# Patient Record
Sex: Male | Born: 1985
Health system: Southern US, Community
[De-identification: ages and names within clinical notes are randomized; demographics above are authoritative.]

## PROBLEM LIST (undated history)

## (undated) DIAGNOSIS — Z21 Asymptomatic human immunodeficiency virus [HIV] infection status: Secondary | ICD-10-CM

## (undated) DIAGNOSIS — Z9289 Personal history of other medical treatment: Secondary | ICD-10-CM

## (undated) DIAGNOSIS — B20 Human immunodeficiency virus [HIV] disease: Secondary | ICD-10-CM

## (undated) DIAGNOSIS — D649 Anemia, unspecified: Secondary | ICD-10-CM

## (undated) DIAGNOSIS — D638 Anemia in other chronic diseases classified elsewhere: Secondary | ICD-10-CM

## (undated) HISTORY — PX: ORIF FINGER / THUMB FRACTURE: SUR932

## (undated) HISTORY — PX: WRIST FRACTURE SURGERY: SHX121

## (undated) HISTORY — PX: FRACTURE SURGERY: SHX138

---

## 2016-03-02 ENCOUNTER — Emergency Department (INDEPENDENT_AMBULATORY_CARE_PROVIDER_SITE_OTHER)
Admission: EM | Admit: 2016-03-02 | Discharge: 2016-03-02 | Disposition: A | Discharge status: Home / Self Care | Payer: Self-pay | Source: Home / Self Care | Attending: Emergency Medicine | Admitting: Emergency Medicine

## 2016-03-02 ENCOUNTER — Encounter (HOSPITAL_COMMUNITY): Payer: Self-pay

## 2016-03-02 DIAGNOSIS — J40 Bronchitis, not specified as acute or chronic: Secondary | ICD-10-CM

## 2016-03-02 DIAGNOSIS — R05 Cough: Secondary | ICD-10-CM

## 2016-03-02 DIAGNOSIS — R059 Cough, unspecified: Secondary | ICD-10-CM

## 2016-03-02 MED ORDER — HYDROCODONE-HOMATROPINE 5-1.5 MG/5ML PO SYRP
5.0000 mL | ORAL_SOLUTION | Freq: Four times a day (QID) | ORAL | Status: DC | PRN
Start: 2016-03-02 — End: 2017-09-25

## 2016-03-02 MED ORDER — PREDNISONE 5 MG (21) PO TBPK: 5.0000 mg | ORAL_TABLET | Freq: Every day | ORAL | Status: DC

## 2016-03-02 MED ORDER — PREDNISOLONE 5 MG (21) PO TBPK: 5.0000 mg | ORAL_TABLET | ORAL | Status: DC

## 2016-03-02 NOTE — ED Notes (Signed)
Patient has has been feeling bad x5 days and has had a cough x2 days with chest pain with a slight fever No medication has ben taken. No acute distress Mom at bedside

## 2016-03-02 NOTE — Discharge Instructions (Signed)
Upper Respiratory Infection, Adult Most upper respiratory infections (URIs) are caused by a virus. A URI affects the nose, throat, and upper air passages. The most common type of URI is often called "the common cold." HOME CARE   Take medicines only as told by your doctor.  Gargle warm saltwater or take cough drops to comfort your throat as told by your doctor.  Use a warm mist humidifier or inhale steam from a shower to increase air moisture. This may make it easier to breathe.  Drink enough fluid to keep your pee (urine) clear or pale yellow.  Eat soups and other clear broths.  Have a healthy diet.  Rest as needed.  Go back to work when your fever is gone or your doctor says it is okay.  You may need to stay home longer to avoid giving your URI to others.  You can also wear a face mask and wash your hands often to prevent spread of the virus.  Use your inhaler more if you have asthma.  Do not use any tobacco products, including cigarettes, chewing tobacco, or electronic cigarettes. If you need help quitting, ask your doctor. GET HELP IF:  You are getting worse, not better.  Your symptoms are not helped by medicine.  You have chills.  You are getting more short of breath.  You have brown or red mucus.  You have yellow or brown discharge from your nose.  You have pain in your face, especially when you bend forward.  You have a fever.  You have puffy (swollen) neck glands.  You have pain while swallowing.  You have white areas in the back of your throat. GET HELP RIGHT AWAY IF:   You have very bad or constant:  Headache.  Ear pain.  Pain in your forehead, behind your eyes, and over your cheekbones (sinus pain).  Chest pain.  You have long-lasting (chronic) lung disease and any of the following:  Wheezing.  Long-lasting cough.  Coughing up blood.  A change in your usual mucus.  You have a stiff neck.  You have changes in  your:  Vision.  Hearing.  Thinking.  Mood. MAKE SURE YOU:   Understand these instructions.  Will watch your condition.  Will get help right away if you are not doing well or get worse.   This information is not intended to replace advice given to you by your health care provider. Make sure you discuss any questions you have with your health care provider.  You appear to have mild bronchitis. Will treat you with cough syrup as needed for night time and during the day Delsym (which is over the counter). Also mucinex max strength will help with congestion. Drink plenty of fluids. F/U here or emergency room if worsens.   Document Released: 06/03/2008 Document Revised: 05/02/2015 Document Reviewed: 03/23/2014 Elsevier Interactive Patient Education [employer]2016 Elsevier Inc.

## 2016-03-02 NOTE — ED Provider Notes (Signed)
CSN: [id number]     Arrival date & time 03/02/16  1716 History   First MD Initiated Contact with Patient 03/02/16 1820     Chief Complaint  Patient presents with  . Cough   (Consider location/radiation/quality/duration/timing/severity/associated sxs/prior Treatment) HPI Comments: Anthony Yang is a 30 yo black male who presents with a 5-6 day history of productive cough (mucus), and low grade temp. No nasal congestion or sore throat.   Patient is a 30 y.o. male presenting with cough. The history is provided by the patient.  Cough Associated symptoms: fever   Associated symptoms: no chills, no rhinorrhea and no shortness of breath     History reviewed. No pertinent past medical history. History reviewed. No pertinent past surgical history. No family history on file. Social History  Substance Use Topics  . Smoking status: Never Smoker   . Smokeless tobacco: Never Used  . Alcohol Use: No    Review of Systems  Constitutional: Positive for fever. Negative for chills and fatigue.  HENT: Negative for congestion, rhinorrhea and sinus pressure.   Respiratory: Positive for cough. Negative for shortness of breath.   Skin: Negative.   Allergic/Immunologic: Negative.     Allergies  Penicillins  Home Medications   Prior to Admission medications   Medication Sig Start Date End Date Taking? Authorizing Provider  HYDROcodone-homatropine (HYCODAN) 5-1.5 MG/5ML syrup Take 5 mLs by mouth every 6 (six) hours as needed for cough. 03/02/16   Riki SheerMichelle G Raihana Balderrama, PA-C  predniSONE (STERAPRED UNI-PAK 21 TAB) 5 MG (21) TBPK tablet Take 1 tablet (5 mg total) by mouth daily. 03/02/16   Riki SheerMichelle G Peace Jost, PA-C   Meds Ordered and Administered this Visit  Medications - No data to display  BP 121/80 mmHg  Pulse 94  Temp(Src) 99.9 F (37.7 C) (Oral)  Resp 18  SpO2 96% No data found.   Physical Exam  Constitutional: He is oriented to person, place, and time. He appears well-developed and well-nourished. No  distress.  HENT:  Head: Normocephalic and atraumatic.  Mouth/Throat: Oropharynx is clear and moist. No oropharyngeal exudate.  Eyes: Pupils are equal, round, and reactive to light.  Neck: Normal range of motion.  Pulmonary/Chest: Effort normal. He has wheezes.  Very mild expiratory wheezes in the bases. No crackles or rales  Neurological: He is alert and oriented to person, place, and time.  Skin: Skin is warm and dry. No rash noted. He is not diaphoretic.  Psychiatric: His behavior is normal.  Nursing note and vitals reviewed.   ED Course  Procedures (including critical care time)  Labs Review Labs Reviewed - No data to display  Imaging Review No results found.   Visual Acuity Review  Right Eye Distance:   Left Eye Distance:   Bilateral Distance:    Right Eye Near:   Left Eye Near:    Bilateral Near:         MDM   1. Bronchitis   2. Cough   Probable mild bronchitis is noted. No antibiotic indicated in a Anthony Yang otherwise healthy non-smoker. Treat with 6 day pred pack, cough medications and fluids. F/U here or ED if worsens.     Riki SheerMichelle G Sakiya Stepka, PA-C 03/02/16 1905

## 2017-09-22 ENCOUNTER — Ambulatory Visit (HOSPITAL_COMMUNITY)
Admission: EM | Admit: 2017-09-22 | Discharge: 2017-09-22 | Disposition: A | Discharge status: Home / Self Care | Payer: Medicaid Other | Attending: Family Medicine | Admitting: Family Medicine

## 2017-09-22 ENCOUNTER — Encounter (HOSPITAL_COMMUNITY): Payer: Self-pay | Admitting: Emergency Medicine

## 2017-09-22 DIAGNOSIS — Z880 Allergy status to penicillin: Secondary | ICD-10-CM | POA: Insufficient documentation

## 2017-09-22 DIAGNOSIS — R5383 Other fatigue: Secondary | ICD-10-CM

## 2017-09-22 DIAGNOSIS — R634 Abnormal weight loss: Secondary | ICD-10-CM | POA: Diagnosis not present

## 2017-09-22 DIAGNOSIS — R630 Anorexia: Secondary | ICD-10-CM

## 2017-09-22 LAB — TSH: TSH: 1.988 u[IU]/mL (ref 0.350–4.500)

## 2017-09-22 LAB — GLUCOSE, CAPILLARY: Glucose-Capillary: 109 mg/dL — ABNORMAL HIGH (ref 65–99)

## 2017-09-22 NOTE — ED Provider Notes (Signed)
  [facility]   [id number] 09/22/17 Arrival Time: 1110  ASSESSMENT & PLAN:  1. Fatigue, unspecified type   2. Decreased appetite    He feels symptoms are improving now that he can get regular sleep. Will f/u if he does not continue to improve. TSH pending.  Reviewed expectations re: course of current medical issues. Questions answered. Outlined signs and symptoms indicating need for more acute intervention. Patient verbalized understanding. After Visit Summary given.   SUBJECTIVE:  Anthony Yang is a 31 y.o. male who reports fatigue and feeling week for the past 2 months. 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 Is quitting job to work part time now. This week has been the first he's been on a normal schedule. Sleeping better and feels energy returning somewhat. Also reports decreased appetite over the past 1-2 months and weight loss. Appetite slowly returning now that he's rested. Reports DM runs in family. Desires "lab work."  ROS: As per HPI.   OBJECTIVE:  Vitals:   09/22/17 1252  BP: 119/73  Pulse: (!) 113  Resp: 20  Temp: 98.3 F (36.8 C)  TempSrc: Oral  SpO2: 100%    Recheck Pulse: 92  General appearance: alert; no distress Lungs: clear to auscultation bilaterally Heart: regular rate and rhythm Abdomen: soft, non-tender Skin: warm and dry Psychological: alert and cooperative; normal mood and affect  Labs: Labs Reviewed  GLUCOSE, CAPILLARY - Abnormal; Notable for the following:       Result Value   Glucose-Capillary 109 (*)    All other components within normal limits  TSH     Allergies  Allergen Reactions  . Penicillins Hives    Social History   Social History  . Marital status: Single    Spouse name: N/A  . Number of children: N/A  . Years of education: N/A   Occupational History  . Not on file.   Social History Main Topics  . Smoking status: Never Smoker  . Smokeless tobacco: Never Used  .  Alcohol use Yes  . Drug use: No  . Sexual activity: No   Other Topics Concern  . Not on file   Social History Narrative  . No narrative on file      Mardella Layman, MD 09/24/17 [phone]

## 2017-09-22 NOTE — ED Triage Notes (Signed)
Feeling weak and tired and says he has lost 60 pounds in 2 months due to not being able to eat and sleep normally

## 2017-09-24 ENCOUNTER — Inpatient Hospital Stay (HOSPITAL_COMMUNITY)
Admission: EM | Admit: 2017-09-24 | Discharge: 2017-10-02 | DRG: 974 | Disposition: A | Discharge status: Home / Self Care | Payer: Medicaid Other | Attending: Internal Medicine | Admitting: Internal Medicine

## 2017-09-24 ENCOUNTER — Encounter (HOSPITAL_COMMUNITY): Payer: Self-pay | Admitting: Emergency Medicine

## 2017-09-24 ENCOUNTER — Emergency Department (HOSPITAL_COMMUNITY): Payer: Medicaid Other

## 2017-09-24 DIAGNOSIS — J189 Pneumonia, unspecified organism: Secondary | ICD-10-CM

## 2017-09-24 DIAGNOSIS — E876 Hypokalemia: Secondary | ICD-10-CM | POA: Diagnosis present

## 2017-09-24 DIAGNOSIS — D638 Anemia in other chronic diseases classified elsewhere: Secondary | ICD-10-CM | POA: Diagnosis present

## 2017-09-24 DIAGNOSIS — E43 Unspecified severe protein-calorie malnutrition: Secondary | ICD-10-CM | POA: Insufficient documentation

## 2017-09-24 DIAGNOSIS — I959 Hypotension, unspecified: Secondary | ICD-10-CM | POA: Diagnosis present

## 2017-09-24 DIAGNOSIS — R109 Unspecified abdominal pain: Secondary | ICD-10-CM

## 2017-09-24 DIAGNOSIS — B20 Human immunodeficiency virus [HIV] disease: Secondary | ICD-10-CM | POA: Diagnosis present

## 2017-09-24 DIAGNOSIS — K922 Gastrointestinal hemorrhage, unspecified: Secondary | ICD-10-CM | POA: Diagnosis present

## 2017-09-24 DIAGNOSIS — Z9119 Patient's noncompliance with other medical treatment and regimen: Secondary | ICD-10-CM

## 2017-09-24 DIAGNOSIS — E871 Hypo-osmolality and hyponatremia: Secondary | ICD-10-CM | POA: Diagnosis present

## 2017-09-24 DIAGNOSIS — Z7952 Long term (current) use of systemic steroids: Secondary | ICD-10-CM

## 2017-09-24 DIAGNOSIS — D649 Anemia, unspecified: Secondary | ICD-10-CM | POA: Diagnosis present

## 2017-09-24 DIAGNOSIS — R64 Cachexia: Secondary | ICD-10-CM | POA: Diagnosis present

## 2017-09-24 DIAGNOSIS — R6521 Severe sepsis with septic shock: Secondary | ICD-10-CM | POA: Diagnosis present

## 2017-09-24 DIAGNOSIS — Z79891 Long term (current) use of opiate analgesic: Secondary | ICD-10-CM

## 2017-09-24 DIAGNOSIS — K580 Irritable bowel syndrome with diarrhea: Secondary | ICD-10-CM | POA: Diagnosis present

## 2017-09-24 DIAGNOSIS — A071 Giardiasis [lambliasis]: Secondary | ICD-10-CM

## 2017-09-24 DIAGNOSIS — D696 Thrombocytopenia, unspecified: Secondary | ICD-10-CM | POA: Diagnosis present

## 2017-09-24 DIAGNOSIS — B370 Candidal stomatitis: Secondary | ICD-10-CM

## 2017-09-24 DIAGNOSIS — B379 Candidiasis, unspecified: Secondary | ICD-10-CM | POA: Diagnosis present

## 2017-09-24 DIAGNOSIS — R000 Tachycardia, unspecified: Secondary | ICD-10-CM | POA: Diagnosis present

## 2017-09-24 DIAGNOSIS — R509 Fever, unspecified: Secondary | ICD-10-CM

## 2017-09-24 DIAGNOSIS — R0682 Tachypnea, not elsewhere classified: Secondary | ICD-10-CM | POA: Diagnosis present

## 2017-09-24 DIAGNOSIS — Z6822 Body mass index (BMI) 22.0-22.9, adult: Secondary | ICD-10-CM

## 2017-09-24 DIAGNOSIS — A310 Pulmonary mycobacterial infection: Secondary | ICD-10-CM | POA: Diagnosis present

## 2017-09-24 DIAGNOSIS — E861 Hypovolemia: Secondary | ICD-10-CM | POA: Diagnosis present

## 2017-09-24 DIAGNOSIS — K649 Unspecified hemorrhoids: Secondary | ICD-10-CM | POA: Diagnosis present

## 2017-09-24 DIAGNOSIS — Z91199 Patient's noncompliance with other medical treatment and regimen due to unspecified reason: Secondary | ICD-10-CM

## 2017-09-24 DIAGNOSIS — R0600 Dyspnea, unspecified: Secondary | ICD-10-CM

## 2017-09-24 DIAGNOSIS — E872 Acidosis: Secondary | ICD-10-CM | POA: Diagnosis present

## 2017-09-24 DIAGNOSIS — Z880 Allergy status to penicillin: Secondary | ICD-10-CM

## 2017-09-24 DIAGNOSIS — R601 Generalized edema: Secondary | ICD-10-CM | POA: Diagnosis present

## 2017-09-24 DIAGNOSIS — Z803 Family history of malignant neoplasm of breast: Secondary | ICD-10-CM

## 2017-09-24 DIAGNOSIS — A419 Sepsis, unspecified organism: Principal | ICD-10-CM | POA: Diagnosis present

## 2017-09-24 DIAGNOSIS — Z8719 Personal history of other diseases of the digestive system: Secondary | ICD-10-CM | POA: Diagnosis present

## 2017-09-24 HISTORY — DX: Anemia, unspecified: D64.9

## 2017-09-24 HISTORY — DX: Asymptomatic human immunodeficiency virus (hiv) infection status: Z21

## 2017-09-24 HISTORY — DX: Human immunodeficiency virus (HIV) disease: B20

## 2017-09-24 HISTORY — DX: Personal history of other medical treatment: Z92.89

## 2017-09-24 LAB — COMPREHENSIVE METABOLIC PANEL
ALT: 12 U/L — ABNORMAL LOW (ref 17–63)
ANION GAP: 10 (ref 5–15)
AST: 40 U/L (ref 15–41)
Albumin: 2.2 g/dL — ABNORMAL LOW (ref 3.5–5.0)
Alkaline Phosphatase: 66 U/L (ref 38–126)
BUN: 6 mg/dL (ref 6–20)
CALCIUM: 7.5 mg/dL — AB (ref 8.9–10.3)
CO2: 22 mmol/L (ref 22–32)
Chloride: 89 mmol/L — ABNORMAL LOW (ref 101–111)
Creatinine, Ser: 0.9 mg/dL (ref 0.61–1.24)
GFR calc non Af Amer: >60 mL/min (ref >60)
GLUCOSE: 127 mg/dL — AB (ref 65–99)
POTASSIUM: 4.7 mmol/L (ref 3.5–5.1)
SODIUM: 121 mmol/L — AB (ref 135–145)
TOTAL PROTEIN: 7.2 g/dL (ref 6.5–8.1)
Total Bilirubin: 1.3 mg/dL — ABNORMAL HIGH (ref 0.3–1.2)

## 2017-09-24 LAB — URINALYSIS, ROUTINE W REFLEX MICROSCOPIC
Bilirubin Urine: NEGATIVE
GLUCOSE, UA: NEGATIVE mg/dL
Hgb urine dipstick: NEGATIVE
Ketones, ur: NEGATIVE mg/dL
LEUKOCYTES UA: NEGATIVE
NITRITE: NEGATIVE
PH: 7 (ref 5.0–8.0)
Protein, ur: NEGATIVE mg/dL
SPECIFIC GRAVITY, URINE: 1.001 — AB (ref 1.005–1.030)

## 2017-09-24 LAB — CBC WITH DIFFERENTIAL/PLATELET
BASOS ABS: 0 10*3/uL (ref 0.0–0.1)
BASOS PCT: 0 %
Eosinophils Absolute: 0 10*3/uL (ref 0.0–0.7)
Eosinophils Relative: 0 %
HEMATOCRIT: 17.1 % — AB (ref 39.0–52.0)
HEMOGLOBIN: 5.4 g/dL — AB (ref 13.0–17.0)
LYMPHS PCT: 10 %
Lymphs Abs: 0.7 10*3/uL (ref 0.7–4.0)
MCH: 25 pg — ABNORMAL LOW (ref 26.0–34.0)
MCHC: 31.6 g/dL (ref 30.0–36.0)
MCV: 79.2 fL (ref 78.0–100.0)
MONOS PCT: 24 %
Monocytes Absolute: 1.5 10*3/uL — ABNORMAL HIGH (ref 0.1–1.0)
NEUTROS ABS: 4.3 10*3/uL (ref 1.7–7.7)
NEUTROS PCT: 66 %
Platelets: 180 10*3/uL (ref 150–400)
RBC: 2.16 MIL/uL — ABNORMAL LOW (ref 4.22–5.81)
RDW: 19.4 % — ABNORMAL HIGH (ref 11.5–15.5)
WBC: 6.6 10*3/uL (ref 4.0–10.5)

## 2017-09-24 LAB — I-STAT CG4 LACTIC ACID, ED: LACTIC ACID, VENOUS: 1.12 mmol/L (ref 0.5–1.9)

## 2017-09-24 MED ORDER — LEVOFLOXACIN IN D5W 750 MG/150ML IV SOLN
750.0000 mg | Freq: Once | INTRAVENOUS | Status: AC
  Administered 2017-09-25: 750 mg via INTRAVENOUS
  Filled 2017-09-24: qty 150

## 2017-09-24 MED ORDER — SODIUM CHLORIDE 0.9 % IV BOLUS (SEPSIS)
1000.0000 mL | Freq: Once | INTRAVENOUS | Status: AC
Start: 2017-09-24 — End: 2017-09-25
  Administered 2017-09-25: 1000 mL via INTRAVENOUS

## 2017-09-24 MED ORDER — SODIUM CHLORIDE 0.9 % IV SOLN: 10.0000 mL/h | Freq: Once | INTRAVENOUS | Status: DC

## 2017-09-24 MED ORDER — ACETAMINOPHEN 325 MG PO TABS
650.0000 mg | ORAL_TABLET | Freq: Once | ORAL | Status: AC
  Administered 2017-09-24: 650 mg via ORAL

## 2017-09-24 MED ORDER — SODIUM CHLORIDE 0.9 % IV SOLN
INTRAVENOUS | Status: DC
  Administered 2017-09-25 – 2017-09-26 (×4): via INTRAVENOUS

## 2017-09-24 MED ORDER — VANCOMYCIN HCL IN DEXTROSE 1-5 GM/200ML-% IV SOLN
1000.0000 mg | Freq: Once | INTRAVENOUS | Status: AC
  Administered 2017-09-25: 1000 mg via INTRAVENOUS
  Filled 2017-09-24: qty 200

## 2017-09-24 MED ORDER — DEXTROSE 5 % IV SOLN
2.0000 g | Freq: Once | INTRAVENOUS | Status: AC
  Administered 2017-09-25: 2 g via INTRAVENOUS
  Filled 2017-09-24: qty 2

## 2017-09-24 MED ORDER — SODIUM CHLORIDE 0.9 % IV BOLUS (SEPSIS)
500.0000 mL | Freq: Once | INTRAVENOUS | Status: AC
  Administered 2017-09-25: 500 mL via INTRAVENOUS

## 2017-09-24 MED ORDER — ACETAMINOPHEN 325 MG PO TABS
ORAL_TABLET | ORAL | Status: AC
  Filled 2017-09-24: qty 2

## 2017-09-24 NOTE — ED Notes (Signed)
Occult card Positive

## 2017-09-24 NOTE — ED Provider Notes (Signed)
TIME SEEN: 11:37 PM  CHIEF COMPLAINT: fatigue and weight loss  HPI: Pt is a 31 y.o. male with history of HIV who presents to the emergency department with complaints of fatigue and weight loss over the past 1-2 months. He states that he has lost 65 pounds unintentionally over the past month and a half.  He states he has felt "exhausted". He denies having any fever but is febrile in the emergency department today. He denies headache, neck pain or neck stiffness, chest pain or shortness of breath, vomiting or diarrhea, dysuria or hematuria, rash. Has had intermittent lower abdominal pain but none currently. Denies any blood in his stool or melena. No recent tick bite. No recent travel. No sick contacts. Family history of lung and breast cancer.  ROS: See HPI Constitutional:  fever  Eyes: no drainage  ENT: no runny nose   Cardiovascular:  no chest pain  Resp: no SOB  GI: no vomiting GU: no dysuria Integumentary: no rash  Allergy: no hives  Musculoskeletal: no leg swelling  Neurological: no slurred speech ROS otherwise negative  PAST MEDICAL HISTORY/PAST SURGICAL HISTORY:  History reviewed. No pertinent past medical history.  MEDICATIONS:  Prior to Admission medications   Medication Sig Start Date End Date Taking? Authorizing Provider  HYDROcodone-homatropine (HYCODAN) 5-1.5 MG/5ML syrup Take 5 mLs by mouth every 6 (six) hours as needed for cough. 03/02/16   Riki Sheer, PA-C  predniSONE (STERAPRED UNI-PAK 21 TAB) 5 MG (21) TBPK tablet Take 1 tablet (5 mg total) by mouth daily. 03/02/16   Riki Sheer, PA-C    ALLERGIES:  Allergies  Allergen Reactions  . Penicillins Hives    SOCIAL HISTORY:  Social History  Substance Use Topics  . Smoking status: Never Smoker  . Smokeless tobacco: Never Used  . Alcohol use Yes    FAMILY HISTORY: No family history on file.  EXAM: BP 120/66 (BP Location: Left Arm)   Pulse (!) 142   Temp (!) 103.1 F (39.5 C) (Oral)   Resp (!) 22    Ht  (1.676 m)   Wt 43.1 kg (95 lb)   SpO2 100%   BMI 15.33 kg/m  CONSTITUTIONAL: Alert and oriented and responds appropriately to questions.febrile but nontoxic and in no distress, seems restless and cannot sit still in bed HEAD: Normocephalic EYES: Conjunctivae clear, pupils appear equal, EOMI ENT: normal nose; dry mucous membranes NECK: Supple, no meningismus, no nuchal rigidity, no LAD  CARD: regular and tachycardic; S1 and S2 appreciated; no murmurs, no clicks, no rubs, no gallops RESP: Normal chest excursion without splinting or tachypnea; breath sounds clear and equal bilaterally; no wheezes, no rhonchi, no rales, no hypoxia or respiratory distress, speaking full sentences ABD/GI: Normal bowel sounds; non-distended; soft, non-tender, no rebound, no guarding, no peritoneal signs, no hepatosplenomegaly RECTAL:  Normal rectal tone, + small amount of gross red blood mixed with light brown stool, no melena, guaiac positive, no hemorrhoids appreciated, nontender rectal exam, no fecal impaction BACK:  The back appears normal and is non-tender to palpation, there is no CVA tenderness EXT: Normal ROM in all joints; non-tender to palpation; no edema; normal capillary refill; no cyanosis, no calf tenderness or swelling    SKIN: Normal color for age and race; warm; no rash NEURO: Moves all extremities equally PSYCH: The patient's mood and manner are appropriate. Grooming and personal hygiene are appropriate.  MEDICAL DECISION MAKING: Pt here with fatigue, weight loss. When I initially asked patient about HIV he  denies this but on review of his records it does appear he was previously followed at the new [facility] infectious disease clinic.  He is febrile here. He meets SIRs criteria. We'll start IV fluids and broad-spectrum antibiotics. Chest x-ray is clear. Urine shows no sign of infection. Lactate normal.  Patient has a sodium of the 121. He does appear dry on exam.  Will give gentle IV hydration.   Patient also found to have a hemoglobin of 5.4. He is guaiac positive but no hemorrhage. Normal blood pressure. Has been consented for 2 units of packed red blood cells. No thrombocytopenia.   I'm concerned that the patient has AIDS.  Will discuss with medicine for admission to step down. He does not have a primary care provider in this area.   ED PROGRESS:   12:18 AM Discussed patient's case with hospitalist, Dr. Antionette Char.  I have recommended admission and patient (and family if present) agree with this plan. Admitting physician will place admission orders.   I reviewed all nursing notes, vitals, pertinent previous records, EKGs, lab and urine results, imaging (as available).     EKG Interpretation  Date/Time:  Wednesday September 24 2017 22:16:15 EDT Ventricular Rate:  146 PR Interval:  136 QRS Duration: 66 QT Interval:  266 QTC Calculation: 414 R Axis:   82 Text Interpretation:  Sinus tachycardia No old tracing to compare Confirmed by Ward, Baxter Hire [phone]) on 09/25/2017 12:19:03 AM        CRITICAL CARE Performed by: Raelyn Number   Total critical care time: 45 minutes  Critical care time was exclusive of separately billable procedures and treating other patients.  Critical care was necessary to treat or prevent imminent or life-threatening deterioration.  Critical care was time spent personally by me on the following activities: development of treatment plan with patient and/or surrogate as well as nursing, discussions with consultants, evaluation of patient's response to treatment, examination of patient, obtaining history from patient or surrogate, ordering and performing treatments and interventions, ordering and review of laboratory studies, ordering and review of radiographic studies, pulse oximetry and re-evaluation of patient's condition.    Ward, Layla Maw, DO 09/25/17 0025

## 2017-09-24 NOTE — ED Triage Notes (Signed)
Patient reports weight loss, fatigue , generalized weakness and poor appetite for several weeks . Febrile at triage , no cough or urinary discomfort.

## 2017-09-25 ENCOUNTER — Encounter (HOSPITAL_COMMUNITY): Payer: Self-pay | Admitting: Family Medicine

## 2017-09-25 DIAGNOSIS — B370 Candidal stomatitis: Secondary | ICD-10-CM | POA: Diagnosis not present

## 2017-09-25 DIAGNOSIS — Z79891 Long term (current) use of opiate analgesic: Secondary | ICD-10-CM | POA: Diagnosis not present

## 2017-09-25 DIAGNOSIS — R509 Fever, unspecified: Secondary | ICD-10-CM | POA: Diagnosis not present

## 2017-09-25 DIAGNOSIS — Z91199 Patient's noncompliance with other medical treatment and regimen due to unspecified reason: Secondary | ICD-10-CM

## 2017-09-25 DIAGNOSIS — D509 Iron deficiency anemia, unspecified: Secondary | ICD-10-CM | POA: Insufficient documentation

## 2017-09-25 DIAGNOSIS — A419 Sepsis, unspecified organism: Secondary | ICD-10-CM | POA: Diagnosis present

## 2017-09-25 DIAGNOSIS — Z7952 Long term (current) use of systemic steroids: Secondary | ICD-10-CM | POA: Diagnosis not present

## 2017-09-25 DIAGNOSIS — Z9119 Patient's noncompliance with other medical treatment and regimen: Secondary | ICD-10-CM | POA: Diagnosis not present

## 2017-09-25 DIAGNOSIS — Z803 Family history of malignant neoplasm of breast: Secondary | ICD-10-CM | POA: Diagnosis not present

## 2017-09-25 DIAGNOSIS — B379 Candidiasis, unspecified: Secondary | ICD-10-CM | POA: Diagnosis present

## 2017-09-25 DIAGNOSIS — E871 Hypo-osmolality and hyponatremia: Secondary | ICD-10-CM | POA: Diagnosis present

## 2017-09-25 DIAGNOSIS — R000 Tachycardia, unspecified: Secondary | ICD-10-CM | POA: Diagnosis present

## 2017-09-25 DIAGNOSIS — Z6822 Body mass index (BMI) 22.0-22.9, adult: Secondary | ICD-10-CM | POA: Diagnosis not present

## 2017-09-25 DIAGNOSIS — K922 Gastrointestinal hemorrhage, unspecified: Secondary | ICD-10-CM

## 2017-09-25 DIAGNOSIS — E43 Unspecified severe protein-calorie malnutrition: Secondary | ICD-10-CM | POA: Diagnosis present

## 2017-09-25 DIAGNOSIS — J9600 Acute respiratory failure, unspecified whether with hypoxia or hypercapnia: Secondary | ICD-10-CM | POA: Diagnosis not present

## 2017-09-25 DIAGNOSIS — D649 Anemia, unspecified: Secondary | ICD-10-CM | POA: Diagnosis present

## 2017-09-25 DIAGNOSIS — R109 Unspecified abdominal pain: Secondary | ICD-10-CM | POA: Diagnosis not present

## 2017-09-25 DIAGNOSIS — R64 Cachexia: Secondary | ICD-10-CM | POA: Diagnosis present

## 2017-09-25 DIAGNOSIS — D696 Thrombocytopenia, unspecified: Secondary | ICD-10-CM | POA: Diagnosis present

## 2017-09-25 DIAGNOSIS — A071 Giardiasis [lambliasis]: Secondary | ICD-10-CM | POA: Diagnosis present

## 2017-09-25 DIAGNOSIS — D6489 Other specified anemias: Secondary | ICD-10-CM | POA: Diagnosis not present

## 2017-09-25 DIAGNOSIS — Z9289 Personal history of other medical treatment: Secondary | ICD-10-CM

## 2017-09-25 DIAGNOSIS — B20 Human immunodeficiency virus [HIV] disease: Secondary | ICD-10-CM | POA: Diagnosis present

## 2017-09-25 DIAGNOSIS — Z9911 Dependence on respirator [ventilator] status: Secondary | ICD-10-CM | POA: Diagnosis not present

## 2017-09-25 DIAGNOSIS — E876 Hypokalemia: Secondary | ICD-10-CM | POA: Diagnosis present

## 2017-09-25 DIAGNOSIS — J189 Pneumonia, unspecified organism: Secondary | ICD-10-CM | POA: Diagnosis not present

## 2017-09-25 DIAGNOSIS — E46 Unspecified protein-calorie malnutrition: Secondary | ICD-10-CM | POA: Diagnosis not present

## 2017-09-25 DIAGNOSIS — D638 Anemia in other chronic diseases classified elsewhere: Secondary | ICD-10-CM | POA: Diagnosis present

## 2017-09-25 DIAGNOSIS — Z79899 Other long term (current) drug therapy: Secondary | ICD-10-CM | POA: Diagnosis not present

## 2017-09-25 DIAGNOSIS — E861 Hypovolemia: Secondary | ICD-10-CM | POA: Diagnosis present

## 2017-09-25 DIAGNOSIS — I959 Hypotension, unspecified: Secondary | ICD-10-CM | POA: Diagnosis not present

## 2017-09-25 DIAGNOSIS — K580 Irritable bowel syndrome with diarrhea: Secondary | ICD-10-CM | POA: Diagnosis present

## 2017-09-25 DIAGNOSIS — Z8719 Personal history of other diseases of the digestive system: Secondary | ICD-10-CM | POA: Diagnosis present

## 2017-09-25 DIAGNOSIS — Z880 Allergy status to penicillin: Secondary | ICD-10-CM | POA: Diagnosis not present

## 2017-09-25 DIAGNOSIS — R6521 Severe sepsis with septic shock: Secondary | ICD-10-CM | POA: Diagnosis present

## 2017-09-25 DIAGNOSIS — R0600 Dyspnea, unspecified: Secondary | ICD-10-CM | POA: Diagnosis not present

## 2017-09-25 DIAGNOSIS — A310 Pulmonary mycobacterial infection: Secondary | ICD-10-CM | POA: Diagnosis present

## 2017-09-25 DIAGNOSIS — Z8619 Personal history of other infectious and parasitic diseases: Secondary | ICD-10-CM | POA: Diagnosis not present

## 2017-09-25 DIAGNOSIS — E872 Acidosis: Secondary | ICD-10-CM | POA: Diagnosis present

## 2017-09-25 HISTORY — DX: Personal history of other medical treatment: Z92.89

## 2017-09-25 LAB — RAPID URINE DRUG SCREEN, HOSP PERFORMED
AMPHETAMINES: NOT DETECTED
BENZODIAZEPINES: NOT DETECTED
Barbiturates: NOT DETECTED
Cocaine: NOT DETECTED
OPIATES: NOT DETECTED
Tetrahydrocannabinol: NOT DETECTED

## 2017-09-25 LAB — CBC WITH DIFFERENTIAL/PLATELET
BASOS ABS: 0 10*3/uL (ref 0.0–0.1)
Basophils Relative: 0 %
Eosinophils Absolute: 0 10*3/uL (ref 0.0–0.7)
Eosinophils Relative: 0 %
HCT: 23.5 % — ABNORMAL LOW (ref 39.0–52.0)
HEMOGLOBIN: 7.6 g/dL — AB (ref 13.0–17.0)
LYMPHS ABS: 0.3 10*3/uL — AB (ref 0.7–4.0)
LYMPHS PCT: 8 %
MCH: 25.4 pg — AB (ref 26.0–34.0)
MCHC: 32.3 g/dL (ref 30.0–36.0)
MCV: 78.6 fL (ref 78.0–100.0)
Monocytes Absolute: 0.7 10*3/uL (ref 0.1–1.0)
Monocytes Relative: 18 %
NEUTROS ABS: 3.1 10*3/uL (ref 1.7–7.7)
NEUTROS PCT: 74 %
Platelets: 152 10*3/uL (ref 150–400)
RBC: 2.99 MIL/uL — AB (ref 4.22–5.81)
RDW: 18.9 % — ABNORMAL HIGH (ref 11.5–15.5)
WBC: 4.2 10*3/uL (ref 4.0–10.5)

## 2017-09-25 LAB — BASIC METABOLIC PANEL
Anion gap: 9 (ref 5–15)
CALCIUM: 6.9 mg/dL — AB (ref 8.9–10.3)
CO2: 19 mmol/L — ABNORMAL LOW (ref 22–32)
CREATININE: 0.77 mg/dL (ref 0.61–1.24)
Chloride: 97 mmol/L — ABNORMAL LOW (ref 101–111)
GFR calc Af Amer: >60 mL/min (ref >60)
GLUCOSE: 134 mg/dL — AB (ref 65–99)
Potassium: 4 mmol/L (ref 3.5–5.1)
Sodium: 125 mmol/L — ABNORMAL LOW (ref 135–145)

## 2017-09-25 LAB — ABO/RH: ABO/RH(D): B POS

## 2017-09-25 LAB — IRON AND TIBC
Iron: 20 ug/dL — ABNORMAL LOW (ref 45–182)
SATURATION RATIOS: 9 % — AB (ref 17.9–39.5)
TIBC: 223 ug/dL — AB (ref 250–450)
UIBC: 203 ug/dL

## 2017-09-25 LAB — GLUCOSE, CAPILLARY
GLUCOSE-CAPILLARY: 87 mg/dL (ref 65–99)
GLUCOSE-CAPILLARY: 90 mg/dL (ref 65–99)
Glucose-Capillary: 100 mg/dL — ABNORMAL HIGH (ref 65–99)
Glucose-Capillary: 110 mg/dL — ABNORMAL HIGH (ref 65–99)

## 2017-09-25 LAB — MRSA PCR SCREENING: MRSA by PCR: NEGATIVE

## 2017-09-25 LAB — SODIUM, URINE, RANDOM: Sodium, Ur: <10 mmol/L

## 2017-09-25 LAB — RETICULOCYTES
RBC.: 2.01 MIL/uL — AB (ref 4.22–5.81)
RETIC CT PCT: 5.6 % — AB (ref 0.4–3.1)
Retic Count, Absolute: 112.6 10*3/uL (ref 19.0–186.0)

## 2017-09-25 LAB — APTT: APTT: 31 s (ref 24–36)

## 2017-09-25 LAB — PREPARE RBC (CROSSMATCH)

## 2017-09-25 LAB — VITAMIN B12: Vitamin B-12: 453 pg/mL (ref 180–914)

## 2017-09-25 LAB — T-HELPER CELLS (CD4) COUNT (NOT AT ARMC)
CD4 % Helper T Cell: 10 % — ABNORMAL LOW (ref 33–55)
CD4 T Cell Abs: 30 /uL — ABNORMAL LOW (ref 400–2700)

## 2017-09-25 LAB — PROTIME-INR
INR: 1.28
Prothrombin Time: 15.9 seconds — ABNORMAL HIGH (ref 11.4–15.2)

## 2017-09-25 LAB — FERRITIN: Ferritin: 4869 ng/mL — ABNORMAL HIGH (ref 24–336)

## 2017-09-25 LAB — OCCULT BLOOD, POC DEVICE: Fecal Occult Bld: POSITIVE — AB

## 2017-09-25 LAB — RPR: RPR Ser Ql: NONREACTIVE

## 2017-09-25 LAB — FOLATE: Folate: 17.2 ng/mL (ref >5.9)

## 2017-09-25 LAB — OSMOLALITY, URINE: Osmolality, Ur: 66 mOsm/kg — ABNORMAL LOW (ref 300–900)

## 2017-09-25 MED ORDER — HYDROCODONE-ACETAMINOPHEN 5-325 MG PO TABS
1.0000 | ORAL_TABLET | ORAL | Status: DC | PRN
  Administered 2017-09-26: 1 via ORAL
  Filled 2017-09-25: qty 1

## 2017-09-25 MED ORDER — VANCOMYCIN HCL 500 MG IV SOLR
500.0000 mg | Freq: Three times a day (TID) | INTRAVENOUS | Status: DC
  Filled 2017-09-25 (×2): qty 500

## 2017-09-25 MED ORDER — ONDANSETRON HCL 4 MG/2ML IJ SOLN: 4.0000 mg | Freq: Four times a day (QID) | INTRAMUSCULAR | Status: DC | PRN

## 2017-09-25 MED ORDER — DEXTROSE 5 % IV SOLN
1.0000 g | Freq: Three times a day (TID) | INTRAVENOUS | Status: DC
  Administered 2017-09-25 – 2017-09-27 (×7): 1 g via INTRAVENOUS
  Filled 2017-09-25 (×11): qty 1

## 2017-09-25 MED ORDER — VANCOMYCIN HCL 500 MG IV SOLR
500.0000 mg | Freq: Three times a day (TID) | INTRAVENOUS | Status: DC
  Administered 2017-09-25 – 2017-09-26 (×4): 500 mg via INTRAVENOUS
  Filled 2017-09-25 (×5): qty 500

## 2017-09-25 MED ORDER — SENNOSIDES-DOCUSATE SODIUM 8.6-50 MG PO TABS
1.0000 | ORAL_TABLET | Freq: Every evening | ORAL | Status: DC | PRN
  Filled 2017-09-25: qty 1

## 2017-09-25 MED ORDER — ACETAMINOPHEN 325 MG PO TABS
650.0000 mg | ORAL_TABLET | Freq: Four times a day (QID) | ORAL | Status: DC | PRN
  Administered 2017-09-27 – 2017-10-01 (×4): 650 mg via ORAL
  Filled 2017-09-25 (×4): qty 2

## 2017-09-25 MED ORDER — PANTOPRAZOLE SODIUM 40 MG IV SOLR
40.0000 mg | Freq: Two times a day (BID) | INTRAVENOUS | Status: DC
Start: 2017-09-25 — End: 2017-09-26
  Administered 2017-09-25 – 2017-09-26 (×4): 40 mg via INTRAVENOUS
  Filled 2017-09-25 (×4): qty 40

## 2017-09-25 MED ORDER — BISACODYL 5 MG PO TBEC: 5.0000 mg | DELAYED_RELEASE_TABLET | Freq: Every day | ORAL | Status: DC | PRN

## 2017-09-25 MED ORDER — BOOST / RESOURCE BREEZE PO LIQD
1.0000 | Freq: Three times a day (TID) | ORAL | Status: DC
  Administered 2017-09-25 – 2017-10-01 (×12): 1 via ORAL
  Filled 2017-09-25 (×12): qty 1

## 2017-09-25 MED ORDER — LORAZEPAM 1 MG PO TABS
0.5000 mg | ORAL_TABLET | ORAL | Status: DC | PRN
  Administered 2017-09-25 – 2017-09-26 (×2): 1 mg via ORAL
  Filled 2017-09-25 (×2): qty 1

## 2017-09-25 MED ORDER — ACETAMINOPHEN 650 MG RE SUPP: 650.0000 mg | Freq: Four times a day (QID) | RECTAL | Status: DC | PRN

## 2017-09-25 MED ORDER — SODIUM CHLORIDE 0.9% FLUSH
3.0000 mL | Freq: Two times a day (BID) | INTRAVENOUS | Status: DC
Start: 2017-09-25 — End: 2017-10-02
  Administered 2017-09-25 – 2017-09-26 (×4): 3 mL via INTRAVENOUS

## 2017-09-25 MED ORDER — LEVOFLOXACIN IN D5W 750 MG/150ML IV SOLN
750.0000 mg | INTRAVENOUS | Status: DC
  Administered 2017-09-25 – 2017-09-28 (×4): 750 mg via INTRAVENOUS
  Filled 2017-09-25 (×4): qty 150

## 2017-09-25 MED ORDER — HEPARIN SODIUM (PORCINE) 5000 UNIT/ML IJ SOLN
5000.0000 [IU] | Freq: Three times a day (TID) | INTRAMUSCULAR | Status: DC
  Administered 2017-09-25 – 2017-09-26 (×4): 5000 [IU] via SUBCUTANEOUS
  Filled 2017-09-25 (×4): qty 1

## 2017-09-25 MED ORDER — SODIUM CHLORIDE 0.9 % IV SOLN
Freq: Once | INTRAVENOUS | Status: AC
  Administered 2017-09-25: 06:00:00 via INTRAVENOUS

## 2017-09-25 MED ORDER — ONDANSETRON HCL 4 MG PO TABS
4.0000 mg | ORAL_TABLET | Freq: Four times a day (QID) | ORAL | Status: DC | PRN
Start: 2017-09-25 — End: 2017-10-02

## 2017-09-25 NOTE — Progress Notes (Signed)
Pharmacy Antibiotic Note  Anthony Yang is a 31 y.o. male admitted on 09/24/2017 with sepsis.  Pharmacy has been consulted for Vancomycin, Aztreonam, and Levaquin dosing. Pt has hx of HIV and has been off of his medications since early 2016. Since then, has had significant weight loss. Presents to the ED with that weight loss as well as fatigue/malaise. WBC WNL. Renal function good. Febrile up to 103.1.   Plan: Vancomycin 500 mg IV q8h Aztreonam 1g IV q8h Levaquin 750 mg IV q24h Trend WBC, temp, renal function  F/U infectious work-up Drug levels as indicated   Height:  (167.6 cm) Weight: 95 lb (43.1 kg) IBW/kg (Calculated) : 63.8  Temp (24hrs), Avg:102 F (38.9 C), Min:100.8 F (38.2 C), Max:103.1 F (39.5 C)   Recent Labs Lab 09/24/17 2227 09/24/17 2229  WBC 6.6  --   CREATININE 0.90  --   LATICACIDVEN  --  1.12    Estimated Creatinine Clearance: 72.5 mL/min (by C-G formula based on SCr of 0.9 mg/dL).    Allergies  Allergen Reactions  . Penicillins Hives    Anthony Yang 09/25/2017 1:25 AM

## 2017-09-25 NOTE — H&P (Signed)
History and Physical    NICOLAE VASEK ZOX:[id number] DOB: 30-Apr-1986 DOA: 09/24/2017  PCP: Patient, No Pcp Per   Patient coming from: Home  Chief Complaint: Fatigue, malaise, wt-loss   HPI: Anthony Yang is a 31 y.o. male with medical history significant for HIV/AIDS, lost to follow-up within 2 years ago, now presenting to the emergency department with 2 months of fatigue, generalized weakness, loss of appetite, and malaise. He reports significant weight loss over this interval. Reports occasional chills, but denies fevers. Reports occasional mild crampy lower abdominal pain, but denies dysuria or flank pain. Denies melena or hematochezia. Patient denies chest pain, palpitations, cough, or dyspnea. No rash or wound. No neck stiffness, headache, change in vision or hearing, or focal numbness or weakness. Denies any recent long distance travel or sick contacts. Denies any use of illicit substances. Reports occasional, social use of alcohol. He reports that he is in the process of transferring his HIV care to [facility], but acknowledges that he is not taking any medications for this since early 2016.   ED Course: Upon arrival to the ED, patient is found to be febrile to 39.5 C, saturating well on room air, tachycardic in the 130s, and with stable blood pressure. EKG features a sinus tachycardia with rate 146. Chest x-ray is negative for acute cardiopulmonary disease. Chemistry panel reveals a sodium of 121 and albumin of 2.2. CBC is notable for hemoglobin of 5.4. Lactic acid is reassuring at 1.12 and urinalysis is notable for a low specific gravity. Blood cultures were obtained in the emergency department, 30 cc/kg normal saline bolus was given, and the patient was treated with empiric vancomycin, Levaquin, and as active in the setting of penicillin allergy. 2 units packed red blood cells were ordered for immediate transfusion. Blood pressure remained stable, patient has not been in any apparent respiratory  distress, he remains very tachycardic, and will be admitted to the stepdown unit for ongoing evaluation and management of service in a HIV/AIDS patient that has been untreated for more than 2 years, and also for symptomatic anemia.   Review of Systems:  All other systems reviewed and apart from HPI, are negative.  History reviewed. No pertinent past medical history.  History reviewed. No pertinent surgical history.   reports that he has never smoked. He has never used smokeless tobacco. He reports that he drinks alcohol. He reports that he does not use drugs.  Allergies  Allergen Reactions  . Penicillins Hives    History reviewed. No pertinent family history.   Prior to Admission medications   Medication Sig Start Date End Date Taking? Authorizing Provider  HYDROcodone-homatropine (HYCODAN) 5-1.5 MG/5ML syrup Take 5 mLs by mouth every 6 (six) hours as needed for cough. 03/02/16   Riki Sheer, PA-C  predniSONE (STERAPRED UNI-PAK 21 TAB) 5 MG (21) TBPK tablet Take 1 tablet (5 mg total) by mouth daily. 03/02/16   Riki Sheer, PA-C    Physical Exam: Vitals:   09/24/17 2213 09/24/17 2342 09/24/17 2343  BP: 120/66 120/72   Pulse: (!) 142  (!) 138  Resp: (!) 22 20 (!) 26  Temp: (!) 103.1 F (39.5 C)    TempSrc: Oral    SpO2: 100%  100%  Weight: 43.1 kg (95 lb)    Height:  (1.676 m)        Constitutional: No respiratory distress, akathesis  Eyes: PERTLA, lids and conjunctivae normal ENMT: Mucous membranes are pale. Posterior pharynx clear of any exudate  or lesions.   Neck: normal, supple, no masses, no thyromegaly Respiratory: clear to auscultation bilaterally, no wheezing, no crackles. Normal respiratory effort.  Cardiovascular: Rate ~130 and regular. No extremity edema. No significant JVD. Abdomen: No distension, no tenderness, soft. Bowel sounds normal.  Musculoskeletal: no clubbing / cyanosis. No joint deformity upper and lower extremities.   Skin: no  significant rashes, lesions, ulcers. Poor turgor. Neurologic: CN 2-12 grossly intact. Sensation intact. Strength 5/5 in all 4 limbs.  Psychiatric: Alert and oriented x 3. Restless, lip-smacking.     Labs on Admission: I have personally reviewed following labs and imaging studies  CBC:  Recent Labs Lab 09/24/17 2227  WBC 6.6  NEUTROABS 4.3  HGB 5.4*  HCT 17.1*  MCV 79.2  PLT 180   Basic Metabolic Panel:  Recent Labs Lab 09/24/17 2227  NA 121*  K 4.7  CL 89*  CO2 22  GLUCOSE 127*  BUN 6  CREATININE 0.90  CALCIUM 7.5*   GFR: Estimated Creatinine Clearance: 72.5 mL/min (by C-G formula based on SCr of 0.9 mg/dL). Liver Function Tests:  Recent Labs Lab 09/24/17 2227  AST 40  ALT 12*  ALKPHOS 66  BILITOT 1.3*  PROT 7.2  ALBUMIN 2.2*   No results for input(s): LIPASE, AMYLASE in the last 168 hours. No results for input(s): AMMONIA in the last 168 hours. Coagulation Profile: No results for input(s): INR, PROTIME in the last 168 hours. Cardiac Enzymes: No results for input(s): CKTOTAL, CKMB, CKMBINDEX, TROPONINI in the last 168 hours. BNP (last 3 results) No results for input(s): PROBNP in the last 8760 hours. HbA1C: No results for input(s): HGBA1C in the last 72 hours. CBG:  Recent Labs Lab 09/22/17 1329  GLUCAP 109*   Lipid Profile: No results for input(s): CHOL, HDL, LDLCALC, TRIG, CHOLHDL, LDLDIRECT in the last 72 hours. Thyroid Function Tests:  Recent Labs  09/22/17 1405  TSH 1.988   Anemia Panel: No results for input(s): VITAMINB12, FOLATE, FERRITIN, TIBC, IRON, RETICCTPCT in the last 72 hours. Urine analysis:    Component Value Date/Time   COLORURINE STRAW (A) 09/24/2017 2249   APPEARANCEUR CLEAR 09/24/2017 2249   LABSPEC 1.001 (L) 09/24/2017 2249   PHURINE 7.0 09/24/2017 2249   GLUCOSEU NEGATIVE 09/24/2017 2249   HGBUR NEGATIVE 09/24/2017 2249   BILIRUBINUR NEGATIVE 09/24/2017 2249   KETONESUR NEGATIVE 09/24/2017 2249   PROTEINUR  NEGATIVE 09/24/2017 2249   NITRITE NEGATIVE 09/24/2017 2249   LEUKOCYTESUR NEGATIVE 09/24/2017 2249   Sepsis Labs: (procalcitonin:4,lacticidven:4) )No results found for this or any previous visit (from the past 240 hour(s)).   Radiological Exams on Admission: Dg Chest 2 View  Result Date: 09/24/2017 CLINICAL DATA:  Fever fatigue and weight loss EXAM: CHEST  2 VIEW COMPARISON:  None. FINDINGS: The heart size and mediastinal contours are within normal limits. Both lungs are clear. The visualized skeletal structures are unremarkable. Artifact over the right chest. IMPRESSION: No active cardiopulmonary disease. Electronically Signed   By: Jasmine Pang M.D.   On: 09/24/2017 23:22    EKG: Independently reviewed. Sinus tachycardia (rate 146).   Assessment/Plan  1. Symptomatic anemia  - Pt presents with insidious development of fatigue, gen weakness, found to have Hgb 5.4  - Denies melena or hematochezia, denies nausea/vomiting, FOBT is positive  - Treated with 2 units RBC's in ED, will check post-transfusion H&H, add anemia panel on to pre-transfusion labs  - Start Protonix 40 mg IV BID    2. SIRS  - Presents with fever,  tachycardia, mild tachypnea  - CXR clear, no respiratory s/s, UA not suggestive of infection, abd exam benign, no meningismus, no wound  - Blood cultures were collected in ED, 30 cc/kg NS was given, and empiric vancomycin, Levaquin, and Azactam was administered  - Plan to continue empiric broad-spectrums, add Diflucan given his untreated HIV, no respiratory s/s   - Follow cultures, check CD4 count  3. AIDS  - Pt was previously under the care of ID in [place], [place]   - He reports being in process of transferring care to [facility], but acknowledges going without medications since 2016  - Now presenting with fevers concerning for opportunistic infection; no respiratory or neuro s/s on admission  - Check CD4 and VL  - He was treated with vanc, Azactam, and Levaquin in  ED, will add Diflucan  - Consider ID consultation in am   4. Hyponatremia  - Serum sodium is 121 on admission in setting of hypovolemia  - Treated with 1.5 liters NS in ED  - Check urine sodium, continue NS infusion, follow serial chem panels     DVT prophylaxis: SCD's Code Status: Full  Family Communication: Discussed with patient Disposition Plan: Admit to SDU Consults called: None Admission status: Inpatient    Briscoe Deutscher, MD [facility] Pager [phone]  If 7PM-7AM, please contact night-coverage [email or web address] Password [facility]  09/25/2017, 12:45 AM

## 2017-09-25 NOTE — Progress Notes (Signed)
PROGRESS NOTE    Anthony Yang  YNW:[id number] DOB: 19-Oct-1986 DOA: 09/24/2017 PCP: Patient, No Pcp Per   Brief Narrative:  31 y.o. BM PMHx HIV/AIDS, lost to follow-up within 2 years ago,   Presenting to the emergency department with 2 months of fatigue, generalized weakness, loss of appetite, and malaise. He reports significant weight loss over this interval. Reports occasional chills, but denies fevers. Reports occasional mild crampy lower abdominal pain, but denies dysuria or flank pain. Denies melena or hematochezia. Patient denies chest pain, palpitations, cough, or dyspnea. No rash or wound. No neck stiffness, headache, change in vision or hearing, or focal numbness or weakness. Denies any recent long distance travel or sick contacts. Denies any use of illicit substances. Reports occasional, social use of alcohol. He reports that he is in the process of transferring his HIV care to [facility], but acknowledges that he is not taking any medications for this since early 2016.    ED Course: Upon arrival to the ED, patient is found to be febrile to 39.5 C, saturating well on room air, tachycardic in the 130s, and with stable blood pressure. EKG features a sinus tachycardia with rate 146. Chest x-ray is negative for acute cardiopulmonary disease. Chemistry panel reveals a sodium of 121 and albumin of 2.2. CBC is notable for hemoglobin of 5.4. Lactic acid is reassuring at 1.12 and urinalysis is notable for a low specific gravity. Blood cultures were obtained in the emergency department, 30 cc/kg normal saline bolus was given, and the patient was treated with empiric vancomycin, Levaquin, and as active in the setting of penicillin allergy. 2 units packed red blood cells were ordered for immediate transfusion. Blood pressure remained stable, patient has not been in any apparent respiratory distress, he remains very tachycardic, and will be admitted to the stepdown unit for ongoing evaluation and management of  service in a HIV/AIDS patient that has been untreated for more than 2 years, and also for symptomatic anemia.     Subjective: 9/27 A/O 4, negative CP, negative SOB, negative N/V, negative abdominal pain.negative headache, negative neck pain, positive fatigue. Negative cough.States infectious disease physician was in [place], does not recall his name. Has not been on medication for 2 years. Does not recall the name of the medication he was on 2 years ago.    Assessment & Plan:   Principal Problem:   Symptomatic anemia Active Problems:   Sepsis, unspecified organism (HCC)   Hyponatremia   GI bleeding   AIDS (HCC)   Non-compliance  Symptomatic anemia  -on admission hemoglobin 5.4 -Fecal occult blood positive -anemia panel pending -9/26 transfuse 2 units PRBC -Protonix  BID -1.5 L normal saline ED: Continue normal saline 126ml/hr -Consult GI on 9/28   Sepsis unspecified organism -upon admission meets criteria for SIRS> Temperature>38C, HR> 90, RR. 20. Negative lactic acid, negative source of infection -empiric antibiotics    AIDS  -HIV quant pending:Last HIV [facility]2/22 [phone] -CD4 count pending: Last CD4 count from [facility] 02/20/2015= 25 -Acute Hepatitis panel pending -last infectious disease practitioner seen appears to be NP Eliot Ford (ID) [facility] [id number]/132016 may have started Genvoya. -Consult infectious disease on 9/28   Hyponatremia  -Serum sodium is 121 on admission in setting of hypovolemia  -1.5 L normal saline ED: Continue normal saline 156ml/hr     DVT prophylaxis:subcutaneous heparin Code Status: full Family Communication: mother present for discussion of plan of care Disposition Plan:  TBD   Consultants:  None  Procedures/Significant Events:  None   I have personally reviewed and interpreted all radiology studies and my findings are as  above.  VENTILATOR SETTINGS: None    Cultures  9/26 blood pending 9/27 MRSA by PCR negative 9/27 urine pending 9/27 CD4 pending 9/27Acute hepatitis panel pending 9/27HIV RNA quantity pending 9/27 sputum pending 9/27PCP smear pending    Antimicrobials: Anti-infectives    Start     Dose/Rate Stop   09/25/17 2200  levofloxacin (LEVAQUIN) IVPB 750 mg     750 mg 100 mL/hr over 90 Minutes     09/25/17 0830  vancomycin (VANCOCIN) 500 mg in sodium chloride 0.9 % 100 mL IVPB     500 mg 100 mL/hr over 60 Minutes     09/25/17 0600  vancomycin (VANCOCIN) 500 mg in sodium chloride 0.9 % 100 mL IVPB  Status:  Discontinued     500 mg 100 mL/hr over 60 Minutes 09/25/17 0800   09/25/17 0600  aztreonam (AZACTAM) 1 g in dextrose 5 % 50 mL IVPB     1 g 100 mL/hr over 30 Minutes     09/24/17 2345  levofloxacin (LEVAQUIN) IVPB 750 mg     750 mg 100 mL/hr over 90 Minutes 09/25/17 0219   09/24/17 2345  aztreonam (AZACTAM) 2 g in dextrose 5 % 50 mL IVPB     2 g 100 mL/hr over 30 Minutes 09/25/17 0131   09/24/17 2345  vancomycin (VANCOCIN) IVPB 1000 mg/200 mL premix     1,000 mg 200 mL/hr over 60 Minutes 09/25/17 0219       Devices None   LINES / TUBES:  None    Continuous Infusions: . sodium chloride 125 mL/hr at 09/25/17 0550  . aztreonam    . levofloxacin (LEVAQUIN) IV    . vancomycin       Objective: Vitals:   09/25/17 0555 09/25/17 0643 09/25/17 0800 09/25/17 0830  BP: 105/77 109/75 100/73 104/74  Pulse: 93 (!) 101 90   Resp: (!) 22 17 (!) 23   Temp: 97.6 F (36.4 C)  97.7 F (36.5 C) 97.6 F (36.4 C)  TempSrc: Oral  Oral Oral  SpO2: 100% (!) 67% 100%   Weight:  118 lb 9.7 oz (53.8 kg)    Height:   (1.676 m)      Intake/Output Summary (Last 24 hours) at 09/25/17 0845 Last data filed at 09/25/17 0830  Gross per 24 hour  Intake             2870 ml  Output             2200 ml  Net              670 ml   Filed Weights   09/24/17 2213 09/25/17 0643   Weight: 95 lb (43.1 kg) 118 lb 9.7 oz (53.8 kg)    Examination:  General: A/O 4,No acute respiratory distress, cachectic Neck:  Negative scars, masses, torticollis, lymphadenopathy, JVD Lungs: Clear to auscultation bilaterally without wheezes or crackles Cardiovascular: tachycardic,Regular rhythm without murmur gallop or rub normal S1 and S2 Abdomen: negative abdominal pain, nondistended, positive soft, bowel sounds, no rebound, no ascites, no appreciable mass Extremities: No significant cyanosis, clubbing, or edema bilateral lower extremities Skin: Negative rashes, lesions, ulcers:  Psychiatric:  Negative depression, negative anxiety, negative fatigue, negative mania  Central nervous system:  Cranial nerves II through XII intact, tongue/uvula midline, all extremities muscle strength 5/5, sensation  intact throughout,  negative dysarthria, negative expressive aphasia, negative receptive aphasia.  .     Data Reviewed: Care during the described time interval was provided by me .  I have reviewed this patient's available data, including medical history, events of note, physical examination, and all test results as part of my evaluation.   CBC:  Recent Labs Lab 09/24/17 2227  WBC 6.6  NEUTROABS 4.3  HGB 5.4*  HCT 17.1*  MCV 79.2  PLT 180   Basic Metabolic Panel:  Recent Labs Lab 09/24/17 2227 09/25/17 0235  NA 121* 125*  K 4.7 4.0  CL 89* 97*  CO2 22 19*  GLUCOSE 127* 134*  BUN 6 <5*  CREATININE 0.90 0.77  CALCIUM 7.5* 6.9*   GFR: Estimated Creatinine Clearance: 101.8 mL/min (by C-G formula based on SCr of 0.77 mg/dL). Liver Function Tests:  Recent Labs Lab 09/24/17 2227  AST 40  ALT 12*  ALKPHOS 66  BILITOT 1.3*  PROT 7.2  ALBUMIN 2.2*   No results for input(s): LIPASE, AMYLASE in the last 168 hours. No results for input(s): AMMONIA in the last 168 hours. Coagulation Profile:  Recent Labs Lab 09/25/17 0006  INR 1.28   Cardiac Enzymes: No results  for input(s): CKTOTAL, CKMB, CKMBINDEX, TROPONINI in the last 168 hours. BNP (last 3 results) No results for input(s): PROBNP in the last 8760 hours. HbA1C: No results for input(s): HGBA1C in the last 72 hours. CBG:  Recent Labs Lab 09/22/17 1329 09/25/17 0823  GLUCAP 109* 100*   Lipid Profile: No results for input(s): CHOL, HDL, LDLCALC, TRIG, CHOLHDL, LDLDIRECT in the last 72 hours. Thyroid Function Tests:  Recent Labs  09/22/17 1405  TSH 1.988   Anemia Panel:  Recent Labs  09/25/17 0130 09/25/17 0235  VITAMINB12  --  453  FOLATE 17.2  --   FERRITIN  --  4,869*  TIBC  --  223*  IRON  --  20*  RETICCTPCT  --  5.6*   Urine analysis:    Component Value Date/Time   COLORURINE STRAW (A) 09/24/2017 2249   APPEARANCEUR CLEAR 09/24/2017 2249   LABSPEC 1.001 (L) 09/24/2017 2249   PHURINE 7.0 09/24/2017 2249   GLUCOSEU NEGATIVE 09/24/2017 2249   HGBUR NEGATIVE 09/24/2017 2249   BILIRUBINUR NEGATIVE 09/24/2017 2249   KETONESUR NEGATIVE 09/24/2017 2249   PROTEINUR NEGATIVE 09/24/2017 2249   NITRITE NEGATIVE 09/24/2017 2249   LEUKOCYTESUR NEGATIVE 09/24/2017 2249   Sepsis Labs: (procalcitonin:4,lacticidven:4)  )No results found for this or any previous visit (from the past 240 hour(s)).       Radiology Studies: Dg Chest 2 View  Result Date: 09/24/2017 CLINICAL DATA:  Fever fatigue and weight loss EXAM: CHEST  2 VIEW COMPARISON:  None. FINDINGS: The heart size and mediastinal contours are within normal limits. Both lungs are clear. The visualized skeletal structures are unremarkable. Artifact over the right chest. IMPRESSION: No active cardiopulmonary disease. Electronically Signed   By: Jasmine Pang M.D.   On: 09/24/2017 23:22        Scheduled Meds: . pantoprazole (PROTONIX) IV  40 mg Intravenous Q12H  . sodium chloride flush  3 mL Intravenous Q12H   Continuous Infusions: . sodium chloride 125 mL/hr at 09/25/17 0550  . aztreonam    .  levofloxacin (LEVAQUIN) IV    . vancomycin       LOS: 0 days    Time spent: 40 minutes    Jeraline Marcinek, Roselind Messier, MD [facility] Pager [phone]  If 7PM-7AM, please contact night-coverage [email or web address] Password [email or web address] 09/25/2017, 8:45 AM

## 2017-09-25 NOTE — Care Management Note (Addendum)
Case Management Note  Patient Details  Name: Anthony Yang MRN: [id number] Date of Birth: [birth year]  Subjective/Objective:   From home with parents, pta indep, sepsis , he has no insurance or no PCP, NCM [facility] at [facility], gave patient brochure for this and also brochure for the [facility] for med ast, if dc during the week, if dc on weekend will need ast with Match Letter from [facility].                   Action/Plan: NCM will follow for dc needs.   Expected Discharge Date:                  Expected Discharge Plan:  Home/Self Care  In-House Referral:     Discharge planning Services  CM Consult, Indigent [facility] Clinic, Follow-up appt scheduled, Medication Assistance  Post Acute Care Choice:    Choice offered to:     DME Arranged:    DME Agency:     HH Arranged:    HH Agency:     Status of Service:  Completed, signed off  If discussed at [employer], dates discussed:    Additional Comments:  Leone Haven, RN 09/25/2017, 5:06 PM

## 2017-09-26 LAB — HEPATITIS PANEL, ACUTE
HEP A IGM: NEGATIVE
HEP B C IGM: NEGATIVE
Hepatitis B Surface Ag: NEGATIVE

## 2017-09-26 LAB — HIV-1 RNA QUANT-NO REFLEX-BLD
HIV 1 RNA QUANT: 604000 {copies}/mL
LOG10 HIV-1 RNA: 5.781 log10copy/mL

## 2017-09-26 LAB — HIV 1/2 AB DIFFERENTIATION
HIV 1 AB: POSITIVE — AB
HIV 2 AB: NEGATIVE

## 2017-09-26 LAB — GLUCOSE, CAPILLARY: Glucose-Capillary: 103 mg/dL — ABNORMAL HIGH (ref 65–99)

## 2017-09-26 LAB — CD4/CD8 (T-HELPER/T-SUPPRESSOR CELL)
CD4 absolute: 12 /uL — ABNORMAL LOW (ref 500–1900)
CD4%: 4 % — AB (ref 30.0–60.0)
CD8 T Cell Abs: 123 /uL — ABNORMAL LOW (ref 230–1000)
CD8tox: 45 % — ABNORMAL HIGH (ref 15.0–40.0)
RATIO: 0.1 — AB (ref 1.0–3.0)
Total lymphocyte count: 270 /uL — ABNORMAL LOW (ref 1000–4000)

## 2017-09-26 LAB — HISTOPLASMA ANTIGEN, URINE: Histoplasma Antigen, urine: <0.5 (ref <0.5)

## 2017-09-26 LAB — HIV ANTIBODY (ROUTINE TESTING W REFLEX): HIV SCREEN 4TH GENERATION: REACTIVE — AB

## 2017-09-26 MED ORDER — TRAMADOL HCL 50 MG PO TABS: 50.0000 mg | ORAL_TABLET | Freq: Four times a day (QID) | ORAL | Status: DC | PRN

## 2017-09-26 MED ORDER — PANTOPRAZOLE SODIUM 40 MG PO TBEC
40.0000 mg | DELAYED_RELEASE_TABLET | Freq: Two times a day (BID) | ORAL | Status: DC
  Administered 2017-09-26 – 2017-10-02 (×12): 40 mg via ORAL
  Filled 2017-09-26 (×12): qty 1

## 2017-09-26 MED ORDER — HYDROCODONE-ACETAMINOPHEN 5-325 MG PO TABS: 1.0000 | ORAL_TABLET | ORAL | Status: DC | PRN

## 2017-09-26 MED ORDER — VANCOMYCIN HCL IN DEXTROSE 750-5 MG/150ML-% IV SOLN
750.0000 mg | Freq: Three times a day (TID) | INTRAVENOUS | Status: DC
  Administered 2017-09-26: 750 mg via INTRAVENOUS
  Filled 2017-09-26 (×2): qty 150

## 2017-09-26 NOTE — Progress Notes (Signed)
Pharmacy Antibiotic Note  Anthony Yang is a 31 y.o. male admitted on 09/24/2017 with fatigue, malaise, and weight loss concerning for sepsis.  Pharmacy has been consulted for Vancomycin, Aztreonam, and Levaquin dosing.   Given the updated weight and current renal function - will adjust antibiotics accordingly. Noted plans to consult ID this admit for HIV management - will follow-up plans for antibiotics. Symptoms on admission could have been related to symptomatic anemia (Hgb 5.4) - consider d/cing antibiotics and monitoring.  Given the patient's history of HIV and noncompliance - CD4 30 this admit - the patient will need to start on opportunistic infection prophylaxis.   Plan: 1. Adjust Vancomycin to 750 mg IV every 8 hours 2. Continue Azactam 1g IV every 8 hours 3. Continue Levaquin 750 mg IV every 24 hours 4. Will follow ID consult and plans for antibiotics (consider d/c?) and HIV managment 5. Will continue to follow renal function, culture results, LOT, and antibiotic de-escalation plans   Height:  (167.6 cm) Weight: 120 lb 9.5 oz (54.7 kg) IBW/kg (Calculated) : 63.8  Temp (24hrs), Avg:98.2 F (36.8 C), Min:97.7 F (36.5 C), Max:98.9 F (37.2 C)   Recent Labs Lab 09/24/17 2227 09/24/17 2229 09/25/17 0235 09/25/17 1101  WBC 6.6  --   --  4.2  CREATININE 0.90  --  0.77  --   LATICACIDVEN  --  1.12  --   --     Estimated Creatinine Clearance: 103.5 mL/min (by C-G formula based on SCr of 0.77 mg/dL).    Allergies  Allergen Reactions  . Penicillins Hives   Aztreonam 9/27>> Levofloxacin 9/27>> Vancomycin 9/27>>  9/26 BCx >> 9/27 MRSA PCR >> negative 9/27 PCP >> 9/27 RCx >>  Thank you for allowing pharmacy to be a part of this patient's care.  Georgina Pillion, PharmD, BCPS Clinical Pharmacist Pager: [phone] Clinical phone for 09/26/2017 from 7a-3:30p: [phone] If after 3:30p, please call main pharmacy at: [phone] 09/26/2017 11:38 AM

## 2017-09-26 NOTE — Progress Notes (Signed)
Patient transferred to 4V40 .Alert and oriented x4. VS stable, denies c/o pain. Oriented to room and call bell.

## 2017-09-26 NOTE — Progress Notes (Signed)
Patient finished dinner tray. Will transfer patient to 6N

## 2017-09-26 NOTE — Progress Notes (Addendum)
[place] TEAM 1 - Stepdown/ICU TEAM  Anthony Yang  ZOX:[id number] DOB: [birth year] DOA: 09/24/2017 PCP: Patient, No Pcp Per    Brief Narrative:  31yo M w/ a Hx of HIV/AIDS who was lost to follow-up ~2 years ago who presented w/ 2 months of fatigue, generalized weakness, loss of appetite, and malaise w/ weight loss.  He reported that he is in the process of transferring his HIV care to [facility], but acknowledged that he has not taken any medications for this since early 2016.  In the ED the patient was found to be febrile to 39.5 C and tachycardic in the 130s.  Chest x-ray was negative for acute cardiopulmonary disease. Chemistry panel revealed a sodium of 121 and albumin of 2.2. CBC was notable for hemoglobin of 5.4.   Subjective: Pt is resting comfortably.  He denies cp, sob, n/v, or abdom pain. He tells me he feels much better s/p transfusion.  He is very hungry and wishes to eat.    Assessment & Plan:  Severe symptomatic anemia of unclear etiology Stool is guiac + - has been transfused 2U PRBC thus far - Fe studies most c/w poor nutritional status - will need GI eval when clinically stable - Hgb was 14.8 in 2016 - perhaps this is directly related to HIV itself, and guiaic status could be due to a simple explanation such as hemorrhoids   Recent Labs Lab 09/24/17 2227 09/25/17 1101  HGB 5.4* 7.6*    Sepsis versus SIRS - FUO Unclear that an acute infection is present, but isolated elevated temp is concerning - sx could be simply SIRS due to severe anemia - UA unrevealing - blood cx no growth thus far - no focal infiltrate on CXR - cont empiric broad coverage for now, but suspect this can soon be safely stopped   AIDS Will need ID consult during this hospital stay - has been off tx since early 2016 - CD4 is 12 - HIV viral load 600K  Hyponatremia Likely simply due to hypovolemia - low urine Na c/w this   Severe malnutrition in context of chronic illness  DVT prophylaxis: SCDs Code  Status: FULL CODE Family Communication: mutiple family members present at time of exam  Disposition Plan: greatly stabilized - begin diet as emergent GI eval not required - transfer to med bed   Consultants:  none  Procedures: none  Antimicrobials:  Levaquin 9/26 > Aztreonam 9/26 > Vancomycin 9/26 >  Objective: Blood pressure (!) 100/55, pulse 96, temperature 97.8 F (36.6 C), temperature source Oral, resp. rate 16, height  (1.676 m), weight 54.7 kg (120 lb 9.5 oz), SpO2 100 %.  Intake/Output Summary (Last 24 hours) at 09/26/17 1729 Last data filed at 09/26/17 1221  Gross per 24 hour  Intake          4164.75 ml  Output             1900 ml  Net          2264.75 ml   Filed Weights   09/24/17 2213 09/25/17 0643 09/26/17 0427  Weight: 43.1 kg (95 lb) 53.8 kg (118 lb 9.7 oz) 54.7 kg (120 lb 9.5 oz)    Examination: General: No acute respiratory distress Lungs: Clear to auscultation bilaterally without wheezes or crackles Cardiovascular: Regular rate and rhythm without murmur gallop or rub normal S1 and S2 Abdomen: Nontender, nondistended, soft, bowel sounds positive, no rebound, no ascites, no appreciable mass Extremities: No significant cyanosis, clubbing, or  edema bilateral lower extremities  CBC:  Recent Labs Lab 09/24/17 2227 09/25/17 1101  WBC 6.6 4.2  NEUTROABS 4.3 3.1  HGB 5.4* 7.6*  HCT 17.1* 23.5*  MCV 79.2 78.6  PLT 180 152   Basic Metabolic Panel:  Recent Labs Lab 09/24/17 2227 09/25/17 0235  NA 121* 125*  K 4.7 4.0  CL 89* 97*  CO2 22 19*  GLUCOSE 127* 134*  BUN 6 <5*  CREATININE 0.90 0.77  CALCIUM 7.5* 6.9*   GFR: Estimated Creatinine Clearance: 103.5 mL/min (by C-G formula based on SCr of 0.77 mg/dL).  Liver Function Tests:  Recent Labs Lab 09/24/17 2227  AST 40  ALT 12*  ALKPHOS 66  BILITOT 1.3*  PROT 7.2  ALBUMIN 2.2*    Coagulation Profile:  Recent Labs Lab 09/25/17 0006  INR 1.28    CBG:  Recent Labs Lab  09/25/17 0823 09/25/17 1215 09/25/17 1940 09/25/17 2321 09/26/17 0353  GLUCAP 100* 87 90 110* 103*    Recent Results (from the past 240 hour(s))  Blood Culture (routine x 2)     Status: None (Preliminary result)   Collection Time: 09/25/17 12:06 AM  Result Value Ref Range Status   Specimen Description BLOOD BLOOD RIGHT FOREARM  Final   Special Requests   Final    BOTTLES DRAWN AEROBIC AND ANAEROBIC Blood Culture adequate volume   Culture NO GROWTH 1 DAY  Final   Report Status PENDING  Incomplete  Blood Culture (routine x 2)     Status: None (Preliminary result)   Collection Time: 09/25/17 12:36 AM  Result Value Ref Range Status   Specimen Description BLOOD LEFT WRIST  Final   Special Requests   Final    BOTTLES DRAWN AEROBIC AND ANAEROBIC Blood Culture adequate volume   Culture NO GROWTH 1 DAY  Final   Report Status PENDING  Incomplete  MRSA PCR Screening     Status: None   Collection Time: 09/25/17  6:50 AM  Result Value Ref Range Status   MRSA by PCR NEGATIVE NEGATIVE Final    Comment:        The GeneXpert MRSA Assay (FDA approved for NASAL specimens only), is one component of a comprehensive MRSA colonization surveillance program. It is not intended to diagnose MRSA infection nor to guide or monitor treatment for MRSA infections.      Scheduled Meds: . feeding supplement  1 Container Oral TID BM  . heparin subcutaneous  5,000 Units Subcutaneous Q8H  . pantoprazole (PROTONIX) IV  40 mg Intravenous Q12H  . sodium chloride flush  3 mL Intravenous Q12H     LOS: 1 day   Lonia Blood, MD [facility] Office  [phone] Pager - Text Page per Loretha Stapler as per below:  On-Call/Text Page:      [email or web address]      password [email or web address]  If 7PM-7AM, please contact night-coverage [email or web address] Password [email or web address] 09/26/2017, 5:29 PM

## 2017-09-26 NOTE — Progress Notes (Signed)
Report called for patient transfer to 6N, patient requested to be able to eat his dinner tray prior to transfer

## 2017-09-26 NOTE — Progress Notes (Signed)
Initial Nutrition Assessment  DOCUMENTATION CODES:   Severe malnutrition in context of chronic illness  INTERVENTION:    Continue Boost Breeze po TID, each supplement provides 250 kcal and 9 grams of protein  NUTRITION DIAGNOSIS:   Malnutrition (severe) related to catabolic illness (HIV with medication noncompliance) as evidenced by severe depletion of body fat, severe depletion of muscle mass  GOAL:   Patient will meet greater than or equal to 90% of their needs  MONITOR:   PO intake, Supplement acceptance, Labs, Weight trends, Skin, I & O's  REASON FOR ASSESSMENT:   Malnutrition Screening Tool  ASSESSMENT:   31 y.o. Male with medical history significant for HIV/AIDS, lost to follow-up within 2 years ago, now presenting to the emergency department with 2 months of fatigue, generalized weakness, loss of appetite, and malaise.   Pt reports his appetite "comes and goes". He has been drinking Boost supplements at home recently. Endorses a 65 lb weight loss over the last 6-8 weeks. Severe for time frame.  On Clear Liquids. Likes his Boost Breeze supplements ok. Labs and medications reviewed. CBG's 90-110-103.  Nutrition-Focused physical exam completed. Findings are severe fat depletion, severe muscle depletion, and no edema.   Diet Order:  Diet clear liquid Room service appropriate? Yes; Fluid consistency: Thin  Skin:  Wound (see comment) (small 1 mm open area to sacrum)  Last BM:  9/27  Height:   Ht Readings from Last 1 Encounters:  09/25/17  (1.676 m)   Weight:   Wt Readings from Last 1 Encounters:  09/26/17 120 lb 9.5 oz (54.7 kg)   Ideal Body Weight:  64.5 kg  BMI:  Body mass index is 19.46 kg/m.  Estimated Nutritional Needs:   Kcal:  1900-2100  Protein:  95-110 gm  Fluid:  1.9-2.1 L  EDUCATION NEEDS:   No education needs identified at this time  Maureen Chatters, RD, LDN Pager #: [phone] After-Hours Pager #: [phone]

## 2017-09-27 ENCOUNTER — Inpatient Hospital Stay (HOSPITAL_COMMUNITY): Payer: Medicaid Other

## 2017-09-27 DIAGNOSIS — Z880 Allergy status to penicillin: Secondary | ICD-10-CM

## 2017-09-27 DIAGNOSIS — Z8619 Personal history of other infectious and parasitic diseases: Secondary | ICD-10-CM

## 2017-09-27 DIAGNOSIS — E43 Unspecified severe protein-calorie malnutrition: Secondary | ICD-10-CM | POA: Insufficient documentation

## 2017-09-27 DIAGNOSIS — E46 Unspecified protein-calorie malnutrition: Secondary | ICD-10-CM

## 2017-09-27 LAB — CBC
HCT: 21.4 % — ABNORMAL LOW (ref 39.0–52.0)
HCT: 25.2 % — ABNORMAL LOW (ref 39.0–52.0)
HCT: 24.6 % — ABNORMAL LOW (ref 39.0–52.0)
Hemoglobin: 6.8 g/dL — CL (ref 13.0–17.0)
Hemoglobin: 8.5 g/dL — ABNORMAL LOW (ref 13.0–17.0)
Hemoglobin: 7.9 g/dL — ABNORMAL LOW (ref 13.0–17.0)
MCH: 25.1 pg — AB (ref 26.0–34.0)
MCH: 26.9 pg (ref 26.0–34.0)
MCH: 25.6 pg — ABNORMAL LOW (ref 26.0–34.0)
MCHC: 31.8 g/dL (ref 30.0–36.0)
MCHC: 33.7 g/dL (ref 30.0–36.0)
MCHC: 32.1 g/dL (ref 30.0–36.0)
MCV: 79 fL (ref 78.0–100.0)
MCV: 79.7 fL (ref 78.0–100.0)
MCV: 79.6 fL (ref 78.0–100.0)
PLATELETS: 127 10*3/uL — AB (ref 150–400)
PLATELETS: 126 10*3/uL — AB (ref 150–400)
PLATELETS: 126 10*3/uL — AB (ref 150–400)
RBC: 2.71 MIL/uL — ABNORMAL LOW (ref 4.22–5.81)
RBC: 3.16 MIL/uL — ABNORMAL LOW (ref 4.22–5.81)
RBC: 3.09 MIL/uL — ABNORMAL LOW (ref 4.22–5.81)
RDW: 19.8 % — AB (ref 11.5–15.5)
RDW: 19.2 % — AB (ref 11.5–15.5)
RDW: 18.8 % — AB (ref 11.5–15.5)
WBC: 3 10*3/uL — ABNORMAL LOW (ref 4.0–10.5)
WBC: 3.1 10*3/uL — ABNORMAL LOW (ref 4.0–10.5)
WBC: 3.4 10*3/uL — AB (ref 4.0–10.5)

## 2017-09-27 LAB — PREPARE RBC (CROSSMATCH)

## 2017-09-27 LAB — COMPREHENSIVE METABOLIC PANEL
ALBUMIN: 1.5 g/dL — AB (ref 3.5–5.0)
ALK PHOS: 50 U/L (ref 38–126)
ALT: 9 U/L — AB (ref 17–63)
AST: 19 U/L (ref 15–41)
Anion gap: 5 (ref 5–15)
CALCIUM: 6.9 mg/dL — AB (ref 8.9–10.3)
CHLORIDE: 105 mmol/L (ref 101–111)
CO2: 22 mmol/L (ref 22–32)
CREATININE: 0.71 mg/dL (ref 0.61–1.24)
GFR calc Af Amer: >60 mL/min (ref >60)
GFR calc non Af Amer: >60 mL/min (ref >60)
Glucose, Bld: 111 mg/dL — ABNORMAL HIGH (ref 65–99)
Potassium: 3.5 mmol/L (ref 3.5–5.1)
SODIUM: 132 mmol/L — AB (ref 135–145)
Total Bilirubin: 0.8 mg/dL (ref 0.3–1.2)
Total Protein: 5.2 g/dL — ABNORMAL LOW (ref 6.5–8.1)

## 2017-09-27 LAB — OCCULT BLOOD X 1 CARD TO LAB, STOOL: Fecal Occult Bld: NEGATIVE

## 2017-09-27 MED ORDER — SULFAMETHOXAZOLE-TRIMETHOPRIM 400-80 MG PO TABS
1.0000 | ORAL_TABLET | Freq: Every day | ORAL | Status: DC
  Administered 2017-09-27 – 2017-10-02 (×5): 1 via ORAL
  Filled 2017-09-27 (×7): qty 1

## 2017-09-27 MED ORDER — FLUCONAZOLE 100 MG PO TABS
100.0000 mg | ORAL_TABLET | Freq: Every day | ORAL | Status: DC
  Administered 2017-09-27 – 2017-10-02 (×6): 100 mg via ORAL
  Filled 2017-09-27 (×6): qty 1

## 2017-09-27 MED ORDER — TRAMADOL HCL 50 MG PO TABS: 50.0000 mg | ORAL_TABLET | Freq: Four times a day (QID) | ORAL | Status: DC | PRN

## 2017-09-27 MED ORDER — ETHAMBUTOL HCL 100 MG PO TABS
15.0000 mg/kg | ORAL_TABLET | Freq: Every day | ORAL | Status: DC
  Administered 2017-09-27 – 2017-09-29 (×2): 950 mg via ORAL
  Filled 2017-09-27 (×4): qty 1.5

## 2017-09-27 MED ORDER — ELVITEG-COBIC-EMTRICIT-TENOFAF 150-150-200-10 MG PO TABS
1.0000 | ORAL_TABLET | Freq: Every day | ORAL | Status: DC
  Filled 2017-09-27: qty 1

## 2017-09-27 MED ORDER — IOPAMIDOL (ISOVUE-300) INJECTION 61%
INTRAVENOUS | Status: AC
  Filled 2017-09-27: qty 30

## 2017-09-27 MED ORDER — SODIUM CHLORIDE 0.9 % IV SOLN: Freq: Once | INTRAVENOUS | Status: DC

## 2017-09-27 MED ORDER — SODIUM CHLORIDE 0.9 % IV SOLN: INTRAVENOUS | Status: AC

## 2017-09-27 MED ORDER — IOPAMIDOL (ISOVUE-300) INJECTION 61%
INTRAVENOUS | Status: AC
  Administered 2017-09-27: 100 mL
  Filled 2017-09-27: qty 100

## 2017-09-27 MED ORDER — AZITHROMYCIN 500 MG PO TABS
500.0000 mg | ORAL_TABLET | Freq: Every day | ORAL | Status: DC
  Administered 2017-09-27 – 2017-09-30 (×4): 500 mg via ORAL
  Filled 2017-09-27 (×3): qty 1
  Filled 2017-09-27: qty 2

## 2017-09-27 NOTE — Progress Notes (Signed)
Pt sat up in chair and ambulated to bathroom with assistance. No complaints of pain.

## 2017-09-27 NOTE — Consult Note (Signed)
[name] for Infectious Disease    Date of Admission:  09/24/2017   Total days of antibiotics: 3 vanco/aztreonam/levaquin               Reason for Consult: AIDS    Referring Provider: Thereasa Solo   Assessment: AIDS Non-compliance/Lost to f/u Anemia Fever Hyponatremia Protein Calorie malnutrition  Plan: 1. Na improved with hydration 2. Send BCx for AFB 3. Start therapy for MAI 4. Bactrim for pcp prophylaxis 5. Stop vanco and aztreonam today 6. Will have adherence nurse see him at d/c- we would be glad to see him in clinic 7. Check HIV genotype, [id number] 8. Start bactrim, qday 9. Std screening 10. Start genvoya 11. Diflucan for thrush 12. His PEN allergy is rash, I would re-challenge him as needed.   Comment The pt and I discussed his care in front of his father with his permission.  He simply appears to have been too busy to follow up and get his medicine.  He appears to be lacking insight into his disease. We discussed his CD4 and what a viral load is.  I stressed to him multiple times that he must start his medicine and stay on his medicine Would only give him 1 month of meds at d/c with no refills til he f/u He wants to f/u at [facility] and believes he is in process to be seen by them.    Thank you so much for this interesting consult,  Principal Problem:   Symptomatic anemia Active Problems:   Sepsis, unspecified organism ([place])   Hyponatremia   GI bleeding   AIDS (HCC)   Non-compliance   . iopamidol      . feeding supplement  1 Container Oral TID BM  . pantoprazole  40 mg Oral BID  . sodium chloride flush  3 mL Intravenous Q12H    HPI: Anthony Yang is a 31 y.o. 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 He has been prev prescribed atripla --> genvoya. Previous CD4 was 25 (01-2015).  He has been off meds and out of care for 2 years.  He comes to [facility] on 9-27 with 2 months of fatigue, weakness,  anorexia. He was found to have temp 103.1 and tachycardia (142). He had Na 121, alb 2.2 and was anemic (hgb 5.4). He was started on vancomycin, azactam and levaquin. His CXR was clear. He was guaic positive.   HIV 1 RNA Quant (copies/mL)  Date Value  09/25/2017 604,000   CD4 T Cell Abs (/uL)  Date Value  09/25/2017 30 (L)     Review of Systems: Review of Systems  Constitutional: Positive for fever and weight loss. Negative for chills.  Respiratory: Negative for cough and sputum production.   Gastrointestinal: Positive for nausea and vomiting. Negative for blood in stool and melena.  no dysphagia He denies genital or anal sores.  Please see HPI. 12 point ROS o/w (-)    Past Medical History:  Diagnosis Date  . AIDS (acquired immune deficiency syndrome) ([place])    hx/notes 09/25/2017  . Anemia   . History of blood transfusion 09/25/2017   "anemic"  . HIV (human immunodeficiency virus infection) ([place])    hx/notes 09/24/2017    Social History  Substance Use Topics  . Smoking status: Never Smoker  . Smokeless tobacco: Never Used  . Alcohol use 1.8 oz/week    3 Shots of  liquor per week    History reviewed. No pertinent family history.   Medications:  Scheduled: . feeding supplement  1 Container Oral TID BM  . iopamidol      . pantoprazole  40 mg Oral BID  . sodium chloride flush  3 mL Intravenous Q12H    Abtx:  Anti-infectives    Start     Dose/Rate Route Frequency Ordered Stop   09/26/17 1700  vancomycin (VANCOCIN) IVPB 750 mg/150 ml premix  Status:  Discontinued     750 mg 150 mL/hr over 60 Minutes Intravenous Every 8 hours 09/26/17 1137 09/26/17 1824   09/25/17 2200  levofloxacin (LEVAQUIN) IVPB 750 mg     750 mg 100 mL/hr over 90 Minutes Intravenous Every 24 hours 09/25/17 0136     09/25/17 0830  vancomycin (VANCOCIN) 500 mg in sodium chloride 0.9 % 100 mL IVPB  Status:  Discontinued     500 mg 100 mL/hr over 60 Minutes Intravenous Every 8 hours 09/25/17 0800  09/26/17 1137   09/25/17 0600  vancomycin (VANCOCIN) 500 mg in sodium chloride 0.9 % 100 mL IVPB  Status:  Discontinued     500 mg 100 mL/hr over 60 Minutes Intravenous Every 8 hours 09/25/17 0136 09/25/17 0800   09/25/17 0600  aztreonam (AZACTAM) 1 g in dextrose 5 % 50 mL IVPB     1 g 100 mL/hr over 30 Minutes Intravenous Every 8 hours 09/25/17 0136     09/24/17 2345  levofloxacin (LEVAQUIN) IVPB 750 mg     750 mg 100 mL/hr over 90 Minutes Intravenous  Once 09/24/17 2335 09/25/17 0219   09/24/17 2345  aztreonam (AZACTAM) 2 g in dextrose 5 % 50 mL IVPB     2 g 100 mL/hr over 30 Minutes Intravenous  Once 09/24/17 2335 09/25/17 0131   09/24/17 2345  vancomycin (VANCOCIN) IVPB 1000 mg/200 mL premix     1,000 mg 200 mL/hr over 60 Minutes Intravenous  Once 09/24/17 2335 09/25/17 0219        OBJECTIVE: Blood pressure 108/64, pulse 91, temperature 99 F (37.2 C), temperature source Oral, resp. rate 18, height 5' 6"  (1.676 m), weight 61.9 kg (136 lb 6.4 oz), SpO2 100 %.  Physical Exam  Constitutional: He appears cachectic. No distress.  HENT:  Mouth/Throat: Oropharyngeal exudate present.  Eyes: Pupils are equal, round, and reactive to light. EOM are normal.  Neck: Neck supple.  Cardiovascular: Regular rhythm and normal heart sounds.   Pulmonary/Chest: Effort normal and breath sounds normal.  Abdominal: Soft. Bowel sounds are normal. There is no tenderness. There is no rebound.  Musculoskeletal: He exhibits no edema.  Lymphadenopathy:    He has no cervical adenopathy.  Skin: He is not diaphoretic.    Lab Results Results for orders placed or performed during the hospital encounter of 09/24/17 (from the past 48 hour(s))  Glucose, capillary     Status: None   Collection Time: 09/25/17  7:40 PM  Result Value Ref Range   Glucose-Capillary 90 65 - 99 mg/dL  Glucose, capillary     Status: Abnormal   Collection Time: 09/25/17 11:21 PM  Result Value Ref Range   Glucose-Capillary 110  (H) 65 - 99 mg/dL  Glucose, capillary     Status: Abnormal   Collection Time: 09/26/17  3:53 AM  Result Value Ref Range   Glucose-Capillary 103 (H) 65 - 99 mg/dL  Comprehensive metabolic panel     Status: Abnormal   Collection Time: 09/27/17  4:18  AM  Result Value Ref Range   Sodium 132 (L) 135 - 145 mmol/L   Potassium 3.5 3.5 - 5.1 mmol/L   Chloride 105 101 - 111 mmol/L   CO2 22 22 - 32 mmol/L   Glucose, Bld 111 (H) 65 - 99 mg/dL   BUN <5 (L) 6 - 20 mg/dL   Creatinine, Ser 0.71 0.61 - 1.24 mg/dL   Calcium 6.9 (L) 8.9 - 10.3 mg/dL   Total Protein 5.2 (L) 6.5 - 8.1 g/dL   Albumin 1.5 (L) 3.5 - 5.0 g/dL   AST 19 15 - 41 U/L   ALT 9 (L) 17 - 63 U/L   Alkaline Phosphatase 50 38 - 126 U/L   Total Bilirubin 0.8 0.3 - 1.2 mg/dL   GFR calc non Af Amer >60 >60 mL/min   GFR calc Af Amer >60 >60 mL/min    Comment: (NOTE) The eGFR has been calculated using the CKD EPI equation. This calculation has not been validated in all clinical situations. eGFR's persistently <60 mL/min signify possible Chronic Kidney Disease.    Anion gap 5 5 - 15  CBC     Status: Abnormal   Collection Time: 09/27/17  4:18 AM  Result Value Ref Range   WBC 3.0 (L) 4.0 - 10.5 K/uL   RBC 2.71 (L) 4.22 - 5.81 MIL/uL   Hemoglobin 6.8 (LL) 13.0 - 17.0 g/dL    Comment: REPEATED TO VERIFY CRITICAL RESULT CALLED TO, READ BACK BY AND VERIFIED WITH: N.CAGUIOA,RN 0517 09/27/17 M.CAMPBELL    HCT 21.4 (L) 39.0 - 52.0 %   MCV 79.0 78.0 - 100.0 fL   MCH 25.1 (L) 26.0 - 34.0 pg   MCHC 31.8 30.0 - 36.0 g/dL   RDW 19.8 (H) 11.5 - 15.5 %   Platelets 127 (L) 150 - 400 K/uL  Prepare RBC     Status: None   Collection Time: 09/27/17  5:35 AM  Result Value Ref Range   Order Confirmation ORDER PROCESSED BY BLOOD BANK   CBC     Status: Abnormal   Collection Time: 09/27/17 10:24 AM  Result Value Ref Range   WBC 3.1 (L) 4.0 - 10.5 K/uL   RBC 3.16 (L) 4.22 - 5.81 MIL/uL   Hemoglobin 8.5 (L) 13.0 - 17.0 g/dL   HCT 25.2 (L) 39.0 -  52.0 %   MCV 79.7 78.0 - 100.0 fL   MCH 26.9 26.0 - 34.0 pg   MCHC 33.7 30.0 - 36.0 g/dL   RDW 19.2 (H) 11.5 - 15.5 %   Platelets 126 (L) 150 - 400 K/uL      Component Value Date/Time   SDES BLOOD LEFT WRIST 09/25/2017 0036   SPECREQUEST  09/25/2017 0036    BOTTLES DRAWN AEROBIC AND ANAEROBIC Blood Culture adequate volume   CULT NO GROWTH 2 DAYS 09/25/2017 0036   REPTSTATUS PENDING 09/25/2017 0036   No results found. Recent Results (from the past 240 hour(s))  Blood Culture (routine x 2)     Status: None (Preliminary result)   Collection Time: 09/25/17 12:06 AM  Result Value Ref Range Status   Specimen Description BLOOD BLOOD RIGHT FOREARM  Final   Special Requests   Final    BOTTLES DRAWN AEROBIC AND ANAEROBIC Blood Culture adequate volume   Culture NO GROWTH 2 DAYS  Final   Report Status PENDING  Incomplete  Blood Culture (routine x 2)     Status: None (Preliminary result)   Collection Time: 09/25/17 12:36 AM  Result Value Ref Range Status   Specimen Description BLOOD LEFT WRIST  Final   Special Requests   Final    BOTTLES DRAWN AEROBIC AND ANAEROBIC Blood Culture adequate volume   Culture NO GROWTH 2 DAYS  Final   Report Status PENDING  Incomplete  MRSA PCR Screening     Status: None   Collection Time: 09/25/17  6:50 AM  Result Value Ref Range Status   MRSA by PCR NEGATIVE NEGATIVE Final    Comment:        The GeneXpert MRSA Assay (FDA approved for NASAL specimens only), is one component of a comprehensive MRSA colonization surveillance program. It is not intended to diagnose MRSA infection nor to guide or monitor treatment for MRSA infections.     Microbiology: Recent Results (from the past 240 hour(s))  Blood Culture (routine x 2)     Status: None (Preliminary result)   Collection Time: 09/25/17 12:06 AM  Result Value Ref Range Status   Specimen Description BLOOD BLOOD RIGHT FOREARM  Final   Special Requests   Final    BOTTLES DRAWN AEROBIC AND ANAEROBIC  Blood Culture adequate volume   Culture NO GROWTH 2 DAYS  Final   Report Status PENDING  Incomplete  Blood Culture (routine x 2)     Status: None (Preliminary result)   Collection Time: 09/25/17 12:36 AM  Result Value Ref Range Status   Specimen Description BLOOD LEFT WRIST  Final   Special Requests   Final    BOTTLES DRAWN AEROBIC AND ANAEROBIC Blood Culture adequate volume   Culture NO GROWTH 2 DAYS  Final   Report Status PENDING  Incomplete  MRSA PCR Screening     Status: None   Collection Time: 09/25/17  6:50 AM  Result Value Ref Range Status   MRSA by PCR NEGATIVE NEGATIVE Final    Comment:        The GeneXpert MRSA Assay (FDA approved for NASAL specimens only), is one component of a comprehensive MRSA colonization surveillance program. It is not intended to diagnose MRSA infection nor to guide or monitor treatment for MRSA infections.     Bobby Rumpf, MD [facility] [phone] 09/27/2017, 3:34 PM

## 2017-09-27 NOTE — Progress Notes (Signed)
[place] TEAM 1 - Stepdown/ICU TEAM  Anthony Yang  EAV:[id number] DOB: July 16, 1986 DOA: 09/24/2017 PCP: Patient, No Pcp Per    Brief Narrative:  31yo M w/ a Hx of HIV/AIDS who was lost to follow-up ~2 years ago who presented w/ 2 months of fatigue, generalized weakness, loss of appetite, and malaise w/ weight loss.  He reported that he is in the process of transferring his HIV care to [facility], but acknowledged that he has not taken any medications for this since early 2016.  In the ED the patient was found to be febrile to 39.5 C and tachycardic in the 130s.  Chest x-ray was negative for acute cardiopulmonary disease. Chemistry panel revealed a sodium of 121 and albumin of 2.2. CBC was notable for hemoglobin of 5.4.   Subjective: The patient is resting comfortably in bed.  He states he feels much better overall.  He is very hungry and ate every single thing on his tray last evening.  He denies shortness of breath dizziness lightheadedness.  He vehemently denies melena.  He tells me he does have "irritable bowel" which he describes as unexpected intermittent episodes of explosive diarrhea.  He reports however that this has been well controlled as of late.  He denies dyspepsia.  No nausea or vomiting of any kind.  Assessment & Plan:  Severe symptomatic anemia of unclear etiology Stool was guiac + but this was w/ a small amount of red blood at the time of a rectal exam w/ brown stool otherwise - the pt has not experienced any BRBPR otherwise, and denies melena - I do not suspect significant GI blood loss - has been transfused 3U PRBC thus far, w/ further drop in Hgb this AM likely due to dilution - Fe studies most c/w poor nutritional status - Hgb was 14.8 in 2016 - perhaps this is directly related to HIV itself - ID to see to today - will check CT abd/pelvis to r/o obvious mass or diverticular disease - discussed at length w/ pt who agrees and wishes to avoid colonoscopy for now if at all possible -  recheck Hgb around noon and again this evening - stop IVF   Recent Labs Lab 09/24/17 2227 09/25/17 1101 09/27/17 0418  HGB 5.4* 7.6* 6.8*    Sepsis versus SIRS - FUO Unclear that an acute infection is present, but isolated elevated temp is concerning - sx could be simply SIRS due to severe anemia - UA unrevealing - blood cx no growth thus far - no focal infiltrate on CXR - cont empiric broad coverage until ID sees pt later today, at which time I suspect they will be stopped   AIDS I have asked ID to see the pt today - has been off tx since early 2016 - CD4 is 12 - HIV viral load 600K - of note HIS FAMILY IS NOT AWARE OF THIS DIAGNOSIS  Hyponatremia Likely simply due to hypovolemia - low urine Na c/w this - has nearly resolved w/ volume expansion   Severe malnutrition in context of chronic illness Likely AIDS waisting - presently has a great appetite w/ good intake   DVT prophylaxis: SCDs Code Status: FULL CODE Family Communication: spoke w/ mother at bedsdie  Disposition Plan: stable - cont diet - ID to see - follow Hgb - pt strongly desires d/c home early in AM   Consultants:  ID  Procedures: none  Antimicrobials:  Levaquin 9/26 > Aztreonam 9/26 > Vancomycin 9/26 >  Objective:  Blood pressure 98/60, pulse 98, temperature 98.4 F (36.9 C), temperature source Oral, resp. rate 18, height  (1.676 m), weight 61.9 kg (136 lb 6.4 oz), SpO2 100 %.  Intake/Output Summary (Last 24 hours) at 09/27/17 0936 Last data filed at 09/27/17 0650  Gross per 24 hour  Intake             1890 ml  Output              701 ml  Net             1189 ml   Filed Weights   09/25/17 0643 09/26/17 0427 09/26/17 2158  Weight: 53.8 kg (118 lb 9.7 oz) 54.7 kg (120 lb 9.5 oz) 61.9 kg (136 lb 6.4 oz)    Examination: General: No acute respiratory distress Lungs: CTA B w/o wheeze or crackles  Cardiovascular: RRR w/o M/G/R Abdomen: Nontender, nondistended, soft, bowel sounds positive, no  rebound, no ascites, no appreciable mass - very thin  Extremities: no C/C/E B LE   CBC:  Recent Labs Lab 09/24/17 2227 09/25/17 1101 09/27/17 0418  WBC 6.6 4.2 3.0*  NEUTROABS 4.3 3.1  --   HGB 5.4* 7.6* 6.8*  HCT 17.1* 23.5* 21.4*  MCV 79.2 78.6 79.0  PLT 180 152 127*   Basic Metabolic Panel:  Recent Labs Lab 09/24/17 2227 09/25/17 0235 09/27/17 0418  NA 121* 125* 132*  K 4.7 4.0 3.5  CL 89* 97* 105  CO2 22 19* 22  GLUCOSE 127* 134* 111*  BUN 6 <5* <5*  CREATININE 0.90 0.77 0.71  CALCIUM 7.5* 6.9* 6.9*   GFR: Estimated Creatinine Clearance: 117.1 mL/min (by C-G formula based on SCr of 0.71 mg/dL).  Liver Function Tests:  Recent Labs Lab 09/24/17 2227 09/27/17 0418  AST 40 19  ALT 12* 9*  ALKPHOS 66 50  BILITOT 1.3* 0.8  PROT 7.2 5.2*  ALBUMIN 2.2* 1.5*    Coagulation Profile:  Recent Labs Lab 09/25/17 0006  INR 1.28    CBG:  Recent Labs Lab 09/25/17 0823 09/25/17 1215 09/25/17 1940 09/25/17 2321 09/26/17 0353  GLUCAP 100* 87 90 110* 103*    Recent Results (from the past 240 hour(s))  Blood Culture (routine x 2)     Status: None (Preliminary result)   Collection Time: 09/25/17 12:06 AM  Result Value Ref Range Status   Specimen Description BLOOD BLOOD RIGHT FOREARM  Final   Special Requests   Final    BOTTLES DRAWN AEROBIC AND ANAEROBIC Blood Culture adequate volume   Culture NO GROWTH 1 DAY  Final   Report Status PENDING  Incomplete  Blood Culture (routine x 2)     Status: None (Preliminary result)   Collection Time: 09/25/17 12:36 AM  Result Value Ref Range Status   Specimen Description BLOOD LEFT WRIST  Final   Special Requests   Final    BOTTLES DRAWN AEROBIC AND ANAEROBIC Blood Culture adequate volume   Culture NO GROWTH 1 DAY  Final   Report Status PENDING  Incomplete  MRSA PCR Screening     Status: None   Collection Time: 09/25/17  6:50 AM  Result Value Ref Range Status   MRSA by PCR NEGATIVE NEGATIVE Final    Comment:         The GeneXpert MRSA Assay (FDA approved for NASAL specimens only), is one component of a comprehensive MRSA colonization surveillance program. It is not intended to diagnose MRSA infection nor to guide or monitor treatment  for MRSA infections.      Scheduled Meds: . feeding supplement  1 Container Oral TID BM  . pantoprazole  40 mg Oral BID  . sodium chloride flush  3 mL Intravenous Q12H     LOS: 2 days   Lonia Blood, MD [facility] Office  [phone] Pager - Text Page per Loretha Stapler as per below:  On-Call/Text Page:      [email or web address]      password [email or web address]  If 7PM-7AM, please contact night-coverage [email or web address] Password [facility] 09/27/2017, 9:36 AM

## 2017-09-28 ENCOUNTER — Inpatient Hospital Stay (HOSPITAL_COMMUNITY): Payer: Medicaid Other

## 2017-09-28 ENCOUNTER — Encounter (HOSPITAL_COMMUNITY): Payer: Self-pay | Admitting: General Surgery

## 2017-09-28 DIAGNOSIS — B20 Human immunodeficiency virus [HIV] disease: Secondary | ICD-10-CM

## 2017-09-28 DIAGNOSIS — R000 Tachycardia, unspecified: Secondary | ICD-10-CM

## 2017-09-28 DIAGNOSIS — Z9911 Dependence on respirator [ventilator] status: Secondary | ICD-10-CM

## 2017-09-28 DIAGNOSIS — D649 Anemia, unspecified: Secondary | ICD-10-CM

## 2017-09-28 DIAGNOSIS — J9600 Acute respiratory failure, unspecified whether with hypoxia or hypercapnia: Secondary | ICD-10-CM

## 2017-09-28 DIAGNOSIS — A419 Sepsis, unspecified organism: Principal | ICD-10-CM

## 2017-09-28 DIAGNOSIS — R509 Fever, unspecified: Secondary | ICD-10-CM

## 2017-09-28 DIAGNOSIS — I959 Hypotension, unspecified: Secondary | ICD-10-CM

## 2017-09-28 DIAGNOSIS — E43 Unspecified severe protein-calorie malnutrition: Secondary | ICD-10-CM

## 2017-09-28 DIAGNOSIS — D6489 Other specified anemias: Secondary | ICD-10-CM

## 2017-09-28 LAB — BASIC METABOLIC PANEL
Anion gap: 5 (ref 5–15)
CHLORIDE: 109 mmol/L (ref 101–111)
CO2: 19 mmol/L — AB (ref 22–32)
CREATININE: 1 mg/dL (ref 0.61–1.24)
Calcium: 6.9 mg/dL — ABNORMAL LOW (ref 8.9–10.3)
GFR calc Af Amer: >60 mL/min (ref >60)
GFR calc non Af Amer: >60 mL/min (ref >60)
GLUCOSE: 97 mg/dL (ref 65–99)
Potassium: 3.8 mmol/L (ref 3.5–5.1)
Sodium: 133 mmol/L — ABNORMAL LOW (ref 135–145)

## 2017-09-28 LAB — GLUCOSE, CAPILLARY: GLUCOSE-CAPILLARY: 83 mg/dL (ref 65–99)

## 2017-09-28 LAB — CBC
HCT: 23.6 % — ABNORMAL LOW (ref 39.0–52.0)
HCT: 24.4 % — ABNORMAL LOW (ref 39.0–52.0)
HCT: 24 % — ABNORMAL LOW (ref 39.0–52.0)
HEMATOCRIT: 22.8 % — AB (ref 39.0–52.0)
HEMOGLOBIN: 7.9 g/dL — AB (ref 13.0–17.0)
Hemoglobin: 7.5 g/dL — ABNORMAL LOW (ref 13.0–17.0)
Hemoglobin: 7.6 g/dL — ABNORMAL LOW (ref 13.0–17.0)
Hemoglobin: 8 g/dL — ABNORMAL LOW (ref 13.0–17.0)
MCH: 26 pg (ref 26.0–34.0)
MCH: 25.7 pg — AB (ref 26.0–34.0)
MCH: 26.4 pg (ref 26.0–34.0)
MCH: 26.2 pg (ref 26.0–34.0)
MCHC: 32.9 g/dL (ref 30.0–36.0)
MCHC: 32.2 g/dL (ref 30.0–36.0)
MCHC: 32.8 g/dL (ref 30.0–36.0)
MCHC: 32.9 g/dL (ref 30.0–36.0)
MCV: 79.2 fL (ref 78.0–100.0)
MCV: 79.7 fL (ref 78.0–100.0)
MCV: 80.5 fL (ref 78.0–100.0)
MCV: 79.7 fL (ref 78.0–100.0)
PLATELETS: 119 10*3/uL — AB (ref 150–400)
PLATELETS: 109 10*3/uL — AB (ref 150–400)
Platelets: 113 10*3/uL — ABNORMAL LOW (ref 150–400)
Platelets: 98 10*3/uL — ABNORMAL LOW (ref 150–400)
RBC: 2.88 MIL/uL — ABNORMAL LOW (ref 4.22–5.81)
RBC: 2.96 MIL/uL — ABNORMAL LOW (ref 4.22–5.81)
RBC: 3.03 MIL/uL — AB (ref 4.22–5.81)
RBC: 3.01 MIL/uL — ABNORMAL LOW (ref 4.22–5.81)
RDW: 19 % — ABNORMAL HIGH (ref 11.5–15.5)
RDW: 19.2 % — AB (ref 11.5–15.5)
RDW: 19.7 % — AB (ref 11.5–15.5)
RDW: 19.8 % — ABNORMAL HIGH (ref 11.5–15.5)
WBC: 5.1 10*3/uL (ref 4.0–10.5)
WBC: 7.6 10*3/uL (ref 4.0–10.5)
WBC: 7.3 10*3/uL (ref 4.0–10.5)
WBC: 5.3 10*3/uL (ref 4.0–10.5)

## 2017-09-28 LAB — LACTIC ACID, PLASMA: LACTIC ACID, VENOUS: 1.2 mmol/L (ref 0.5–1.9)

## 2017-09-28 LAB — MRSA PCR SCREENING: MRSA by PCR: NEGATIVE

## 2017-09-28 LAB — PREPARE RBC (CROSSMATCH)

## 2017-09-28 LAB — CORTISOL: CORTISOL PLASMA: 25.4 ug/dL

## 2017-09-28 LAB — RPR: RPR: NONREACTIVE

## 2017-09-28 LAB — PROCALCITONIN: PROCALCITONIN: 3.47 ng/mL

## 2017-09-28 LAB — OCCULT BLOOD X 1 CARD TO LAB, STOOL: Fecal Occult Bld: NEGATIVE

## 2017-09-28 MED ORDER — SODIUM CHLORIDE 0.9 % IV SOLN: Freq: Once | INTRAVENOUS | Status: DC

## 2017-09-28 MED ORDER — SODIUM CHLORIDE 0.9 % IV BOLUS (SEPSIS)
1000.0000 mL | Freq: Once | INTRAVENOUS | Status: AC
  Administered 2017-09-28: 1000 mL via INTRAVENOUS

## 2017-09-28 MED ORDER — WHITE PETROLATUM GEL
Status: AC
  Administered 2017-09-28
  Filled 2017-09-28: qty 1

## 2017-09-28 MED ORDER — ACETAMINOPHEN 500 MG PO TABS
500.0000 mg | ORAL_TABLET | Freq: Once | ORAL | Status: AC
  Administered 2017-09-28: 500 mg via ORAL
  Filled 2017-09-28: qty 1

## 2017-09-28 MED ORDER — SODIUM CHLORIDE 0.9 % IV BOLUS (SEPSIS): 500.0000 mL | Freq: Once | INTRAVENOUS | Status: DC

## 2017-09-28 MED ORDER — SODIUM CHLORIDE 0.9 % IV SOLN
INTRAVENOUS | Status: DC
  Administered 2017-09-28 – 2017-10-02 (×8): via INTRAVENOUS

## 2017-09-28 MED ORDER — VANCOMYCIN HCL IN DEXTROSE 750-5 MG/150ML-% IV SOLN
750.0000 mg | Freq: Three times a day (TID) | INTRAVENOUS | Status: DC
  Administered 2017-09-28 – 2017-09-29 (×3): 750 mg via INTRAVENOUS
  Filled 2017-09-28 (×5): qty 150

## 2017-09-28 MED ORDER — SODIUM CHLORIDE 0.9 % IV SOLN
0.0000 ug/min | INTRAVENOUS | Status: DC
  Administered 2017-09-28: 25 ug/min via INTRAVENOUS
  Filled 2017-09-28 (×3): qty 1

## 2017-09-28 NOTE — Progress Notes (Signed)
INFECTIOUS DISEASE PROGRESS NOTE  Anthony Yang is a 31 y.o. male with  Principal Problem:   Symptomatic anemia Active Problems:   Sepsis, unspecified organism (HCC)   Hyponatremia   GI bleeding   AIDS (HCC)   Non-compliance   Protein-calorie malnutrition, severe  Subjective: Continued fever, tachycardia C/o chills, on cooling blanket. On neo, IVF bolus.   Abtx:  Anti-infectives    Start     Dose/Rate Route Frequency Ordered Stop   09/28/17 0800  elvitegravir-cobicistat-emtricitabine-tenofovir (GENVOYA) 150-150-200-10 MG tablet 1 tablet  Status:  Discontinued     1 tablet Oral Daily with breakfast 09/27/17 1615 09/28/17 1035   09/27/17 1630  fluconazole (DIFLUCAN) tablet 100 mg     100 mg Oral Daily 09/27/17 1615 10/04/17 0959   09/27/17 1630  sulfamethoxazole-trimethoprim (BACTRIM,SEPTRA) 400-80 MG per tablet 1 tablet     1 tablet Oral Daily 09/27/17 1615     09/27/17 1630  ethambutol (MYAMBUTOL) tablet 950 mg     15 mg/kg  61.9 kg Oral Daily 09/27/17 1615     09/27/17 1630  azithromycin (ZITHROMAX) tablet 500 mg     500 mg Oral Daily 09/27/17 1615     09/26/17 1700  vancomycin (VANCOCIN) IVPB 750 mg/150 ml premix  Status:  Discontinued     750 mg 150 mL/hr over 60 Minutes Intravenous Every 8 hours 09/26/17 1137 09/26/17 1824   09/25/17 2200  levofloxacin (LEVAQUIN) IVPB 750 mg     750 mg 100 mL/hr over 90 Minutes Intravenous Every 24 hours 09/25/17 0136     09/25/17 0830  vancomycin (VANCOCIN) 500 mg in sodium chloride 0.9 % 100 mL IVPB  Status:  Discontinued     500 mg 100 mL/hr over 60 Minutes Intravenous Every 8 hours 09/25/17 0800 09/26/17 1137   09/25/17 0600  vancomycin (VANCOCIN) 500 mg in sodium chloride 0.9 % 100 mL IVPB  Status:  Discontinued     500 mg 100 mL/hr over 60 Minutes Intravenous Every 8 hours 09/25/17 0136 09/25/17 0800   09/25/17 0600  aztreonam (AZACTAM) 1 g in dextrose 5 % 50 mL IVPB  Status:  Discontinued     1 g 100 mL/hr over 30  Minutes Intravenous Every 8 hours 09/25/17 0136 09/27/17 1615   09/24/17 2345  levofloxacin (LEVAQUIN) IVPB 750 mg     750 mg 100 mL/hr over 90 Minutes Intravenous  Once 09/24/17 2335 09/25/17 0219   09/24/17 2345  aztreonam (AZACTAM) 2 g in dextrose 5 % 50 mL IVPB     2 g 100 mL/hr over 30 Minutes Intravenous  Once 09/24/17 2335 09/25/17 0131   09/24/17 2345  vancomycin (VANCOCIN) IVPB 1000 mg/200 mL premix     1,000 mg 200 mL/hr over 60 Minutes Intravenous  Once 09/24/17 2335 09/25/17 0219      Medications:  Scheduled: . azithromycin  500 mg Oral Daily  . ethambutol  15 mg/kg Oral Daily  . feeding supplement  1 Container Oral TID BM  . fluconazole  100 mg Oral Daily  . pantoprazole  40 mg Oral BID  . sodium chloride flush  3 mL Intravenous Q12H  . sulfamethoxazole-trimethoprim  1 tablet Oral Daily    Objective: Vital signs in last 24 hours: Temp:  [99 F (37.2 C)-103.5 F (39.7 C)] 103 F (39.4 C) (09/30 0800) Pulse Rate:  [91-125] 122 (09/30 0930) Resp:  [18-26] 23 (09/30 0930) BP: (66-109)/(28-65) 96/63 (09/30 0915) SpO2:  [100 %] 100 % (09/30 0930)  Weight:  [64.3 kg (141 lb 12.1 oz)] 64.3 kg (141 lb 12.1 oz) (09/30 0754)   General appearance: alert, cooperative and rigors Resp: clear to auscultation bilaterally Cardio: regular rate and rhythm GI: normal findings: bowel sounds normal and soft, non-tender Extremities: edema none  Lab Results  Recent Labs  09/27/17 0418  09/28/17 0311 09/28/17 0947  WBC 3.0*  < > 5.1 7.6  HGB 6.8*  < > 7.5* 7.6*  HCT 21.4*  < > 22.8* 23.6*  NA 132*  --  133*  --   K 3.5  --  3.8  --   CL 105  --  109  --   CO2 22  --  19*  --   BUN <5*  --  <5*  --   CREATININE 0.71  --  1.00  --   < > = values in this interval not displayed. Liver Panel  Recent Labs  09/27/17 0418  PROT 5.2*  ALBUMIN 1.5*  AST 19  ALT 9*  ALKPHOS 50  BILITOT 0.8   Sedimentation Rate No results for input(s): ESRSEDRATE in the last 72  hours. C-Reactive Protein No results for input(s): CRP in the last 72 hours.  Microbiology: Recent Results (from the past 240 hour(s))  Blood Culture (routine x 2)     Status: None (Preliminary result)   Collection Time: 09/25/17 12:06 AM  Result Value Ref Range Status   Specimen Description BLOOD BLOOD RIGHT FOREARM  Final   Special Requests   Final    BOTTLES DRAWN AEROBIC AND ANAEROBIC Blood Culture adequate volume   Culture NO GROWTH 2 DAYS  Final   Report Status PENDING  Incomplete  Blood Culture (routine x 2)     Status: None (Preliminary result)   Collection Time: 09/25/17 12:36 AM  Result Value Ref Range Status   Specimen Description BLOOD LEFT WRIST  Final   Special Requests   Final    BOTTLES DRAWN AEROBIC AND ANAEROBIC Blood Culture adequate volume   Culture NO GROWTH 2 DAYS  Final   Report Status PENDING  Incomplete  MRSA PCR Screening     Status: None   Collection Time: 09/25/17  6:50 AM  Result Value Ref Range Status   MRSA by PCR NEGATIVE NEGATIVE Final    Comment:        The GeneXpert MRSA Assay (FDA approved for NASAL specimens only), is one component of a comprehensive MRSA colonization surveillance program. It is not intended to diagnose MRSA infection nor to guide or monitor treatment for MRSA infections.     Studies/Results: Ct Abdomen Pelvis W Contrast  Result Date: 09/27/2017 CLINICAL DATA:  Generalized abdominal pain, weight loss. EXAM: CT ABDOMEN AND PELVIS WITH CONTRAST TECHNIQUE: Multidetector CT imaging of the abdomen and pelvis was performed using the standard protocol following bolus administration of intravenous contrast. CONTRAST:  ISOVUE-300 IOPAMIDOL (ISOVUE-300) INJECTION 61% COMPARISON:  None. FINDINGS: Lower chest: No acute abnormality. Hepatobiliary: No focal liver abnormality is seen. No gallstones, gallbladder wall thickening, or biliary dilatation. Pancreas: Unremarkable. No pancreatic ductal dilatation or surrounding  inflammatory changes. Spleen: Normal in size without focal abnormality. Adrenals/Urinary Tract: Adrenal glands are unremarkable. Kidneys are normal, without renal calculi, focal lesion, or hydronephrosis. Bladder is unremarkable. Stomach/Bowel: Stomach is within normal limits. Appendix appears normal. No evidence of bowel wall thickening, distention, or inflammatory changes. Vascular/Lymphatic: No significant vascular findings are present. No enlarged abdominal or pelvic lymph nodes. Reproductive: Prostate is unremarkable. Other: Moderate anasarca is noted.  No definite hernia is noted. Musculoskeletal: No acute or significant osseous findings. IMPRESSION: Moderate anasarca is noted. No other abnormality seen in the abdomen or pelvis. Electronically Signed   By: Lupita Raider, M.D.   On: 09/27/2017 20:31   Dg Chest Port 1 View  Result Date: 09/28/2017 CLINICAL DATA:  Fever and hypotension. EXAM: PORTABLE CHEST 1 VIEW COMPARISON:  09/24/2017 FINDINGS: The cardiomediastinal silhouette is unremarkable. There is no evidence of focal airspace disease, pulmonary edema, suspicious pulmonary nodule/mass, pleural effusion, or pneumothorax. No acute bony abnormalities are identified. IMPRESSION: No active disease. Electronically Signed   By: Harmon Pier M.D.   On: 09/28/2017 09:47     Assessment/Plan: AIDS VDRF Hypotension Suspected MAI (wt loss, fever, anemia) Thrush Anemia Protein Calorie Malnutrition, severe  Has not gotten ART yet, will hold 2 weeks after start of MAI therapy Will restart vanco Continue MAI rx Check cardiac echo for LVEF (anemia related, aids cardiomyopathy, sepsis?) Anemia due to dilution, MAI Will ask IR for bone marrow Cx/bx for MAI (explained to pt and family) My great appreciation to CCM  Total days of antibiotics:4  levaquin, vancomycin, diflucan Bactrim prophylaxis dose         Johny Sax MD, FACP Infectious Diseases (pager) [phone] [email or web address] 09/28/2017, 10:35 AM  LOS: 3 days

## 2017-09-28 NOTE — Consult Note (Signed)
Chief Complaint: AIDS/sepsis  Referring Physician:Dr. Lita Mains  Supervising Physician: Sandi Mariscal  Patient Status: [facility] - In-pt  HPI: Anthony Yang is a 31 y.o. male who was admitted secondary to many weeks of weakness and fatigue.  His hgb was found to be 5.4.  He was also tachycardic and febrile.  He has been found to have SIRS/sepsis. He does have a history of HIV/AIDS which has not been treated since 2016.  ID was consulted and has requested a bone marrow biopsy for cultures for AFB and MAI as a source of current infection.  Past Medical History:  Past Medical History:  Diagnosis Date  . AIDS (acquired immune deficiency syndrome) ([place])    hx/notes 09/25/2017  . Anemia   . History of blood transfusion 09/25/2017   "anemic"  . HIV (human immunodeficiency virus infection) ([place])    hx/notes 09/24/2017    Past Surgical History:  Past Surgical History:  Procedure Laterality Date  . FRACTURE SURGERY    . ORIF FINGER / THUMB FRACTURE Right   . WRIST FRACTURE SURGERY Left     Family History: History reviewed. No pertinent family history.  Social History:  reports that he has never smoked. He has never used smokeless tobacco. He reports that he drinks about 1.8 oz of alcohol per week . He reports that he does not use drugs.  Allergies:  Allergies  Allergen Reactions  . Penicillins Hives    Medications: Medications reviewed in epic  Please HPI for pertinent positives, otherwise complete 10 system ROS negative.  Mallampati Score: MD Evaluation Airway: WNL Heart: Other (comments) Heart  comments: tachycardic Abdomen: WNL Chest/ Lungs: WNL ASA  Classification: 3 Mallampati/Airway Score: Two  Physical Exam: BP 102/66   Pulse (!) 131   Temp (!) 101 F (38.3 C) (Oral)   Resp (!) 29   Ht _0  (1.676 m)   Wt 141 lb 12.1 oz (64.3 kg)   SpO2 100%   BMI 22.88 kg/m  Body mass index is 22.88 kg/m. General: pleasant, thin black male who is laying in bed  shaking. HEENT: head is normocephalic, atraumatic.  Sclera are noninjected.  PERRL.  Ears and nose without any masses or lesions.  Mouth is pink  Heart: tachycardic.  Normal s1,s2. No obvious murmurs, gallops, or rubs noted.  Palpable radial pulses bilaterally Lungs: CTAB, no wheezes, rhonchi, or rales noted.  Respiratory effort nonlabored Abd: soft, NT, ND, +BS, no masses, hernias, or organomegaly Psych: A&Ox3 with an appropriate affect.   Labs: Results for orders placed or performed during the hospital encounter of 09/24/17 (from the past 48 hour(s))  Comprehensive metabolic panel     Status: Abnormal   Collection Time: 09/27/17  4:18 AM  Result Value Ref Range   Sodium 132 (L) 135 - 145 mmol/L   Potassium 3.5 3.5 - 5.1 mmol/L   Chloride 105 101 - 111 mmol/L   CO2 22 22 - 32 mmol/L   Glucose, Bld 111 (H) 65 - 99 mg/dL   BUN <5 (L) 6 - 20 mg/dL   Creatinine, Ser 0.71 0.61 - 1.24 mg/dL   Calcium 6.9 (L) 8.9 - 10.3 mg/dL   Total Protein 5.2 (L) 6.5 - 8.1 g/dL   Albumin 1.5 (L) 3.5 - 5.0 g/dL   AST 19 15 - 41 U/L   ALT 9 (L) 17 - 63 U/L   Alkaline Phosphatase 50 38 - 126 U/L   Total Bilirubin 0.8 0.3 - 1.2 mg/dL  GFR calc non Af Amer >60 >60 mL/min   GFR calc Af Amer >60 >60 mL/min    Comment: (NOTE) The eGFR has been calculated using the CKD EPI equation. This calculation has not been validated in all clinical situations. eGFR's persistently <60 mL/min signify possible Chronic Kidney Disease.    Anion gap 5 5 - 15  CBC     Status: Abnormal   Collection Time: 09/27/17  4:18 AM  Result Value Ref Range   WBC 3.0 (L) 4.0 - 10.5 K/uL   RBC 2.71 (L) 4.22 - 5.81 MIL/uL   Hemoglobin 6.8 (LL) 13.0 - 17.0 g/dL    Comment: REPEATED TO VERIFY CRITICAL RESULT CALLED TO, READ BACK BY AND VERIFIED WITH: N.CAGUIOA,RN 0517 09/27/17 M.CAMPBELL    HCT 21.4 (L) 39.0 - 52.0 %   MCV 79.0 78.0 - 100.0 fL   MCH 25.1 (L) 26.0 - 34.0 pg   MCHC 31.8 30.0 - 36.0 g/dL   RDW 19.8 (H) 11.5 - 15.5  %   Platelets 127 (L) 150 - 400 K/uL  Prepare RBC     Status: None   Collection Time: 09/27/17  5:35 AM  Result Value Ref Range   Order Confirmation ORDER PROCESSED BY BLOOD BANK   CBC     Status: Abnormal   Collection Time: 09/27/17 10:24 AM  Result Value Ref Range   WBC 3.1 (L) 4.0 - 10.5 K/uL   RBC 3.16 (L) 4.22 - 5.81 MIL/uL   Hemoglobin 8.5 (L) 13.0 - 17.0 g/dL   HCT 25.2 (L) 39.0 - 52.0 %   MCV 79.7 78.0 - 100.0 fL   MCH 26.9 26.0 - 34.0 pg   MCHC 33.7 30.0 - 36.0 g/dL   RDW 19.2 (H) 11.5 - 15.5 %   Platelets 126 (L) 150 - 400 K/uL  CBC     Status: Abnormal   Collection Time: 09/27/17  7:02 PM  Result Value Ref Range   WBC 3.4 (L) 4.0 - 10.5 K/uL   RBC 3.09 (L) 4.22 - 5.81 MIL/uL   Hemoglobin 7.9 (L) 13.0 - 17.0 g/dL   HCT 24.6 (L) 39.0 - 52.0 %   MCV 79.6 78.0 - 100.0 fL   MCH 25.6 (L) 26.0 - 34.0 pg   MCHC 32.1 30.0 - 36.0 g/dL   RDW 18.8 (H) 11.5 - 15.5 %   Platelets 126 (L) 150 - 400 K/uL  RPR     Status: None   Collection Time: 09/27/17  7:02 PM  Result Value Ref Range   RPR Ser Ql Non Reactive Non Reactive    Comment: (NOTE) Performed At: [facility] [place], [place] [id number] Lindon Romp MD VH:[id number]   Occult blood card to lab, stool     Status: None   Collection Time: 09/27/17  8:59 PM  Result Value Ref Range   Fecal Occult Bld NEGATIVE NEGATIVE  CBC     Status: Abnormal   Collection Time: 09/28/17  3:11 AM  Result Value Ref Range   WBC 5.1 4.0 - 10.5 K/uL   RBC 2.88 (L) 4.22 - 5.81 MIL/uL   Hemoglobin 7.5 (L) 13.0 - 17.0 g/dL   HCT 22.8 (L) 39.0 - 52.0 %   MCV 79.2 78.0 - 100.0 fL   MCH 26.0 26.0 - 34.0 pg   MCHC 32.9 30.0 - 36.0 g/dL   RDW 19.0 (H) 11.5 - 15.5 %   Platelets 113 (L) 150 - 400 K/uL    Comment:  SPECIMEN CHECKED FOR CLOTS REPEATED TO VERIFY PLATELET COUNT CONFIRMED BY SMEAR   Basic metabolic panel     Status: Abnormal   Collection Time: 09/28/17  3:11 AM  Result Value Ref Range   Sodium 133  (L) 135 - 145 mmol/L   Potassium 3.8 3.5 - 5.1 mmol/L   Chloride 109 101 - 111 mmol/L   CO2 19 (L) 22 - 32 mmol/L   Glucose, Bld 97 65 - 99 mg/dL   BUN <5 (L) 6 - 20 mg/dL   Creatinine, Ser 1.00 0.61 - 1.24 mg/dL   Calcium 6.9 (L) 8.9 - 10.3 mg/dL   GFR calc non Af Amer >60 >60 mL/min   GFR calc Af Amer >60 >60 mL/min    Comment: (NOTE) The eGFR has been calculated using the CKD EPI equation. This calculation has not been validated in all clinical situations. eGFR's persistently <60 mL/min signify possible Chronic Kidney Disease.    Anion gap 5 5 - 15  Prepare RBC     Status: None   Collection Time: 09/28/17  6:11 AM  Result Value Ref Range   Order Confirmation ORDER PROCESSED BY BLOOD BANK   MRSA PCR Screening     Status: None   Collection Time: 09/28/17  8:07 AM  Result Value Ref Range   MRSA by PCR NEGATIVE NEGATIVE    Comment:        The GeneXpert MRSA Assay (FDA approved for NASAL specimens only), is one component of a comprehensive MRSA colonization surveillance program. It is not intended to diagnose MRSA infection nor to guide or monitor treatment for MRSA infections.   Glucose, capillary     Status: None   Collection Time: 09/28/17  8:51 AM  Result Value Ref Range   Glucose-Capillary 83 65 - 99 mg/dL   Comment 1 Capillary Specimen    Comment 2 Notify RN   Procalcitonin - Baseline     Status: None   Collection Time: 09/28/17  9:05 AM  Result Value Ref Range   Procalcitonin 3.47 ng/mL    Comment:        Interpretation: PCT > 2 ng/mL: Systemic infection (sepsis) is likely, unless other causes are known. (NOTE)         ICU PCT Algorithm               Non ICU PCT Algorithm    ----------------------------     ------------------------------         PCT < 0.25 ng/mL                 PCT < 0.1 ng/mL     Stopping of antibiotics            Stopping of antibiotics       strongly encouraged.               strongly encouraged.    ----------------------------      ------------------------------       PCT level decrease by               PCT < 0.25 ng/mL       >= 80% from peak PCT       OR PCT 0.25 - 0.5 ng/mL          Stopping of antibiotics  encouraged.     Stopping of antibiotics           encouraged.    ----------------------------     ------------------------------       PCT level decrease by              PCT >= 0.25 ng/mL       < 80% from peak PCT        AND PCT >= 0.5 ng/mL            Continuing antibiotics                                               encouraged.       Continuing antibiotics            encouraged.    ----------------------------     ------------------------------     PCT level increase compared          PCT > 0.5 ng/mL         with peak PCT AND          PCT >= 0.5 ng/mL             Escalation of antibiotics                                          strongly encouraged.      Escalation of antibiotics        strongly encouraged.   Lactic acid, plasma     Status: None   Collection Time: 09/28/17  9:46 AM  Result Value Ref Range   Lactic Acid, Venous 1.2 0.5 - 1.9 mmol/L  Cortisol     Status: None   Collection Time: 09/28/17  9:46 AM  Result Value Ref Range   Cortisol, Plasma 25.4 ug/dL    Comment: (NOTE) AM    6.7 - 22.6 ug/dL PM   <10.0       ug/dL   CBC     Status: Abnormal   Collection Time: 09/28/17  9:47 AM  Result Value Ref Range   WBC 7.6 4.0 - 10.5 K/uL   RBC 2.96 (L) 4.22 - 5.81 MIL/uL   Hemoglobin 7.6 (L) 13.0 - 17.0 g/dL   HCT 23.6 (L) 39.0 - 52.0 %   MCV 79.7 78.0 - 100.0 fL   MCH 25.7 (L) 26.0 - 34.0 pg   MCHC 32.2 30.0 - 36.0 g/dL   RDW 19.2 (H) 11.5 - 15.5 %   Platelets 119 (L) 150 - 400 K/uL    Comment: CONSISTENT WITH PREVIOUS RESULT    Imaging: Ct Abdomen Pelvis W Contrast  Result Date: 09/27/2017 CLINICAL DATA:  Generalized abdominal pain, weight loss. EXAM: CT ABDOMEN AND PELVIS WITH CONTRAST TECHNIQUE: Multidetector CT imaging of the abdomen  and pelvis was performed using the standard protocol following bolus administration of intravenous contrast. CONTRAST:  18m ISOVUE-300 IOPAMIDOL (ISOVUE-300) INJECTION 61% COMPARISON:  None. FINDINGS: Lower chest: No acute abnormality. Hepatobiliary: No focal liver abnormality is seen. No gallstones, gallbladder wall thickening, or biliary dilatation. Pancreas: Unremarkable. No pancreatic ductal dilatation or surrounding inflammatory changes. Spleen: Normal in size without focal abnormality. Adrenals/Urinary Tract: Adrenal glands are unremarkable. Kidneys are normal, without renal calculi, focal lesion, or hydronephrosis. Bladder is unremarkable. Stomach/Bowel:  Stomach is within normal limits. Appendix appears normal. No evidence of bowel wall thickening, distention, or inflammatory changes. Vascular/Lymphatic: No significant vascular findings are present. No enlarged abdominal or pelvic lymph nodes. Reproductive: Prostate is unremarkable. Other: Moderate anasarca is noted.  No definite hernia is noted. Musculoskeletal: No acute or significant osseous findings. IMPRESSION: Moderate anasarca is noted. No other abnormality seen in the abdomen or pelvis. Electronically Signed   By: Marijo Conception, M.D.   On: 09/27/2017 20:31   Dg Chest Port 1 View  Result Date: 09/28/2017 CLINICAL DATA:  Fever and hypotension. EXAM: PORTABLE CHEST 1 VIEW COMPARISON:  09/24/2017 FINDINGS: The cardiomediastinal silhouette is unremarkable. There is no evidence of focal airspace disease, pulmonary edema, suspicious pulmonary nodule/mass, pleural effusion, or pneumothorax. No acute bony abnormalities are identified. IMPRESSION: No active disease. Electronically Signed   By: Margarette Canada M.D.   On: 09/28/2017 09:47    Assessment/Plan 1. Sepsis 2. HIV/AIDS  ID has evaluated the patient asked for a bone marrow biopsy for the specimen to be sent for cultures to rule out AFB or MAI.  We will have to contact all parties needed for  this biopsy tomorrow morning to determine if this can be done tomorrow or if we will have to schedule on Tuesday.  He will be NPO p MN in preparation for tomorrow, though. Risks and benefits discussed with the patient including, but not limited to bleeding, infection, damage to adjacent structures or low yield requiring additional tests. All of the patient's questions were answered, patient is agreeable to proceed. Consent signed and in chart.  Thank you for this interesting consult.  I greatly enjoyed meeting Anthony Yang and look forward to participating in their care.  A copy of this report was sent to the requesting provider on this date.  Electronically Signed: Henreitta Cea 09/28/2017, 11:49 AM   I spent a total of 40 Minutes  in face to face in clinical consultation, greater than 50% of which was counseling/coordinating care for sepsis/AIDS

## 2017-09-28 NOTE — Progress Notes (Signed)
Rechecked patient's VS at 0607; temp 103.5 orally, BP 66/32, HR 123, R 18, O2 100% on RA. Patient stated he was feeling fine; no complaints and no signs of distress. Notified Blount via text page with patient's VS and Hgb 7.5. Notified Rapid Response nurse at [phone] concerning vital signs and that MD paged. Notified charge nurse of patient's VS and condition, and that Blount and Rapid Response nurse notified. Blount returned call and ordered blood transfusion; stated Dr Antionette Char would come to assess patient. Rapid Response and Dr Antionette Char came to assess patient. Opyd cancelled order for blood transfusion and ordered additional 1L bolus. Opyd stated patient to be transferred to ICU. Spoke with patient and family concerning transfer to ICU. Asked patient to reconsider placement of ice packs since previous refusal; patient agreed to ice packs but refused cooling blanket at this time.

## 2017-09-28 NOTE — Progress Notes (Signed)
Report called to April RN in [facility] ICU. Patient transferred to [facility] ICU at 0740 with patient's parents accompanying; placed in room 2M02 on arrival.

## 2017-09-28 NOTE — Progress Notes (Signed)
Notified Rapid Response of patient's vital signs and condition. Notified [facility] Blount a second time via text page concerning patient's vital signs. Still awaiting response and further orders from [facility].

## 2017-09-28 NOTE — Progress Notes (Signed)
1L bolus completed. Rechecked patient's VS; temp 103.0, BP 89/42, HR 124, R 18, O2 100% on RA. Notified Blount via text page. Awaiting response and further orders.

## 2017-09-28 NOTE — Progress Notes (Signed)
Blount returned call. 1L fluid bolus ordered and  tylenol PO. Will administer.

## 2017-09-28 NOTE — Progress Notes (Signed)
Pharmacy Antibiotic Note  Anthony Yang is a 31 y.o. male admitted on 09/24/2017 with fever  Plan: - resume vanc 750 q8 - monitor renal fx, vt prn  Height:  (167.6 cm) Weight: 141 lb 12.1 oz (64.3 kg) IBW/kg (Calculated) : 63.8  Temp (24hrs), Avg:101.9 F (38.8 C), Min:99 F (37.2 C), Max:103.5 F (39.7 C)   Recent Labs Lab 09/24/17 2227 09/24/17 2229 09/25/17 0235  09/27/17 0418 09/27/17 1024 09/27/17 1902 09/28/17 0311 09/28/17 0947  WBC 6.6  --   --   < > 3.0* 3.1* 3.4* 5.1 7.6  CREATININE 0.90  --  0.77  --  0.71  --   --  1.00  --   LATICACIDVEN  --  1.12  --   --   --   --   --   --   --   < > = values in this interval not displayed.  Estimated Creatinine Clearance: 96.6 mL/min (by C-G formula based on SCr of 1 mg/dL).    Allergies  Allergen Reactions  . Penicillins Hives   Isaac Bliss, PharmD, BCPS, BCCCP Clinical Pharmacist Clinical phone for 09/28/2017 from 7a-3:30p: [phone] If after 3:30p, please call main pharmacy at: [phone] 09/28/2017 10:44 AM

## 2017-09-28 NOTE — Progress Notes (Addendum)
Pt has been hypotensive and received 2 liters NS overnight. He is hypotensive now, MAP 40. There was anasarca noted on CT abd/pelvis from earlier today.   Pt mentating well, has no complaints and specifically denies CP, HA, or lightheadedness.   There is no crackles on auscultation and respirations unlabored.   Will give another liter NS bolus now and discuss with PCCM.   ADDENDUM:  Discussed with PCCM, their care much appreciated. Start low-dose neosynephrine and transfer to ICU.

## 2017-09-28 NOTE — Progress Notes (Signed)
Orders placed by [facility] for patient to receive additional 1L NS bolus and rate change of IV maintenance fluid NS @ 76ml/hr. 1L bolus started and rate changed on IV maintenance fluid.

## 2017-09-28 NOTE — Progress Notes (Signed)
Patient's temperature 103.1 orally, BP 83/34, HR 119, O2 100% on RA, respirations 20. Patient states he feels fine, he just feels hot. Notified MD Blount via text page concerning patient's VS. Awaiting response and further orders.

## 2017-09-28 NOTE — Progress Notes (Signed)
Notified [facility] second time via text page concerning patient's VS after completion of 1L bolus. At 0205, temp 103.0, BP 89/42, HR 124. At 0225, temp 103.1, BP 86/31, HR 123. [facility] returned call, acknowledged patient's VS. No further orders given at this time. Will continue to monitor patient.

## 2017-09-28 NOTE — Consult Note (Signed)
PULMONARY / CRITICAL CARE MEDICINE   Name: Anthony Yang MRN: [id number] DOB: 1986/04/26    ADMISSION DATE:  09/24/2017 CONSULTATION DATE:  09/28/17  REFERRING MD:  Dr Sharon Seller, TRH  CHIEF COMPLAINT:  Shock  HISTORY OF PRESENT ILLNESS:   31 year old man with a history of HIV/AIDS diagnosed in 2013, not on medical therapy since 2016. He presented on 9/26 with weakness, malaise, weight loss. Found to be febrile, tachycardic and profoundly anemic at that time. Hemoglobin 5.4. Chest x-ray was clear, CT abdomen/pelvis unremarkable. He received blood products, was started on empiric broad-spectrum antibiotics. Hemoglobin appeared to stabilize with transfusion. Continued levaquin, his vancomycin and aztreonam were stopped on 9/29, continued on Bactrim prophylaxis, fluconazole for thrush, started azithromycin plus ethambutol for possible MAIC. Finally, his antiretrovirals were restarted, first dose planned for this morning.  Overnight 9/29 he had recurrent fever, associated hypotension. He remained hypotensive even with aggressive IV fluid boluses, 3 L. Lactate normal. Given his persistent shock moved to the ICU 9/30 a.m.  PAST MEDICAL HISTORY :  He  has a past medical history of AIDS (acquired immune deficiency syndrome) (HCC); Anemia; History of blood transfusion (09/25/2017); and HIV (human immunodeficiency virus infection) (HCC).  PAST SURGICAL HISTORY: He  has a past surgical history that includes Fracture surgery; Wrist fracture surgery (Left); and ORIF finger / thumb fracture (Right).  Allergies  Allergen Reactions  . Penicillins Hives    No current facility-administered medications on file prior to encounter.    No current outpatient prescriptions on file prior to encounter.    FAMILY HISTORY:  His has no family status information on file.    SOCIAL HISTORY: He  reports that he has never smoked. He has never used smokeless tobacco. He reports that he drinks about 1.8 oz of alcohol  per week . He reports that he does not use drugs.  REVIEW OF SYSTEMS:   Intermittent diarrhea Fever, chill last night Overall weakness, but better since arrival to hospital  SUBJECTIVE:  Feels weak, denies any pain  VITAL SIGNS: BP (!) 81/44   Pulse (!) 120   Temp (!) 103 F (39.4 C) (Oral)   Resp (!) 24   Ht  (1.676 m)   Wt 64.3 kg (141 lb 12.1 oz)   SpO2 100%   BMI 22.88 kg/m   HEMODYNAMICS:    VENTILATOR SETTINGS:    INTAKE / OUTPUT: I/O last 3 completed shifts: In: 3276.3 [P.O.:360; I.V.:316.3; Blood:350; IV Piggyback:2250] Out: 1176 [Urine:1175; Stool:1]  PHYSICAL EXAMINATION: General:  Thin man, no distress Neuro:  Awake, alert, interacting, follows commands HEENT:  Some white plaquing on the tongue, no other oral lesions, pupils equal and reactive Cardiovascular:  Regular, tachycardic, sinus, no murmur; no lower extremity edema Lungs:  Clear bilaterally Abdomen:  Soft, nontender, positive bowel sounds Musculoskeletal:  No deformities Skin:  No apparent rashes or nodules  LABS:  BMET  Recent Labs Lab 09/25/17 0235 09/27/17 0418 09/28/17 0311  NA 125* 132* 133*  K 4.0 3.5 3.8  CL 97* 105 109  CO2 19* 22 19*  BUN <5* <5* <5*  CREATININE 0.77 0.71 1.00  GLUCOSE 134* 111* 97    Electrolytes  Recent Labs Lab 09/25/17 0235 09/27/17 0418 09/28/17 0311  CALCIUM 6.9* 6.9* 6.9*    CBC  Recent Labs Lab 09/27/17 1024 09/27/17 1902 09/28/17 0311  WBC 3.1* 3.4* 5.1  HGB 8.5* 7.9* 7.5*  HCT 25.2* 24.6* 22.8*  PLT 126* 126* 113*    Coag's  Recent Labs Lab 09/25/17 0006  APTT 31  INR 1.28    Sepsis Markers  Recent Labs Lab 09/24/17 2229  LATICACIDVEN 1.12    ABG No results for input(s): PHART, PCO2ART, PO2ART in the last 168 hours.  Liver Enzymes  Recent Labs Lab 09/24/17 2227 09/27/17 0418  AST 40 19  ALT 12* 9*  ALKPHOS 66 50  BILITOT 1.3* 0.8  ALBUMIN 2.2* 1.5*    Cardiac Enzymes No results for  input(s): TROPONINI, PROBNP in the last 168 hours.  Glucose  Recent Labs Lab 09/22/17 1329 09/25/17 0823 09/25/17 1215 09/25/17 1940 09/25/17 2321 09/26/17 0353  GLUCAP 109* 100* 87 90 110* 103*    Imaging Ct Abdomen Pelvis W Contrast  Result Date: 09/27/2017 CLINICAL DATA:  Generalized abdominal pain, weight loss. EXAM: CT ABDOMEN AND PELVIS WITH CONTRAST TECHNIQUE: Multidetector CT imaging of the abdomen and pelvis was performed using the standard protocol following bolus administration of intravenous contrast. CONTRAST:  ISOVUE-300 IOPAMIDOL (ISOVUE-300) INJECTION 61% COMPARISON:  None. FINDINGS: Lower chest: No acute abnormality. Hepatobiliary: No focal liver abnormality is seen. No gallstones, gallbladder wall thickening, or biliary dilatation. Pancreas: Unremarkable. No pancreatic ductal dilatation or surrounding inflammatory changes. Spleen: Normal in size without focal abnormality. Adrenals/Urinary Tract: Adrenal glands are unremarkable. Kidneys are normal, without renal calculi, focal lesion, or hydronephrosis. Bladder is unremarkable. Stomach/Bowel: Stomach is within normal limits. Appendix appears normal. No evidence of bowel wall thickening, distention, or inflammatory changes. Vascular/Lymphatic: No significant vascular findings are present. No enlarged abdominal or pelvic lymph nodes. Reproductive: Prostate is unremarkable. Other: Moderate anasarca is noted.  No definite hernia is noted. Musculoskeletal: No acute or significant osseous findings. IMPRESSION: Moderate anasarca is noted. No other abnormality seen in the abdomen or pelvis. Electronically Signed   By: Lupita Raider, M.D.   On: 09/27/2017 20:31     STUDIES:  CT abdomen pelvis 9/29 >> moderate anasarca, no other acute abnormality noted  CULTURES: Blood 9/27 >>  Urine 9/30 >>  MRSA screen 9/30 >>    ANTIBIOTICS: Levaquin 9/27 >>  Aztreonam 9/27 >> 9/29 Vancomycin 9/27 >> 9/29 Azithromycin 9/29 >>   Ethambutol 9/29 >>  Bactrim 9/29 >>  Fluconazole 9/29 >>   ANTIRETROVIRALS: Genvoya 9/30 >>   SIGNIFICANT EVENTS:  LINES/TUBES:  DISCUSSION: 31 year old man with AIDS admitted with profound anemia (likely chronic), fever of unclear origin. Moved to the ICU 9/30 with shock. Suspect sepsis superimposed on hypovolemia, hemorrhagic components.  ASSESSMENT / PLAN:  PULMONARY A: No acute issues P:   pulm hygiene CXR now to eval for evolving infiltrates  CARDIOVASCULAR A:  Shock, components of hypovolemic, hemorrhagic. Suspect also septic shock based on fever and SIRS. Source unclear P:  Phenylephrine initiated 9/30 goal MAP > 65; place CVC if needs increase Continue IVF boluses Transfusion goal hemoglobin greater than 7.0 as below abx as below  RENAL A:   Non-AG metabolic acidosis P:   Follow BMP, UOP  GASTROINTESTINAL A:   Chronic diarrhea Protein calorie malnutrition SUP P:   Pantoprazole bid  HEMATOLOGIC A:   Severe anemia, likely malnutrition, Fe-deficiency, chronic disease. No evidence active blood loss P:  Transfuse for Hgb goal > 7.0 Serial CBC Note his discussion with Dr. Sharon Seller regarding GI evaluation. Agree that his anemia is likely chronic. We will revisit possible GI consult as we go forward.  INFECTIOUS A:   Fever of unknown origin HIV with AIDS, no treatment since 2016 No reason to suspect immune reconstitution syndrome as his  antiretrovirals ordered for 9/30 Thrush P:   Follow blood cultures CXR now Urine culture now Pro-calcitonin now We will review current antibiotics with Dr Ninetta Lights, determine whether we need to broaden. Currently on Levaquin plus empiric therapy for disseminated MAIC Bactrim PCP prophylaxis Fluconazole for thrush Hold initiation of his antiretroviral meds until we review potential risks of IRS with ID  ENDOCRINE A:   Possible adrenal insufficiency  P:   Check random cortisol now  NEUROLOGIC A:   No acute  issues P:   RASS goal: 0 Follow    FAMILY  - Updates: Updated patient and father at bedside 9/30  - Inter-disciplinary family meet or Palliative Care meeting due by:  10/04/17  Independent CC time 45 minutes   Levy Pupa, MD, PhD 09/28/2017, 9:26 AM [facility] [phone] or if no answer [phone]

## 2017-09-28 NOTE — Progress Notes (Addendum)
RN called to make me aware patient has an oral temp of 103, BP 89/42, HR 124. Pt asymptomatic. [facility] NP paged with no return call.  [facility] NP placed new orders for 1 L NS bolus and Tylenol 500 mg PO. Pt refused ice packs.   [id number] RN called due to no change in temp or SBP. Advised to call MD and ask for additional bolus. Pt remains asymptomatic. New order for a second 1L bolus  0608 RN called for an automatic and manual SBP 66-68. HR 123, RR 16, 100% RA, oral temp 103.5. Hgb 7.5. MD paged prior to calling me. New orders for blood transfusion. I spoke wit [facility] NP for a concern with elevated temp spite fluid bolus and tylenol. She paged Dr. Antionette Char. Pt asymptomatic, a/o x4, denies any concern/pain, states "I feel fine."   Dr. Antionette Char to bedside. PCCM contacted, pt is to be transferred to ICU. Additional 1L bolus started.

## 2017-09-29 ENCOUNTER — Inpatient Hospital Stay (HOSPITAL_COMMUNITY): Payer: Medicaid Other

## 2017-09-29 DIAGNOSIS — Z79899 Other long term (current) drug therapy: Secondary | ICD-10-CM

## 2017-09-29 DIAGNOSIS — B370 Candidal stomatitis: Secondary | ICD-10-CM

## 2017-09-29 DIAGNOSIS — R0600 Dyspnea, unspecified: Secondary | ICD-10-CM

## 2017-09-29 DIAGNOSIS — I361 Nonrheumatic tricuspid (valve) insufficiency: Secondary | ICD-10-CM

## 2017-09-29 LAB — BPAM RBC
BLOOD PRODUCT EXPIRATION DATE: 201810082359
BLOOD PRODUCT EXPIRATION DATE: 201810092359
Blood Product Expiration Date: 201810082359
Blood Product Expiration Date: 201810092359
ISSUE DATE / TIME: 201809270129
ISSUE DATE / TIME: 201809270451
ISSUE DATE / TIME: 201809290635
UNIT TYPE AND RH: 7300
UNIT TYPE AND RH: 7300
Unit Type and Rh: 7300
Unit Type and Rh: 7300

## 2017-09-29 LAB — CBC
HCT: 23.9 % — ABNORMAL LOW (ref 39.0–52.0)
Hemoglobin: 7.8 g/dL — ABNORMAL LOW (ref 13.0–17.0)
MCH: 26.1 pg (ref 26.0–34.0)
MCHC: 32.6 g/dL (ref 30.0–36.0)
MCV: 79.9 fL (ref 78.0–100.0)
PLATELETS: 91 10*3/uL — AB (ref 150–400)
RBC: 2.99 MIL/uL — ABNORMAL LOW (ref 4.22–5.81)
RDW: 19.8 % — AB (ref 11.5–15.5)
WBC: 5.4 10*3/uL (ref 4.0–10.5)

## 2017-09-29 LAB — GASTROINTESTINAL PANEL BY PCR, STOOL (REPLACES STOOL CULTURE)
ASTROVIRUS: NOT DETECTED
Adenovirus F40/41: NOT DETECTED
CYCLOSPORA CAYETANENSIS: NOT DETECTED
Campylobacter species: NOT DETECTED
Cryptosporidium: NOT DETECTED
ENTAMOEBA HISTOLYTICA: NOT DETECTED
ENTEROAGGREGATIVE E COLI (EAEC): NOT DETECTED
ENTEROTOXIGENIC E COLI (ETEC): NOT DETECTED
Enteropathogenic E coli (EPEC): NOT DETECTED
GIARDIA LAMBLIA: DETECTED — AB
NOROVIRUS GI/GII: NOT DETECTED
Plesimonas shigelloides: NOT DETECTED
Rotavirus A: NOT DETECTED
Salmonella species: NOT DETECTED
Sapovirus (I, II, IV, and V): NOT DETECTED
Shiga like toxin producing E coli (STEC): NOT DETECTED
Shigella/Enteroinvasive E coli (EIEC): NOT DETECTED
VIBRIO CHOLERAE: NOT DETECTED
VIBRIO SPECIES: NOT DETECTED
Yersinia enterocolitica: NOT DETECTED

## 2017-09-29 LAB — BASIC METABOLIC PANEL
Anion gap: 5 (ref 5–15)
BUN: 7 mg/dL (ref 6–20)
CALCIUM: 6.6 mg/dL — AB (ref 8.9–10.3)
CHLORIDE: 113 mmol/L — AB (ref 101–111)
CO2: 18 mmol/L — AB (ref 22–32)
CREATININE: 0.83 mg/dL (ref 0.61–1.24)
GFR calc Af Amer: >60 mL/min (ref >60)
GFR calc non Af Amer: >60 mL/min (ref >60)
GLUCOSE: 97 mg/dL (ref 65–99)
Potassium: 3.7 mmol/L (ref 3.5–5.1)
Sodium: 136 mmol/L (ref 135–145)

## 2017-09-29 LAB — TYPE AND SCREEN
ABO/RH(D): B POS
ANTIBODY SCREEN: NEGATIVE
UNIT DIVISION: 0
UNIT DIVISION: 0
UNIT DIVISION: 0
Unit division: 0

## 2017-09-29 LAB — URINE CULTURE: CULTURE: NO GROWTH

## 2017-09-29 LAB — PROCALCITONIN: Procalcitonin: 2.29 ng/mL

## 2017-09-29 LAB — MAGNESIUM: Magnesium: 1.4 mg/dL — ABNORMAL LOW (ref 1.7–2.4)

## 2017-09-29 LAB — LACTIC ACID, PLASMA: Lactic Acid, Venous: 0.9 mmol/L (ref 0.5–1.9)

## 2017-09-29 MED ORDER — ETHAMBUTOL HCL 400 MG PO TABS
1000.0000 mg | ORAL_TABLET | Freq: Every day | ORAL | Status: DC
  Administered 2017-09-30: 1000 mg via ORAL
  Filled 2017-09-29: qty 2

## 2017-09-29 MED ORDER — MAGNESIUM SULFATE 50 % IJ SOLN
6.0000 g | Freq: Once | INTRAVENOUS | Status: AC
  Administered 2017-09-29: 6 g via INTRAVENOUS
  Filled 2017-09-29: qty 12

## 2017-09-29 MED ORDER — FENTANYL CITRATE (PF) 100 MCG/2ML IJ SOLN
INTRAMUSCULAR | Status: AC
  Filled 2017-09-29: qty 4

## 2017-09-29 MED ORDER — MIDAZOLAM HCL 2 MG/2ML IJ SOLN
INTRAMUSCULAR | Status: AC
  Filled 2017-09-29: qty 4

## 2017-09-29 MED ORDER — FENTANYL CITRATE (PF) 100 MCG/2ML IJ SOLN
INTRAMUSCULAR | Status: AC | PRN
  Administered 2017-09-29: 25 ug via INTRAVENOUS
  Administered 2017-09-29: 50 ug via INTRAVENOUS

## 2017-09-29 MED ORDER — LIDOCAINE HCL 1 % IJ SOLN
INTRAMUSCULAR | Status: AC
  Filled 2017-09-29: qty 20

## 2017-09-29 MED ORDER — MIDAZOLAM HCL 2 MG/2ML IJ SOLN
INTRAMUSCULAR | Status: AC | PRN
  Administered 2017-09-29: 0.5 mg via INTRAVENOUS
  Administered 2017-09-29: 1 mg via INTRAVENOUS

## 2017-09-29 NOTE — Sedation Documentation (Signed)
Patient denies pain and is resting comfortably.  

## 2017-09-29 NOTE — Sedation Documentation (Signed)
Patient is resting comfortably. 

## 2017-09-29 NOTE — Progress Notes (Addendum)
PULMONARY / CRITICAL CARE MEDICINE   Name: Anthony Yang MRN: [id number] DOB: 16-Mar-1986    ADMISSION DATE:  09/24/2017 CONSULTATION DATE:  09/28/17   REFERRING MD:  Dr Thereasa Solo, TRH   CHIEF COMPLAINT:  Shock   HISTORY OF PRESENT ILLNESS:   31 year old man with a history of HIV/AIDS diagnosed in 2013, not on medical therapy since 2016. He presented on 9/26 with weakness, malaise, weight loss. Found to be febrile, tachycardic and profoundly anemic at that time. Hemoglobin 5.4.  Chest x-ray was clear, CT abdomen/pelvis unremarkable. He received blood products, was started on empiric broad-spectrum antibiotics. Hemoglobin appeared to stabilize with transfusion.  Continued levaquin, his vancomycin and aztreonam were stopped on 9/29, continued on Bactrim prophylaxis, fluconazole for thrush, started azithromycin plus ethambutol for possible MAIC. Finally, his antiretrovirals were restarted, first dose planned for this morning.   Overnight 9/29 had recurrent fever, associated hypotension. He remained hypotensive even with aggressive IV fluid boluses, 3 L. Lactate normal. Given his persistent shock moved to the ICU 9/30 a.m.   Antibiotics restarted.   SUBJECTIVE:  Febrile overnight to 101.5 F around 1AM.  Denies pain this morning   VITAL SIGNS: BP 102/66   Pulse 98   Temp 98.6 F (37 C) (Rectal)   Resp 18   Ht _0  (1.676 m)   Wt 141 lb 12.1 oz (64.3 kg)   SpO2 100%   BMI 22.88 kg/m   HEMODYNAMICS:    VENTILATOR SETTINGS: Not on a vent.   INTAKE / OUTPUT: I/O last 3 completed shifts: In: 6402.1 [P.O.:120; I.V.:2682.1; Other:1000; IV Piggyback:2600] Out: 1275 [Urine:1275]  PHYSICAL EXAMINATION: General:  Thin man, no distress  Neuro:  Awake, alert, interacting, follows commands  HEENT:  Some white plaquing on the tongue, no other oral lesions, pupils equal and reactive Cardiovascular:  Regular, tachycardic, sinus, no murmur; no lower extremity edema Lungs:  Clear  bilaterally Abdomen:  Soft, nontender, positive bowel sounds Musculoskeletal:  No deformities Skin:  No apparent rashes or nodules  LABS: LA 0.9 improving  Procalc 2.29 improving from 3.47  Na 136 this morning  Mag low 1.4  AM cortisol elev 25.4   BMET  Recent Labs Lab 09/27/17 0418 09/28/17 0311 09/29/17 0406  NA 132* 133* 136  K 3.5 3.8 3.7  CL 105 109 113*  CO2 22 19* 18*  BUN <5* <5* 7  CREATININE 0.71 1.00 0.83  GLUCOSE 111* 97 97   Electrolytes  Recent Labs Lab 09/27/17 0418 09/28/17 0311 09/29/17 0406  CALCIUM 6.9* 6.9* 6.6*  MG  --   --  1.4*   CBC  Recent Labs Lab 09/28/17 1514 09/28/17 2134 09/29/17 0406  WBC 7.3 5.3 5.4  HGB 8.0* 7.9* 7.8*  HCT 24.4* 24.0* 23.9*  PLT 109* 98* 91*   Coag's  Recent Labs Lab 09/25/17 0006  APTT 31  INR 1.28    Sepsis Markers  Recent Labs Lab 09/24/17 2229 09/28/17 0905 09/28/17 0946 09/29/17 0406  LATICACIDVEN 1.12  --  1.2 0.9  PROCALCITON  --  3.47  --  2.29    ABG No results for input(s): PHART, PCO2ART, PO2ART in the last 168 hours.  Liver Enzymes  Recent Labs Lab 09/24/17 2227 09/27/17 0418  AST 40 19  ALT 12* 9*  ALKPHOS 66 50  BILITOT 1.3* 0.8  ALBUMIN 2.2* 1.5*    Cardiac Enzymes No results for input(s): TROPONINI, PROBNP in the last 168 hours.  Glucose  Recent Labs Lab 09/25/17 0823 09/25/17  1215 09/25/17 1940 09/25/17 2321 09/26/17 0353 09/28/17 0851  GLUCAP 100* 87 90 110* 103* 83    Imaging Ct Bone Marrow Biopsy & Aspiration  Result Date: 09/29/2017 INDICATION: Sepsis.  HIV. EXAM: CT BONE MARROW BIOPSY AND ASPIRATION MEDICATIONS: None. ANESTHESIA/SEDATION: Fentanyl 75 mcg IV; Versed 1.5 mg IV Moderate Sedation Time:  13 The patient was continuously monitored during the procedure by the interventional radiology nurse under my direct supervision. FLUOROSCOPY TIME:  Fluoroscopy Time:  minutes  seconds ( mGy). COMPLICATIONS: None immediate. PROCEDURE: Informed  written consent was obtained from the patient after a thorough discussion of the procedural risks, benefits and alternatives. All questions were addressed. Maximal Sterile Barrier Technique was utilized including caps, mask, sterile gowns, sterile gloves, sterile drape, hand hygiene and skin antiseptic. A timeout was performed prior to the initiation of the procedure. Under CT guidance, a(n) 11 gauge guide needle was advanced into the right iliac bone via posterior approach. Aspirates and 2 cores were obtained. Samples were are reserved for pathology and culture. Post biopsy images demonstrate no hemorrhage. Patient tolerated the procedure well without complication. Vital sign monitoring by nursing staff during the procedure will continue as patient is in the special procedures unit for post procedure observation. FINDINGS: The images document guide needle placement within the right iliac bone. Post biopsy images demonstrate no hemorrhage. IMPRESSION: Successful CT-guided bone marrow aspirate and core. Electronically Signed   By: Marybelle Killings M.D.   On: 09/29/2017 10:28    STUDIES:  CT abdomen pelvis 9/29 >> moderate anasarca, no other acute abnormality noted Bone Marrow Biopsy 10/1 >>>  GI panel >>> FOBT Neg   CULTURES: Blood 9/27 >>  Urine 9/30 >>  MRSA screen 9/30 >>  Wound cx >>>  Stool cx >>>   ANTIBIOTICS: Levaquin 9/27 >> 9/29, 9/30> Aztreonam 9/27 >> 9/29 Vancomycin 9/27 >> 9/29 >> Azithromycin 9/29 >>  Ethambutol 9/29 >>  Bactrim 9/29 >>  Fluconazole 9/29 >>   ANTIRETROVIRALS: Genvoya 9/30 >>   SIGNIFICANT EVENTS:  LINES/TUBES:  DISCUSSION: 31 year old man with AIDS admitted with profound anemia (likely chronic), fever of unclear origin. Moved to the ICU 9/30 with shock. Suspect sepsis superimposed on hypovolemia, hemorrhagic components.  ASSESSMENT / PLAN:  PULMONARY A: No acute issues P:   pulm hygiene CXR now to eval for evolving infiltrates  CARDIOVASCULAR A:   Shock, components of hypovolemic, hemorrhagic. Suspect also septic shock based on fever and SIRS. Source unclear P:  Phenylephrine initiated 9/30 goal MAP > 65; place CVC if needs increase Continue IVF boluses Transfusion goal hemoglobin greater than 7.0 as below abx as below   RENAL A:   Non-AG metabolic acidosis P:   Follow BMP, UOP  GASTROINTESTINAL A:   Chronic diarrhea Protein calorie malnutrition SUP P:   Pantoprazole bid  HEMATOLOGIC A:   Severe anemia, likely malnutrition, Fe-deficiency, chronic disease. No evidence active blood loss P:  Transfuse for Hgb goal > 7.0 Serial CBC Note his discussion with Dr. Thereasa Solo regarding GI evaluation. Agree that his anemia is likely chronic. We will revisit possible GI consult as we go forward.  INFECTIOUS A:   Fever of unknown origin HIV with AIDS, no treatment since 2016 Thrush P:   Currently on tx for presumed MAI with 3 drug regimen : ethambutol, azithromycin and Levaquin.   Fevers persistent  Bcx negative  CXR now  Urine culture no growth  Follow procalcitonin  We will review current antibiotics with Dr Johnnye Sima, determine whether we need to  broaden. Currently on Levaquin plus empiric therapy for disseminated MAIC   Bactrim PCP prophylaxis Fluconazole for thrush Per ID, ART currently on hold while for ~ 2 weeks with CD4 count at 30.  ENDOCRINE A:   Possible adrenal insufficiency  P:   Random AM cortisol elevated to 25.4   NEUROLOGIC A:   No acute issues P:   RASS goal: 0  Follow     FAMILY  - Updates: Updated patient and father at bedside 9/30  - Inter-disciplinary family meet or Palliative Care meeting due by:  10/04/17   Lovenia Kim, MD  [facility] PGY-2   STAFF NOTE: I, Merrie Roof, MD FACP have personally reviewed patient's available data, including medical history, events of note, physical examination and test results as part of my evaluation. I have discussed with resident/NP and other  care providers such as pharmacist, RN and RRT. In addition, I personally evaluated patient and elicited key findings of: awake, alert, talking, jvd wnl, lungs cta anterior, abdo soft, BS wnl, no r/g, not tachy, remained on neo, to map goal 65, consider map 60, cortisol 25, would hold off stress roids, if drops BP as marginal for pt with AIDS, would start and florinef, allow pos balance to continued without edema on pcxr, I reviewed last CT abdo which did not show acute path, pcxr slight possible hilar infiltrate / hazziness not clear, repeat pcxr in am , MAP goal should be 60, biolus again if needed and consider NONINvasive monitoring, ABX per ID, consider dc vanc, dc levo per ID, will follow cbc in am and BM aspiration culture results, would NOT use pct in setting aids, dc further testing, reduce voluem in for now, I updated pt, we shold consider CT chest assessment to re assess slight hilum prominenece in this setting The patient is critically ill with multiple organ systems failure and requires high complexity decision making for assessment and support, frequent evaluation and titration of therapies, application of advanced monitoring technologies and extensive interpretation of multiple databases.   Critical Care Time devoted to patient care services described in this note is 30 Minutes. This time reflects time of care of this signee: Merrie Roof, MD FACP. This critical care time does not reflect procedure time, or teaching time or supervisory time of PA/NP/Med student/Med Resident etc but could involve care discussion time. Rest per NP/medical resident whose note is outlined above and that I agree with   Lavon Paganini. Titus Mould, MD, [place] Pgr: [facility] 09/29/2017 12:15 PM

## 2017-09-29 NOTE — Progress Notes (Signed)
[facility] for Infectious Disease  Date of Admission:  09/24/2017     Total days of antibiotics  5  Azithromycin 9/29 -   Ethambutol 9/29 -   Levofloxacin 9/26 -   Vancomycin 9/30 -   Bactrim SS OI Proph - 9/29 -          ASSESSMENT:  AIDS  - CD4 30 - VL 604,000 VDRF Hypotension Suspected MAI (wt loss, fever, anemia) Thrush Anemia Protein Calorie Malnutrition, severe  PLAN:  1. Currently on tx for presumed MAI with 3 drug regimen Ethambutol, Azithromycin, Levaquin.  2. Fevers persist although he is off Neo for hemodynamic support since adding back Vancomycin. Would continue this for now and observe fever curve. ROS and diagnostics neg for any intraabdominal or pulmonary process. BCx negative.  3. Awaiting cultures of bone marrow biopsy - will discuss with micro regarding sending out for AF smear/culture 4. ART currently on hold while for ~ 2 weeks with CD4 count at 30.  Janene Madeira, MSN, NP-C [facility] for Infectious Disease [facility] Pager: [phone]  09/29/2017  10:57 AM     Principal Problem:   Symptomatic anemia Active Problems:   Sepsis, unspecified organism ([place])   Hyponatremia   GI bleeding   AIDS (HCC)   Non-compliance   Protein-calorie malnutrition, severe   . fentaNYL      . lidocaine      . midazolam      . azithromycin  500 mg Oral Daily  . ethambutol  15 mg/kg Oral Daily  . feeding supplement  1 Container Oral TID BM  . fluconazole  100 mg Oral Daily  . pantoprazole  40 mg Oral BID  . sodium chloride flush  3 mL Intravenous Q12H  . sulfamethoxazole-trimethoprim  1 tablet Oral Daily    SUBJECTIVE: HPI:  Anthony Yang is a 31 y.o. 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 He has been prev prescribed atripla --> genvoya. Previous CD4 was 25 (01-2015).  He has been off meds and out of care for 2 years.   He comes to [facility] on 9-27 with 2 months  of fatigue, weakness, anorexia. He was found to have temp 103.1 and tachycardia (142). He had Na 121, alb 2.2 and was anemic (hgb 5.4). He was started on vancomycin, azactam and levaquin. His CXR was clear. He was guaic positive.   Still febrile up to 102.7 last PM, none since ~ MN with last dose tylenol for tx around that time. WBC normal. BM biopsy was performed today for anemia w/u.  He reports that overall he feels improved since coming to hospital for treatment.   Review of Systems: Review of Systems  All other systems reviewed and are negative.   Allergies  Allergen Reactions  . Penicillins Hives    OBJECTIVE: Vitals:   09/29/17 0940 09/29/17 0943 09/29/17 0945 09/29/17 0955  BP: 108/68  120/72 113/72  Pulse: (!) 106  (!) 112 (!) 111  Resp: (!) _0 Temp:      TempSrc:      SpO2: 100% 100% 100% 100%  Weight:      Height:       Body mass index is 22.88 kg/m.  Physical Exam  Constitutional: He is oriented to person, place, and time.  Thin appearing AA male lying in bed. Father is at the bedside.  Eyes: No scleral icterus.  Cardiovascular: Regular rhythm, S1 normal, S2 normal and intact distal pulses.  Tachycardia present.   No murmur heard. Pulmonary/Chest: Effort normal and breath sounds normal.  Abdominal: Soft. Bowel sounds are normal. He exhibits no distension. There is no tenderness.  Lymphadenopathy:    He has no cervical adenopathy.  Neurological: He is alert and oriented to person, place, and time.  Skin: Skin is warm and dry.  Psychiatric: Mood and affect normal.    Lab Results Lab Results  Component Value Date   WBC 5.4 09/29/2017   HGB 7.8 (L) 09/29/2017   HCT 23.9 (L) 09/29/2017   MCV 79.9 09/29/2017   PLT 91 (L) 09/29/2017    Lab Results  Component Value Date   CREATININE 0.83 09/29/2017   BUN 7 09/29/2017   NA 136 09/29/2017   K 3.7 09/29/2017   CL 113 (H) 09/29/2017   CO2 18 (L) 09/29/2017    Lab Results  Component Value Date    ALT 9 (L) 09/27/2017   AST 19 09/27/2017   ALKPHOS 50 09/27/2017   BILITOT 0.8 09/27/2017     Microbiology: Recent Results (from the past 240 hour(s))  Blood Culture (routine x 2)     Status: None (Preliminary result)   Collection Time: 09/25/17 12:06 AM  Result Value Ref Range Status   Specimen Description BLOOD BLOOD RIGHT FOREARM  Final   Special Requests   Final    BOTTLES DRAWN AEROBIC AND ANAEROBIC Blood Culture adequate volume   Culture NO GROWTH 3 DAYS  Final   Report Status PENDING  Incomplete  Blood Culture (routine x 2)     Status: None (Preliminary result)   Collection Time: 09/25/17 12:36 AM  Result Value Ref Range Status   Specimen Description BLOOD LEFT WRIST  Final   Special Requests   Final    BOTTLES DRAWN AEROBIC AND ANAEROBIC Blood Culture adequate volume   Culture NO GROWTH 3 DAYS  Final   Report Status PENDING  Incomplete  MRSA PCR Screening     Status: None   Collection Time: 09/25/17  6:50 AM  Result Value Ref Range Status   MRSA by PCR NEGATIVE NEGATIVE Final    Comment:        The GeneXpert MRSA Assay (FDA approved for NASAL specimens only), is one component of a comprehensive MRSA colonization surveillance program. It is not intended to diagnose MRSA infection nor to guide or monitor treatment for MRSA infections.   MRSA PCR Screening     Status: None   Collection Time: 09/28/17  8:07 AM  Result Value Ref Range Status   MRSA by PCR NEGATIVE NEGATIVE Final    Comment:        The GeneXpert MRSA Assay (FDA approved for NASAL specimens only), is one component of a comprehensive MRSA colonization surveillance program. It is not intended to diagnose MRSA infection nor to guide or monitor treatment for MRSA infections.   Urine Culture     Status: None   Collection Time: 09/28/17  9:39 AM  Result Value Ref Range Status   Specimen Description URINE, CLEAN CATCH  Final   Special Requests NONE  Final   Culture NO GROWTH  Final   Report  Status 09/29/2017 FINAL  Final

## 2017-09-29 NOTE — Progress Notes (Signed)
  Echocardiogram 2D Echocardiogram has been performed.  Anthony Yang F 09/29/2017, 2:39 PM

## 2017-09-29 NOTE — Procedures (Signed)
BM aspirate and core 11 g EBL 0 Comp 0

## 2017-09-30 ENCOUNTER — Inpatient Hospital Stay (HOSPITAL_COMMUNITY): Payer: Medicaid Other

## 2017-09-30 LAB — BASIC METABOLIC PANEL
ANION GAP: 4 — AB (ref 5–15)
BUN: 7 mg/dL (ref 6–20)
CHLORIDE: 110 mmol/L (ref 101–111)
CO2: 18 mmol/L — ABNORMAL LOW (ref 22–32)
Calcium: 6.8 mg/dL — ABNORMAL LOW (ref 8.9–10.3)
Creatinine, Ser: 0.72 mg/dL (ref 0.61–1.24)
GFR calc Af Amer: >60 mL/min (ref >60)
GFR calc non Af Amer: >60 mL/min (ref >60)
GLUCOSE: 99 mg/dL (ref 65–99)
POTASSIUM: 3.7 mmol/L (ref 3.5–5.1)
Sodium: 132 mmol/L — ABNORMAL LOW (ref 135–145)

## 2017-09-30 LAB — MAGNESIUM: Magnesium: 2.1 mg/dL (ref 1.7–2.4)

## 2017-09-30 LAB — CBC
HCT: 22 % — ABNORMAL LOW (ref 39.0–52.0)
HEMOGLOBIN: 7.2 g/dL — AB (ref 13.0–17.0)
MCH: 25.9 pg — AB (ref 26.0–34.0)
MCHC: 32.7 g/dL (ref 30.0–36.0)
MCV: 79.1 fL (ref 78.0–100.0)
Platelets: 74 10*3/uL — ABNORMAL LOW (ref 150–400)
RBC: 2.78 MIL/uL — AB (ref 4.22–5.81)
RDW: 19.6 % — ABNORMAL HIGH (ref 11.5–15.5)
WBC: 4.3 10*3/uL (ref 4.0–10.5)

## 2017-09-30 LAB — CULTURE, BLOOD (ROUTINE X 2)
CULTURE: NO GROWTH
Culture: NO GROWTH
SPECIAL REQUESTS: ADEQUATE
Special Requests: ADEQUATE

## 2017-09-30 LAB — ACID FAST SMEAR (AFB, MYCOBACTERIA): Acid Fast Smear: NEGATIVE

## 2017-09-30 LAB — PHOSPHORUS: PHOSPHORUS: 2.8 mg/dL (ref 2.5–4.6)

## 2017-09-30 LAB — ACID FAST SMEAR (AFB)

## 2017-09-30 MED ORDER — METRONIDAZOLE 500 MG PO TABS
250.0000 mg | ORAL_TABLET | Freq: Three times a day (TID) | ORAL | Status: DC
  Administered 2017-09-30 – 2017-10-02 (×7): 250 mg via ORAL
  Filled 2017-09-30 (×7): qty 1

## 2017-09-30 MED ORDER — ABACAVIR-DOLUTEGRAVIR-LAMIVUD 600-50-300 MG PO TABS
1.0000 | ORAL_TABLET | Freq: Every day | ORAL | Status: DC
  Administered 2017-10-01 – 2017-10-02 (×2): 1 via ORAL
  Filled 2017-09-30 (×2): qty 1

## 2017-09-30 NOTE — Care Management Note (Signed)
Case Management Note  Patient Details  Name: Anthony Yang MRN: [id number] Date of Birth: [birth year]  Subjective/Objective:                    Action/Plan: Plan is for home when patient is medicallly ready. CM has already arranged [facility]/u appointment with [facility] and gone over the use of the [facility] pharmacy. CM following.  Expected Discharge Date:                  Expected Discharge Plan:  Home/Self Care  In-House Referral:     Discharge planning Services  CM Consult, Indigent Health Clinic, Follow-up appt scheduled, Medication Assistance  Post Acute Care Choice:    Choice offered to:     DME Arranged:    DME Agency:     HH Arranged:    HH Agency:     Status of Service:  Completed, signed off  If discussed at [employer], dates discussed:    Additional Comments:  Kermit Balo, RN 09/30/2017, 11:28 AM

## 2017-09-30 NOTE — Progress Notes (Signed)
[facility] for Infectious Disease  Date of Admission:  09/24/2017     Total days of antibiotics  6  Azithromycin 9/29 -   Ethambutol 9/29 -   Levofloxacin 9/26 -   Vancomycin 9/30 -   Bactrim SS OI Proph - 9/29 -          ASSESSMENT:  AIDS  - CD4 30 - VL 604,000  VDRF - resolved   Hypotension - resolved   Suspected MAI (wt loss, fever, anemia)  Thrush  Anemia  Protein Calorie Malnutrition, severe  PLAN:  1. BM biopsy negative for AFB on smear. No growth on conventional culture. Will stop ethambutol and azithromycin.  2. GI pathogen + for Giardia - cause of fevers? Tx with Flagyl 263m TID x 7 days 3. Working to get him manufacturer assistance for ART. Unable to do Biktarvy for quick med access as he used his lifetime assistance when he was first diagnosed. HLA negative per [facility]where 2014 - will start Triumeq and get same-day medication access for him for simplicity of STR, tolerability and better barrier to resistance vs. Genvoya he was previously on. Will start ART back tomorrow morning since MAC work up negative.  4. Emergency ADAP application in process ([facility] 5. Await pathology report for biopsy and continue to follow cultures and fever curve.   SJanene Madeira MSN, NP-C [facility] Pager: [phone] 09/30/2017  4:47 PM     Principal Problem:   Symptomatic anemia Active Problems:   Sepsis, unspecified organism ([place]   Hyponatremia   GI bleeding   AIDS (acquired immune deficiency syndrome) ([place]   Non-compliance   Protein-calorie malnutrition, severe   Dyspnea   Thrush   . azithromycin  500 mg Oral Daily  . ethambutol  1,000 mg Oral Daily  . feeding supplement  1 Container Oral TID BM  . fluconazole  100 mg Oral Daily  . metroNIDAZOLE  250 mg Oral Q8H  . pantoprazole  40 mg Oral BID  . sodium chloride flush  3 mL Intravenous Q12H   . sulfamethoxazole-trimethoprim  1 tablet Oral Daily    SUBJECTIVE: HPI:  Anthony WEBBis a 31y.o. male with hx of HIV+since 2013 when [facility] was [facility] He has been prev prescribed atripla --> genvoya. Previous CD4 was 25 (01-2015).  He has been off meds and out of care for 2 years.   [facility], weakness, anorexia. He was found to have temp 103.1 and tachycardia (142). He had Na 121, alb 2.2 and was anemic (hgb 5.4). He was started on vancomycin, azactam and levaquin. His CXR was clear. He was guaic positive. Treatment started for presumed MAI.   TMax overnight was 100.1. No abdominal pain currently and feels improved overall. Tolerating his diet well and up moving around more. Moved to regular bed from ICU.   Review of Systems: Review of Systems  All other systems reviewed and are negative.   Allergies  Allergen Reactions  . Penicillins Hives    OBJECTIVE: Vitals:   09/30/17 0134 09/30/17 0400 09/30/17 1042 09/30/17 1309  BP: (!) 94/50 (!) 102/48 (!) 106/48 (!) 100/57  Pulse: (!) 110 96 99 97  Resp: _0 Temp: 99 F (37.2 C) 98.9 F (37.2 C) (!) 97.5 F (  36.4 C) 98.3 F (36.8 C)  TempSrc: Oral Oral Oral Oral  SpO2: 100% 100% 100% 100%  Weight:      Height:       Body mass index is 22.88 kg/m.  Physical Exam  Constitutional: He is oriented to person, place, and time.  Thin appearing AA male sitting in recliner. Appears as if he is feeling better today.   HENT:  Mouth/Throat: Oropharyngeal exudate present.  Eyes: No scleral icterus.  Cardiovascular: Normal rate, regular rhythm, S1 normal, S2 normal and intact distal pulses.   No murmur heard. Pulmonary/Chest: Effort normal and breath sounds normal.  Abdominal: Soft. Bowel sounds are normal. He exhibits no distension. There is no tenderness.  Lymphadenopathy:    He has no cervical adenopathy.  Neurological: He is alert  and oriented to person, place, and time.  Skin: Skin is warm and dry.  Psychiatric: Mood and affect normal.    Lab Results Lab Results  Component Value Date   WBC 4.3 09/30/2017   HGB 7.2 (L) 09/30/2017   HCT 22.0 (L) 09/30/2017   MCV 79.1 09/30/2017   PLT 74 (L) 09/30/2017    Lab Results  Component Value Date   CREATININE 0.72 09/30/2017   BUN 7 09/30/2017   NA 132 (L) 09/30/2017   K 3.7 09/30/2017   CL 110 09/30/2017   CO2 18 (L) 09/30/2017    Lab Results  Component Value Date   ALT 9 (L) 09/27/2017   AST 19 09/27/2017   ALKPHOS 50 09/27/2017   BILITOT 0.8 09/27/2017     Microbiology: Recent Results (from the past 240 hour(s))  Blood Culture (routine x 2)     Status: None   Collection Time: 09/25/17 12:06 AM  Result Value Ref Range Status   Specimen Description BLOOD BLOOD RIGHT FOREARM  Final   Special Requests   Final    BOTTLES DRAWN AEROBIC AND ANAEROBIC Blood Culture adequate volume   Culture NO GROWTH 5 DAYS  Final   Report Status 09/30/2017 FINAL  Final  Blood Culture (routine x 2)     Status: None   Collection Time: 09/25/17 12:36 AM  Result Value Ref Range Status   Specimen Description BLOOD LEFT WRIST  Final   Special Requests   Final    BOTTLES DRAWN AEROBIC AND ANAEROBIC Blood Culture adequate volume   Culture NO GROWTH 5 DAYS  Final   Report Status 09/30/2017 FINAL  Final  MRSA PCR Screening     Status: None   Collection Time: 09/25/17  6:50 AM  Result Value Ref Range Status   MRSA by PCR NEGATIVE NEGATIVE Final    Comment:        The GeneXpert MRSA Assay (FDA approved for NASAL specimens only), is one component of a comprehensive MRSA colonization surveillance program. It is not intended to diagnose MRSA infection nor to guide or monitor treatment for MRSA infections.   MRSA PCR Screening     Status: None   Collection Time: 09/28/17  8:07 AM  Result Value Ref Range Status   MRSA by PCR NEGATIVE NEGATIVE Final    Comment:        The  GeneXpert MRSA Assay (FDA approved for NASAL specimens only), is one component of a comprehensive MRSA colonization surveillance program. It is not intended to diagnose MRSA infection nor to guide or monitor treatment for MRSA infections.   Urine Culture     Status: None   Collection Time: 09/28/17  9:39 AM  Result Value Ref Range Status   Specimen Description URINE, CLEAN CATCH  Final   Special Requests NONE  Final   Culture NO GROWTH  Final   Report Status 09/29/2017 FINAL  Final  Stool culture (children & immunocomp patients)     Status: None (Preliminary result)   Collection Time: 09/28/17 12:54 PM  Result Value Ref Range Status   Salmonella/Shigella Screen Final report  Final   Campylobacter Culture PENDING  Incomplete   E coli, Shiga toxin Assay Negative Negative Final    Comment: (NOTE) Performed At: [facility] [place], [place] [id number] Lindon Romp MD FW:[id number]   Gastrointestinal Panel by PCR , Stool     Status: Abnormal   Collection Time: 09/28/17 12:54 PM  Result Value Ref Range Status   Campylobacter species NOT DETECTED NOT DETECTED Final   Plesimonas shigelloides NOT DETECTED NOT DETECTED Final   Salmonella species NOT DETECTED NOT DETECTED Final   Yersinia enterocolitica NOT DETECTED NOT DETECTED Final   Vibrio species NOT DETECTED NOT DETECTED Final   Vibrio cholerae NOT DETECTED NOT DETECTED Final   Enteroaggregative E coli (EAEC) NOT DETECTED NOT DETECTED Final   Enteropathogenic E coli (EPEC) NOT DETECTED NOT DETECTED Final   Enterotoxigenic E coli (ETEC) NOT DETECTED NOT DETECTED Final   Shiga like toxin producing E coli (STEC) NOT DETECTED NOT DETECTED Final   Shigella/Enteroinvasive E coli (EIEC) NOT DETECTED NOT DETECTED Final   Cryptosporidium NOT DETECTED NOT DETECTED Final   Cyclospora cayetanensis NOT DETECTED NOT DETECTED Final   Entamoeba histolytica NOT DETECTED NOT DETECTED Final   Giardia lamblia DETECTED  (A) NOT DETECTED Final   Adenovirus F40/41 NOT DETECTED NOT DETECTED Final   Astrovirus NOT DETECTED NOT DETECTED Final   Norovirus GI/GII NOT DETECTED NOT DETECTED Final   Rotavirus A NOT DETECTED NOT DETECTED Final   Sapovirus (I, II, IV, and V) NOT DETECTED NOT DETECTED Final  STOOL CULTURE REFLEX - RSASHR     Status: None   Collection Time: 09/28/17 12:54 PM  Result Value Ref Range Status   Stool Culture result 1 (RSASHR) Comment  Final    Comment: (NOTE) No Salmonella or Shigella recovered. Performed At: [facility] [place], [place] [id number] Lindon Romp MD MV:[id number]   Aerobic/Anaerobic Culture (surgical/deep wound)     Status: None (Preliminary result)   Collection Time: 09/29/17 10:19 AM  Result Value Ref Range Status   Specimen Description BONE MARROW  Final   Special Requests NONE  Final   Gram Stain   Final    ABUNDANT WBC PRESENT, PREDOMINANTLY MONONUCLEAR NO ORGANISMS SEEN    Culture NO GROWTH 1 DAY  Final   Report Status PENDING  Incomplete  Acid Fast Smear (AFB)     Status: None   Collection Time: 09/29/17 10:19 AM  Result Value Ref Range Status   AFB Specimen Processing Comment  Final    Comment: Tissue Grinding and Digestion/Decontamination   Acid Fast Smear Negative  Final    Comment: (NOTE) Performed At: [facility] [place], [place] [id number] Lindon Romp MD ML:[id number]    Source (AFB) BONE MARROW  Final    Comment: Performed at [facility], [place] [address], [place], [place] [address]

## 2017-09-30 NOTE — Progress Notes (Signed)
Patient arrived from 6M to 4C18. Patient alert and oriented x 4. Patient's father at bedside. Assist x 1 from wheelchair to bed. Vital signs taken. Temp 99.0 and BP 94/50. Patient reports no pain. Patient and father oriented to room, phone and call bell. Will continue to monitor.

## 2017-09-30 NOTE — Progress Notes (Addendum)
[facility] TEAM 1 - Stepdown/ICU TEAM  Anthony Yang  NFA:[id number] DOB: 10/07/1986 DOA: 09/24/2017 PCP: Patient, No Pcp Per    Brief Narrative:  31yo M w/ a Hx of HIV/AIDS who was lost to follow-up ~2 years ago who presented w/ 2 months of fatigue, generalized weakness, loss of appetite, and malaise w/ weight loss.  He reported that he is in the process of transferring his HIV care to [facility], but acknowledged that he has not taken any medications for this since early 2016.  In the ED the patient was found to be febrile to 39.5 C and tachycardic in the 130s.  Chest x-ray was negative for acute cardiopulmonary disease. Chemistry panel revealed a sodium of 121 and albumin of 2.2. CBC was notable for hemoglobin of 5.4.   Subjective: The pt has no complaints, except that I am interrupting a planned nap.  He denies sob, cp, or abdom pain.  He does not appear to be in any acute distress.    Assessment & Plan:  Sepsis versus SIRS - FUO UA unrevealing - blood cx no growth thus far - no focal infiltrate on CXR or CT chest - abx per ID service - being tx empirically for possible M avium - bone marrow bx is pending - Crypto Ag negative - abx as per ID   AIDS off tx since early 2016 - CD4 is 12 - HIV viral load 600K - of note HIS FAMILY IS NOT AWARE OF THIS DIAGNOSIS - ID service directing care - has not yet been started on viral tx   Severe symptomatic anemia of unclear etiology Stool was guiac + but this was w/ a small amount of red blood at the time of a rectal exam w/ brown stool otherwise - the pt has not experienced any BRBPR otherwise, and denies melena - I do not suspect significant GI blood loss - has been transfused 3U PRBC thus far - Fe studies most c/w poor nutritional status - Hgb was 14.8 in 2016 - perhaps this is directly related to HIV itself - bone marrow studies pending   Recent Labs Lab 09/28/17 0947 09/28/17 1514 09/28/17 2134 09/29/17 0406 09/30/17 0247  HGB 7.6* 8.0* 7.9*  7.8* 7.2*    Hyponatremia Likely simply due to hypovolemia - low urine Na c/w this - has nearly resolved w/ volume expansion - follow trend   Severe malnutrition in context of chronic illness Likely AIDS waisting - presently has a great appetite w/ good intake   Giardia PCR screen + Begin flagyl as per ID recs  DVT prophylaxis: SCDs Code Status: FULL CODE Family Communication: no family present at time of exam  Disposition Plan: stable on tele bed   Consultants:  ID  Procedures: none  Antimicrobials:  Anti-infectives    Start     Dose/Rate Route Frequency Ordered Stop   09/30/17 1000  ethambutol (MYAMBUTOL) tablet 1,000 mg     1,000 mg Oral Daily 09/29/17 1423     09/28/17 1200  vancomycin (VANCOCIN) IVPB 750 mg/150 ml premix  Status:  Discontinued     750 mg 150 mL/hr over 60 Minutes Intravenous Every 8 hours 09/28/17 1044 09/29/17 1213   09/28/17 0800  elvitegravir-cobicistat-emtricitabine-tenofovir (GENVOYA) 150-150-200-10 MG tablet 1 tablet  Status:  Discontinued     1 tablet Oral Daily with breakfast 09/27/17 1615 09/28/17 1035   09/27/17 1630  fluconazole (DIFLUCAN) tablet 100 mg     100 mg Oral Daily 09/27/17 1615 10/04/17 0959  09/27/17 1630  sulfamethoxazole-trimethoprim (BACTRIM,SEPTRA) 400-80 MG per tablet 1 tablet     1 tablet Oral Daily 09/27/17 1615     09/27/17 1630  ethambutol (MYAMBUTOL) tablet 950 mg  Status:  Discontinued     15 mg/kg  61.9 kg Oral Daily 09/27/17 1615 09/29/17 1423   09/27/17 1630  azithromycin (ZITHROMAX) tablet 500 mg     500 mg Oral Daily 09/27/17 1615     09/26/17 1700  vancomycin (VANCOCIN) IVPB 750 mg/150 ml premix  Status:  Discontinued     750 mg 150 mL/hr over 60 Minutes Intravenous Every 8 hours 09/26/17 1137 09/26/17 1824   09/25/17 2200  levofloxacin (LEVAQUIN) IVPB 750 mg  Status:  Discontinued     750 mg 100 mL/hr over 90 Minutes Intravenous Every 24 hours 09/25/17 0136 09/29/17 1213   09/25/17 0830  vancomycin  (VANCOCIN) 500 mg in sodium chloride 0.9 % 100 mL IVPB  Status:  Discontinued     500 mg 100 mL/hr over 60 Minutes Intravenous Every 8 hours 09/25/17 0800 09/26/17 1137   09/25/17 0600  vancomycin (VANCOCIN) 500 mg in sodium chloride 0.9 % 100 mL IVPB  Status:  Discontinued     500 mg 100 mL/hr over 60 Minutes Intravenous Every 8 hours 09/25/17 0136 09/25/17 0800   09/25/17 0600  aztreonam (AZACTAM) 1 g in dextrose 5 % 50 mL IVPB  Status:  Discontinued     1 g 100 mL/hr over 30 Minutes Intravenous Every 8 hours 09/25/17 0136 09/27/17 1615   09/24/17 2345  levofloxacin (LEVAQUIN) IVPB 750 mg     750 mg 100 mL/hr over 90 Minutes Intravenous  Once 09/24/17 2335 09/25/17 0219   09/24/17 2345  aztreonam (AZACTAM) 2 g in dextrose 5 % 50 mL IVPB     2 g 100 mL/hr over 30 Minutes Intravenous  Once 09/24/17 2335 09/25/17 0131   09/24/17 2345  vancomycin (VANCOCIN) IVPB 1000 mg/200 mL premix     1,000 mg 200 mL/hr over 60 Minutes Intravenous  Once 09/24/17 2335 09/25/17 0219      Objective: Blood pressure (!) 100/57, pulse 97, temperature 98.3 F (36.8 C), temperature source Oral, resp. rate 18, height  (1.676 m), weight 64.3 kg (141 lb 12.1 oz), SpO2 100 %.  Intake/Output Summary (Last 24 hours) at 09/30/17 1519 Last data filed at 09/30/17 0000  Gross per 24 hour  Intake              450 ml  Output              200 ml  Net              250 ml   Filed Weights   09/26/17 0427 09/26/17 2158 09/28/17 0754  Weight: 54.7 kg (120 lb 9.5 oz) 61.9 kg (136 lb 6.4 oz) 64.3 kg (141 lb 12.1 oz)    Examination: General: No acute respiratory distress - alert and calm Lungs: CTA B - no wheeze o Cardiovascular: RRR w/o M Abdomen: NT/ND, soft, bowel sounds positive, no rebound - very thin  Extremities: no signif edema B LE    CBC:  Recent Labs Lab 09/24/17 2227 09/25/17 1101  09/28/17 0947 09/28/17 1514 09/28/17 2134 09/29/17 0406 09/30/17 0247  WBC 6.6 4.2  < > 7.6 7.3 5.3 5.4 4.3    NEUTROABS 4.3 3.1  --   --   --   --   --   --   HGB 5.4* 7.6*  < >  7.6* 8.0* 7.9* 7.8* 7.2*  HCT 17.1* 23.5*  < > 23.6* 24.4* 24.0* 23.9* 22.0*  MCV 79.2 78.6  < > 79.7 80.5 79.7 79.9 79.1  PLT 180 152  < > 119* 109* 98* 91* 74*  < > = values in this interval not displayed. Basic Metabolic Panel:  Recent Labs Lab 09/25/17 0235 09/27/17 0418 09/28/17 0311 09/29/17 0406 09/30/17 0247  NA 125* 132* 133* 136 132*  K 4.0 3.5 3.8 3.7 3.7  CL 97* 105 109 113* 110  CO2 19* 22 19* 18* 18*  GLUCOSE 134* 111* 97 97 99  BUN <5* <5* <5* 7 7  CREATININE 0.77 0.71 1.00 0.83 0.72  CALCIUM 6.9* 6.9* 6.9* 6.6* 6.8*  MG  --   --   --  1.4* 2.1  PHOS  --   --   --   --  2.8   GFR: Estimated Creatinine Clearance: 120.7 mL/min (by C-G formula based on SCr of 0.72 mg/dL).  Liver Function Tests:  Recent Labs Lab 09/24/17 2227 09/27/17 0418  AST 40 19  ALT 12* 9*  ALKPHOS 66 50  BILITOT 1.3* 0.8  PROT 7.2 5.2*  ALBUMIN 2.2* 1.5*    Coagulation Profile:  Recent Labs Lab 09/25/17 0006  INR 1.28    CBG:  Recent Labs Lab 09/25/17 1215 09/25/17 1940 09/25/17 2321 09/26/17 0353 09/28/17 0851  GLUCAP 87 90 110* 103* 83    Recent Results (from the past 240 hour(s))  Blood Culture (routine x 2)     Status: None   Collection Time: 09/25/17 12:06 AM  Result Value Ref Range Status   Specimen Description BLOOD BLOOD RIGHT FOREARM  Final   Special Requests   Final    BOTTLES DRAWN AEROBIC AND ANAEROBIC Blood Culture adequate volume   Culture NO GROWTH 5 DAYS  Final   Report Status 09/30/2017 FINAL  Final  Blood Culture (routine x 2)     Status: None   Collection Time: 09/25/17 12:36 AM  Result Value Ref Range Status   Specimen Description BLOOD LEFT WRIST  Final   Special Requests   Final    BOTTLES DRAWN AEROBIC AND ANAEROBIC Blood Culture adequate volume   Culture NO GROWTH 5 DAYS  Final   Report Status 09/30/2017 FINAL  Final  MRSA PCR Screening     Status: None    Collection Time: 09/25/17  6:50 AM  Result Value Ref Range Status   MRSA by PCR NEGATIVE NEGATIVE Final    Comment:        The GeneXpert MRSA Assay (FDA approved for NASAL specimens only), is one component of a comprehensive MRSA colonization surveillance program. It is not intended to diagnose MRSA infection nor to guide or monitor treatment for MRSA infections.   MRSA PCR Screening     Status: None   Collection Time: 09/28/17  8:07 AM  Result Value Ref Range Status   MRSA by PCR NEGATIVE NEGATIVE Final    Comment:        The GeneXpert MRSA Assay (FDA approved for NASAL specimens only), is one component of a comprehensive MRSA colonization surveillance program. It is not intended to diagnose MRSA infection nor to guide or monitor treatment for MRSA infections.   Urine Culture     Status: None   Collection Time: 09/28/17  9:39 AM  Result Value Ref Range Status   Specimen Description URINE, CLEAN CATCH  Final   Special Requests NONE  Final   Culture  NO GROWTH  Final   Report Status 09/29/2017 FINAL  Final  Stool culture (children & immunocomp patients)     Status: None (Preliminary result)   Collection Time: 09/28/17 12:54 PM  Result Value Ref Range Status   Salmonella/Shigella Screen Final report  Final   Campylobacter Culture PENDING  Incomplete   E coli, Shiga toxin Assay Negative Negative Final    Comment: (NOTE) Performed At: [facility] [address] [place], [place] [id number] Mila Homer MD NW:[id number]   Gastrointestinal Panel by PCR , Stool     Status: Abnormal   Collection Time: 09/28/17 12:54 PM  Result Value Ref Range Status   Campylobacter species NOT DETECTED NOT DETECTED Final   Plesimonas shigelloides NOT DETECTED NOT DETECTED Final   Salmonella species NOT DETECTED NOT DETECTED Final   Yersinia enterocolitica NOT DETECTED NOT DETECTED Final   Vibrio species NOT DETECTED NOT DETECTED Final   Vibrio cholerae NOT DETECTED NOT  DETECTED Final   Enteroaggregative E coli (EAEC) NOT DETECTED NOT DETECTED Final   Enteropathogenic E coli (EPEC) NOT DETECTED NOT DETECTED Final   Enterotoxigenic E coli (ETEC) NOT DETECTED NOT DETECTED Final   Shiga like toxin producing E coli (STEC) NOT DETECTED NOT DETECTED Final   Shigella/Enteroinvasive E coli (EIEC) NOT DETECTED NOT DETECTED Final   Cryptosporidium NOT DETECTED NOT DETECTED Final   Cyclospora cayetanensis NOT DETECTED NOT DETECTED Final   Entamoeba histolytica NOT DETECTED NOT DETECTED Final   Giardia lamblia DETECTED (A) NOT DETECTED Final   Adenovirus F40/41 NOT DETECTED NOT DETECTED Final   Astrovirus NOT DETECTED NOT DETECTED Final   Norovirus GI/GII NOT DETECTED NOT DETECTED Final   Rotavirus A NOT DETECTED NOT DETECTED Final   Sapovirus (I, II, IV, and V) NOT DETECTED NOT DETECTED Final  STOOL CULTURE REFLEX - RSASHR     Status: None   Collection Time: 09/28/17 12:54 PM  Result Value Ref Range Status   Stool Culture result 1 (RSASHR) Comment  Final    Comment: (NOTE) No Salmonella or Shigella recovered. Performed At: [facility] [address] [place], [place] [id number] Mila Homer MD BM:[id number]   Aerobic/Anaerobic Culture (surgical/deep wound)     Status: None (Preliminary result)   Collection Time: 09/29/17 10:19 AM  Result Value Ref Range Status   Specimen Description BONE MARROW  Final   Special Requests NONE  Final   Gram Stain   Final    ABUNDANT WBC PRESENT, PREDOMINANTLY MONONUCLEAR NO ORGANISMS SEEN    Culture NO GROWTH 1 DAY  Final   Report Status PENDING  Incomplete  Acid Fast Smear (AFB)     Status: None   Collection Time: 09/29/17 10:19 AM  Result Value Ref Range Status   AFB Specimen Processing Comment  Final    Comment: Tissue Grinding and Digestion/Decontamination   Acid Fast Smear Negative  Final    Comment: (NOTE) Performed At: [facility] [address] [place], [place] [id number] Mila Homer  MD IH:[id number]    Source (AFB) BONE MARROW  Final    Comment: Performed at [facility], [address], [place], [place] [address]     Scheduled Meds: . azithromycin  500 mg Oral Daily  . ethambutol  1,000 mg Oral Daily  . feeding supplement  1 Container Oral TID BM  . fluconazole  100 mg Oral Daily  . pantoprazole  40 mg Oral BID  . sodium chloride flush  3 mL Intravenous Q12H  .  sulfamethoxazole-trimethoprim  1 tablet Oral Daily     LOS: 5 days   Lonia Blood, MD [facility] Office  [phone] Pager - Text Page per Amion as per below:  On-Call/Text Page:      [email or web address]      password [email or web address]  If 7PM-7AM, please contact night-coverage [email or web address] Password [email or web address] 09/30/2017, 3:19 PM

## 2017-09-30 NOTE — Progress Notes (Signed)
Noted non-pitting edema to bilaterally feet.  Patient has been sitting up in the chair and walking today. Encouraged patient to decrease salt in diet, drink water instead of soda, and rest with feet elevated. Will continue to monitor. Lawson Radar

## 2017-10-01 ENCOUNTER — Other Ambulatory Visit: Payer: Self-pay | Admitting: Pharmacist

## 2017-10-01 DIAGNOSIS — R0600 Dyspnea, unspecified: Secondary | ICD-10-CM

## 2017-10-01 DIAGNOSIS — J189 Pneumonia, unspecified organism: Secondary | ICD-10-CM

## 2017-10-01 DIAGNOSIS — A071 Giardiasis [lambliasis]: Secondary | ICD-10-CM

## 2017-10-01 DIAGNOSIS — B370 Candidal stomatitis: Secondary | ICD-10-CM

## 2017-10-01 LAB — CBC
HCT: 19.6 % — ABNORMAL LOW (ref 39.0–52.0)
HEMOGLOBIN: 6.4 g/dL — AB (ref 13.0–17.0)
MCH: 26.1 pg (ref 26.0–34.0)
MCHC: 32.7 g/dL (ref 30.0–36.0)
MCV: 80 fL (ref 78.0–100.0)
Platelets: 75 10*3/uL — ABNORMAL LOW (ref 150–400)
RBC: 2.45 MIL/uL — AB (ref 4.22–5.81)
RDW: 19.8 % — ABNORMAL HIGH (ref 11.5–15.5)
WBC: 3 10*3/uL — ABNORMAL LOW (ref 4.0–10.5)

## 2017-10-01 LAB — STOOL CULTURE: E COLI SHIGA TOXIN ASSAY: NEGATIVE

## 2017-10-01 LAB — PREPARE RBC (CROSSMATCH)

## 2017-10-01 LAB — STOOL CULTURE REFLEX - CMPCXR

## 2017-10-01 LAB — LACTATE DEHYDROGENASE: LDH: 212 U/L — ABNORMAL HIGH (ref 98–192)

## 2017-10-01 LAB — DIRECT ANTIGLOBULIN TEST (NOT AT ARMC)
DAT, IgG: NEGATIVE
DAT, complement: NEGATIVE

## 2017-10-01 LAB — STOOL CULTURE REFLEX - RSASHR

## 2017-10-01 LAB — HLA B*5701: HLA B 5701: NEGATIVE

## 2017-10-01 MED ORDER — SIMETHICONE 80 MG PO CHEW
80.0000 mg | CHEWABLE_TABLET | Freq: Three times a day (TID) | ORAL | Status: DC | PRN
  Administered 2017-10-01: 80 mg via ORAL
  Filled 2017-10-01: qty 1

## 2017-10-01 MED ORDER — SODIUM CHLORIDE 0.9 % IV SOLN: Freq: Once | INTRAVENOUS | Status: DC

## 2017-10-01 MED FILL — TRIUMEQ 600-50-300 MG TABS: 600-50-300 | 30 days supply | Qty: 30 | Fill #0

## 2017-10-01 MED FILL — SULFAMETHOXAZOLE-TMP SS TAB: 400-80 | 30 days supply | Qty: 30 | Fill #0

## 2017-10-01 MED FILL — FLUCONAZOLE 100 MG TABLET: 100 | 3 days supply | Qty: 3 | Fill #0

## 2017-10-01 MED FILL — metroNIDAZOLE 250 MG TABS: 250 | 6 days supply | Qty: 18 | Fill #0

## 2017-10-01 NOTE — Discharge Instructions (Signed)

## 2017-10-01 NOTE — Progress Notes (Addendum)
CRITICAL VALUE ALERT  Critical Value:  Hemoglobin 6.4  Date & Time Notied:  10/01/17 at 0400  Provider Notified: Dr. Toniann Fail   Orders Received/Actions taken: 1 unit RBC's

## 2017-10-01 NOTE — Progress Notes (Addendum)
Nutrition Follow-up  DOCUMENTATION CODES:   Severe malnutrition in context of chronic illness  INTERVENTION:   Boost Breeze po TID, each supplement provides 250 kcal and 9 grams of protein  Magic cup TID with meals, each supplement provides 290 kcal and 9 grams of protein   NUTRITION DIAGNOSIS:   Malnutrition (severe) related to catabolic illness (HIV with medication noncompliance) as evidenced by severe depletion of body fat, severe depletion of muscle mass.  Ongoing  GOAL:   Patient will meet greater than or equal to 90% of their needs  Not meeting  MONITOR:   PO intake, Supplement acceptance, Labs, Weight trends, Skin, I & O's  REASON FOR ASSESSMENT:   Malnutrition Screening Tool    ASSESSMENT:   31 y.o. Male with medical history significant for HIV/AIDS, lost to follow-up within 2 years ago, now presenting to the emergency department with 2 months of fatigue, generalized weakness, loss of appetite, and malaise.   No meal completions documented on this pt. Pt upgraded to regular diet 10/1. Pt reports eating off and on typically 25-50% of his meal. Not consuming Boost at this time.  Discussed protein options post discharge to protect lean body mass and aid in wt gain.  Wt up 5 lb since last RD visit.   Medications reviewed and include: NS @ 50 ml/hr Labs reviewed: Na 132 (L)   Diet Order:  Diet regular Room service appropriate? Yes; Fluid consistency: Thin  Skin:  Wound (see comment) (small 1 mm open area to sacrum)  Last BM:  10/01/17  Height:   Ht Readings from Last 1 Encounters:  09/28/17  (1.676 m)    Weight:   Wt Readings from Last 1 Encounters:  09/28/17 141 lb 12.1 oz (64.3 kg)    Ideal Body Weight:  64.5 kg  BMI:  Body mass index is 22.88 kg/m.  Estimated Nutritional Needs:   Kcal:  1900-2100  Protein:  95-110 gm  Fluid:  1.9-2.1 L  EDUCATION NEEDS:   No education needs identified at this time  Vanessa Kick RD,  LDN Clinical Nutrition Pager # - [phone]

## 2017-10-01 NOTE — Progress Notes (Signed)
PROGRESS NOTE    Anthony Yang  ZYY:[id number] DOB: 1986-08-24 DOA: 09/24/2017 PCP: Patient, No Pcp Per   Chief Complaint  Patient presents with  . Weigh Loss  . 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 -Infectious disease consulted and appreciated  -CD4  12, HIV viral laod 600K -recommended finishing 7 day course of Flagyl, Bactrim DS daily for OI prophylaxis, continue fluconazole  -Started Triumeq today  Severe symptomatic anemia- Acute on chronic  -unclear etiology -hemoglobin was 5.4 on admission  -FOBT +, followed by negative twice, however possibly hemorrhoidal   -patient denies dark tarry stool or BRBPR- doubt GI source -Anemia thought to be acute on chronic given iron studies  -Interventional radiology consulted and appreciated for bone marrow biopsy- done on 09/29/2017 -Discussed with hematology, Anthony Yang, recommended hemolysis panel, but likely anemia is related to HIV/AIDS, would follow up on bone marrow biopsy -patient has received 3u PRBCs, hemoglobin currently 6.4 -will transfuse another 2uPRBCs -Continue to monitor CBC  Hyponatremia -possibly due to hypovolemia, low urine sodium  -sodium was 121 on admission, up to 132 on 09/30/2017 -Continue to monitor BMP  Severe malnutrition  -in the context of chronic illness -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 Possible discharge to home on 10/4  Consultants Infectious disease Interventional radiology [facility] Hematology/oncology, via phone, Anthony Yang  Procedures  Bone marrow biopsy   Antibiotics   Anti-infectives    Start     Dose/Rate Route Frequency Ordered Stop   10/01/17 1000  abacavir-dolutegravir-lamiVUDine (TRIUMEQ) 600-50-300 MG per tablet 1 tablet     1 tablet Oral Daily 09/30/17 1703     09/30/17 1800  metroNIDAZOLE (FLAGYL) tablet 250 mg     250 mg Oral Every 8 hours 09/30/17 1541 10/07/17 2159   09/30/17 1000  ethambutol (MYAMBUTOL) tablet 1,000 mg  Status:  Discontinued     1,000 mg Oral Daily 09/29/17 1423 09/30/17 1701   09/28/17 1200  vancomycin (VANCOCIN) IVPB 750 mg/150 ml premix  Status:  Discontinued     750 mg 150 mL/hr over 60 Minutes Intravenous Every 8 hours 09/28/17 1044 09/29/17 1213   09/28/17 0800  elvitegravir-cobicistat-emtricitabine-tenofovir (GENVOYA) 150-150-200-10 MG tablet 1 tablet  Status:  Discontinued     1 tablet Oral Daily with breakfast 09/27/17 1615 09/28/17 1035   09/27/17 1630  fluconazole (DIFLUCAN) tablet 100 mg     100 mg Oral Daily 09/27/17 1615 10/04/17 0959   09/27/17 1630  sulfamethoxazole-trimethoprim (BACTRIM,SEPTRA) 400-80 MG per tablet 1 tablet     1 tablet Oral Daily 09/27/17 1615     09/27/17 1630  ethambutol (MYAMBUTOL) tablet 950 mg  Status:  Discontinued     15 mg/kg  61.9 kg Oral Daily 09/27/17 1615 09/29/17 1423   09/27/17 1630  azithromycin (ZITHROMAX) tablet 500 mg  Status:  Discontinued  500 mg Oral Daily 09/27/17 1615 09/30/17 1701   09/26/17 1700  vancomycin (VANCOCIN) IVPB 750 mg/150 ml premix  Status:  Discontinued     750 mg 150 mL/hr over 60 Minutes Intravenous Every 8 hours 09/26/17 1137 09/26/17 1824   09/25/17 2200  levofloxacin (LEVAQUIN) IVPB 750 mg   Status:  Discontinued     750 mg 100 mL/hr over 90 Minutes Intravenous Every 24 hours 09/25/17 0136 09/29/17 1213   09/25/17 0830  vancomycin (VANCOCIN) 500 mg in sodium chloride 0.9 % 100 mL IVPB  Status:  Discontinued     500 mg 100 mL/hr over 60 Minutes Intravenous Every 8 hours 09/25/17 0800 09/26/17 1137   09/25/17 0600  vancomycin (VANCOCIN) 500 mg in sodium chloride 0.9 % 100 mL IVPB  Status:  Discontinued     500 mg 100 mL/hr over 60 Minutes Intravenous Every 8 hours 09/25/17 0136 09/25/17 0800   09/25/17 0600  aztreonam (AZACTAM) 1 g in dextrose 5 % 50 mL IVPB  Status:  Discontinued     1 g 100 mL/hr over 30 Minutes Intravenous Every 8 hours 09/25/17 0136 09/27/17 1615   09/24/17 2345  levofloxacin (LEVAQUIN) IVPB 750 mg     750 mg 100 mL/hr over 90 Minutes Intravenous  Once 09/24/17 2335 09/25/17 0219   09/24/17 2345  aztreonam (AZACTAM) 2 g in dextrose 5 % 50 mL IVPB     2 g 100 mL/hr over 30 Minutes Intravenous  Once 09/24/17 2335 09/25/17 0131   09/24/17 2345  vancomycin (VANCOCIN) IVPB 1000 mg/200 mL premix     1,000 mg 200 mL/hr over 60 Minutes Intravenous  Once 09/24/17 2335 09/25/17 7262      Subjective:   Anthony Yang seen and examined today.  Denies Shortness of breath, abdominal pain, chest pain, dizziness or headache. Would like to go home.  Objective:   Vitals:   10/01/17 0711 10/01/17 0941 10/01/17 1236 10/01/17 1309  BP: (!) 100/55 102/63 (!) 104/54 100/64  Pulse: 95 98 90 98  Resp: _0 Temp: 98.6 F (37 C) 98.7 F (37.1 C) 98.6 F (37 C) 98.4 F (36.9 C)  TempSrc: Oral Oral Oral Oral  SpO2: 100% 100% 100% 100%  Weight:      Height:        Intake/Output Summary (Last 24 hours) at 10/01/17 1401 Last data filed at 10/01/17 1309  Gross per 24 hour  Intake              835 ml  Output                0 ml  Net              835 ml   Filed Weights   09/26/17 0427 09/26/17 2158 09/28/17 0754  Weight: 54.7 kg (120 lb 9.5 oz) 61.9 kg (136  lb 6.4 oz) 64.3 kg (141 lb 12.1 oz)    Exam  General: Well developed, thin, NAD  HEENT: NCAT, mucous membranes moist.   Cardiovascular: S1 S2 auscultated, RRR, no murmurs  Respiratory: Clear to auscultation bilaterally, no wheezing   Abdomen: Soft, nontender, nondistended, + bowel sounds  Extremities: warm dry without cyanosis clubbing or edema  Neuro: AAOx3, nonfocal   Psych: Normal affect and demeanor   Data Reviewed: I have personally reviewed following labs and imaging studies  CBC:  Recent Labs Lab 09/24/17 2227 09/25/17 1101  09/28/17 1514 09/28/17 2134 09/29/17 0406 09/30/17 0247 10/01/17 0309  WBC 6.6 4.2  < > 7.3 5.3 5.4 4.3 3.0*  NEUTROABS 4.3 3.1  --   --   --   --   --   --   HGB 5.4* 7.6*  < > 8.0* 7.9* 7.8* 7.2* 6.4*  HCT 17.1* 23.5*  < > 24.4* 24.0* 23.9* 22.0* 19.6*  MCV 79.2 78.6  < > 80.5 79.7 79.9 79.1 80.0  PLT 180 152  < > 109* 98* 91* 74* 75*  < > = values in this interval not displayed. Basic Metabolic Panel:  Recent Labs Lab 09/25/17 0235 09/27/17 0418 09/28/17 0311 09/29/17 0406 09/30/17 0247  NA 125* 132* 133* 136 132*  K 4.0 3.5 3.8 3.7 3.7  CL 97* 105 109 113* 110  CO2 19* 22 19* 18* 18*  GLUCOSE 134* 111* 97 97 99  BUN <5* <5* <5* 7 7  CREATININE 0.77 0.71 1.00 0.83 0.72  CALCIUM 6.9* 6.9* 6.9* 6.6* 6.8*  MG  --   --   --  1.4* 2.1  PHOS  --   --   --   --  2.8   GFR: Estimated Creatinine Clearance: 120.7 mL/min (by C-G formula based on SCr of 0.72 mg/dL). Liver Function Tests:  Recent Labs Lab 09/24/17 2227 09/27/17 0418  AST 40 19  ALT 12* 9*  ALKPHOS 66 50  BILITOT 1.3* 0.8  PROT 7.2 5.2*  ALBUMIN 2.2* 1.5*   No results for input(s): LIPASE, AMYLASE in the last 168 hours. No results for input(s): AMMONIA in the last 168 hours. Coagulation Profile:  Recent Labs Lab 09/25/17 0006  INR 1.28   Cardiac Enzymes: No results for input(s): CKTOTAL, CKMB, CKMBINDEX, TROPONINI in the last 168 hours. BNP (last  3 results) No results for input(s): PROBNP in the last 8760 hours. HbA1C: No results for input(s): HGBA1C in the last 72 hours. CBG:  Recent Labs Lab 09/25/17 1215 09/25/17 1940 09/25/17 2321 09/26/17 0353 09/28/17 0851  GLUCAP 87 90 110* 103* 83   Lipid Profile: No results for input(s): CHOL, HDL, LDLCALC, TRIG, CHOLHDL, LDLDIRECT in the last 72 hours. Thyroid Function Tests: No results for input(s): TSH, T4TOTAL, FREET4, T3FREE, THYROIDAB in the last 72 hours. Anemia Panel: No results for input(s): VITAMINB12, FOLATE, FERRITIN, TIBC, IRON, RETICCTPCT in the last 72 hours. Urine analysis:    Component Value Date/Time   COLORURINE STRAW (A) 09/24/2017 2249   APPEARANCEUR CLEAR 09/24/2017 2249   LABSPEC 1.001 (L) 09/24/2017 2249   PHURINE 7.0 09/24/2017 2249   GLUCOSEU NEGATIVE 09/24/2017 2249   HGBUR NEGATIVE 09/24/2017 2249   BILIRUBINUR NEGATIVE 09/24/2017 2249   KETONESUR NEGATIVE 09/24/2017 2249   PROTEINUR NEGATIVE 09/24/2017 2249   NITRITE NEGATIVE 09/24/2017 2249   LEUKOCYTESUR NEGATIVE 09/24/2017 2249   Sepsis Labs: _0 (procalcitonin:4,lacticidven:4)  ) Recent Results (from the past 240 hour(s))  Blood Culture (routine x 2)     Status: None   Collection Time: 09/25/17 12:06 AM  Result Value Ref Range Status   Specimen Description BLOOD BLOOD RIGHT FOREARM  Final   Special Requests   Final    BOTTLES DRAWN AEROBIC AND ANAEROBIC Blood Culture adequate volume   Culture NO GROWTH 5 DAYS  Final   Report Status 09/30/2017 FINAL  Final  Blood Culture (routine x 2)     Status: None   Collection Time: 09/25/17 12:36 AM  Result Value Ref Range Status   Specimen Description BLOOD LEFT WRIST  Final   Special Requests   Final  BOTTLES DRAWN AEROBIC AND ANAEROBIC Blood Culture adequate volume   Culture NO GROWTH 5 DAYS  Final   Report Status 09/30/2017 FINAL  Final  MRSA PCR Screening     Status: None   Collection Time: 09/25/17  6:50 AM  Result Value Ref  Range Status   MRSA by PCR NEGATIVE NEGATIVE Final    Comment:        The GeneXpert MRSA Assay (FDA approved for NASAL specimens only), is one component of a comprehensive MRSA colonization surveillance program. It is not intended to diagnose MRSA infection nor to guide or monitor treatment for MRSA infections.   MRSA PCR Screening     Status: None   Collection Time: 09/28/17  8:07 AM  Result Value Ref Range Status   MRSA by PCR NEGATIVE NEGATIVE Final    Comment:        The GeneXpert MRSA Assay (FDA approved for NASAL specimens only), is one component of a comprehensive MRSA colonization surveillance program. It is not intended to diagnose MRSA infection nor to guide or monitor treatment for MRSA infections.   Urine Culture     Status: None   Collection Time: 09/28/17  9:39 AM  Result Value Ref Range Status   Specimen Description URINE, CLEAN CATCH  Final   Special Requests NONE  Final   Culture NO GROWTH  Final   Report Status 09/29/2017 FINAL  Final  Stool culture (children & immunocomp patients)     Status: None   Collection Time: 09/28/17 12:54 PM  Result Value Ref Range Status   Salmonella/Shigella Screen Final report  Final   Campylobacter Culture Final report  Final   E coli, Shiga toxin Assay Negative Negative Final    Comment: (NOTE) Performed At: [facility] [place], [place] [id number] Lindon Romp MD [id number]   Gastrointestinal Panel by PCR , Stool     Status: Abnormal   Collection Time: 09/28/17 12:54 PM  Result Value Ref Range Status   Campylobacter species NOT DETECTED NOT DETECTED Final   Plesimonas shigelloides NOT DETECTED NOT DETECTED Final   Salmonella species NOT DETECTED NOT DETECTED Final   Yersinia enterocolitica NOT DETECTED NOT DETECTED Final   Vibrio species NOT DETECTED NOT DETECTED Final   Vibrio cholerae NOT DETECTED NOT DETECTED Final   Enteroaggregative E coli (EAEC) NOT DETECTED NOT DETECTED Final    Enteropathogenic E coli (EPEC) NOT DETECTED NOT DETECTED Final   Enterotoxigenic E coli (ETEC) NOT DETECTED NOT DETECTED Final   Shiga like toxin producing E coli (STEC) NOT DETECTED NOT DETECTED Final   Shigella/Enteroinvasive E coli (EIEC) NOT DETECTED NOT DETECTED Final   Cryptosporidium NOT DETECTED NOT DETECTED Final   Cyclospora cayetanensis NOT DETECTED NOT DETECTED Final   Entamoeba histolytica NOT DETECTED NOT DETECTED Final   Giardia lamblia DETECTED (A) NOT DETECTED Final   Adenovirus F40/41 NOT DETECTED NOT DETECTED Final   Astrovirus NOT DETECTED NOT DETECTED Final   Norovirus GI/GII NOT DETECTED NOT DETECTED Final   Rotavirus A NOT DETECTED NOT DETECTED Final   Sapovirus (I, II, IV, and V) NOT DETECTED NOT DETECTED Final  STOOL CULTURE REFLEX - RSASHR     Status: None   Collection Time: 09/28/17 12:54 PM  Result Value Ref Range Status   Stool Culture result 1 (RSASHR) Comment  Final    Comment: (NOTE) No Salmonella or Shigella recovered. Performed At: [facility] [place], [place] [id number] Lindon Romp MD [id number]  STOOL CULTURE Reflex - CMPCXR     Status: None   Collection Time: 09/28/17 12:54 PM  Result Value Ref Range Status   Stool Culture result 1 (CMPCXR) Comment  Final    Comment: (NOTE) No Campylobacter species isolated. Performed At: [facility] [place], [place] [id number] Lindon Romp MD [id number]   Aerobic/Anaerobic Culture (surgical/deep wound)     Status: None (Preliminary result)   Collection Time: 09/29/17 10:19 AM  Result Value Ref Range Status   Specimen Description BONE MARROW  Final   Special Requests NONE  Final   Gram Stain   Final    ABUNDANT WBC PRESENT, PREDOMINANTLY MONONUCLEAR NO ORGANISMS SEEN    Culture NO GROWTH 1 DAY  Final   Report Status PENDING  Incomplete  Acid Fast Smear (AFB)     Status: None   Collection Time: 09/29/17 10:19 AM  Result Value Ref Range  Status   AFB Specimen Processing Comment  Final    Comment: Tissue Grinding and Digestion/Decontamination   Acid Fast Smear Negative  Final    Comment: (NOTE) Performed At: [facility] [place], [place] [id number] Lindon Romp MD [id number]    Source (AFB) BONE MARROW  Final    Comment: Performed at [facility], [place] [address] [place], [place] [address]      Radiology Studies: Ct Chest Wo Contrast  Result Date: 09/29/2017 CLINICAL DATA:  Evaluate for PCP.  HIV positive. EXAM: CT CHEST WITHOUT CONTRAST TECHNIQUE: Multidetector CT imaging of the chest was performed following the standard protocol without IV contrast. COMPARISON:  Chest x-ray 09/28/2017 and CT abdomen 09/27/2017 FINDINGS: Cardiovascular: Heart is normal in size. Remaining visualized vascular structures are unremarkable. Mediastinum/Nodes: No definite mediastinal or hilar adenopathy on this noncontrast study. Several small bilateral axillary lymph nodes. Remaining mediastinal structures are unremarkable. Lungs/Pleura: Lungs are adequately inflated demonstrate small bilateral pleural effusions with associated dependent atelectasis. Single sub solid 4 mm nodule over the lingula. Airways are normal. Upper Abdomen: Suggestion of a few small bilateral renal stones. Musculoskeletal: Within normal.  Mild diffuse subcutaneous edema. IMPRESSION: Very small bilateral pleural effusions with associated bibasilar dependent atelectasis. Single 4 mm sub solid nodule over the lingula. Mild diffuse subcutaneous edema. Electronically Signed   By: Marin Olp M.D.   On: 09/29/2017 20:10   Dg Chest Port 1 View  Result Date: 09/30/2017 CLINICAL DATA:  HIV aches, dyspnea, anemia. EXAM: PORTABLE CHEST 1 VIEW COMPARISON:  CT scan of the chest of September 29, 2017 and chest x-ray of September 28, 2017. FINDINGS: The lungs are adequately inflated. There is no focal infiltrate. There is no pleural effusion or  pneumothorax. The heart and pulmonary vascularity are normal. The mediastinum is normal in width. The bony thorax is unremarkable. IMPRESSION: There is no active cardiopulmonary disease. Electronically Signed   By: David  Martinique M.D.   On: 09/30/2017 07:32     Scheduled Meds: . abacavir-dolutegravir-lamiVUDine  1 tablet Oral Daily  . feeding supplement  1 Container Oral TID BM  . fluconazole  100 mg Oral Daily  . metroNIDAZOLE  250 mg Oral Q8H  . pantoprazole  40 mg Oral BID  . sodium chloride flush  3 mL Intravenous Q12H  . sulfamethoxazole-trimethoprim  1 tablet Oral Daily   Continuous Infusions: . sodium chloride 50 mL/hr at 09/30/17 2123  . sodium chloride    . sodium chloride       LOS: 6 days  Time Spent in minutes   30 minutes  Maytte Jacot D.O. on 10/01/2017 at 2:01 PM  Between 7am to 7pm - Pager - [phone]  After 7pm go to [email or web address] - password [email or web address]  And look for the night coverage person covering for me after hours  [facility] Office  [phone]

## 2017-10-01 NOTE — Discharge Summary (Signed)
Physician Discharge Summary  Anthony Yang SWF:[id number] DOB: December 06, 1986 DOA: 09/24/2017  PCP: Patient, No Pcp Per  Admit date: 09/24/2017 Discharge date: 10/02/2017  Time spent: 45 minutes  Recommendations for Outpatient Follow-up:  Patient will be discharged to home.  Patient will need to follow up with primary care provider within one week of discharge, repeat CBC and BMP. Follow up with Infectious disease clinic.  Patient should continue medications as prescribed.  Patient should follow a regular diet.   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 Filiberto Pinks is a 31 y.o. male with medical history significant for HIV/AIDS, lost to follow-up within 2 years ago, now presenting to the emergency department with 2 months of fatigue, generalized weakness, loss of appetite, and malaise. He reports significant weight loss over this interval. Reports occasional chills, but denies fevers. Reports occasional mild crampy lower abdominal pain, but denies dysuria or flank pain. Denies melena or hematochezia. Patient denies chest pain, palpitations, cough, or dyspnea. No rash or wound. No neck stiffness, headache, change in vision or hearing, or focal numbness or weakness. Denies any recent long distance travel or sick contacts. Denies any use of illicit substances. Reports occasional, social use of alcohol. He reports that he is in the process of transferring his HIV care to [facility], but acknowledges that he is not taking any medications for this since early 2016.   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 and had not been on medical therapy since 2016 -Infectious disease consulted and appreciated  -recommended finishing 7 day course of Flagyl, Bactrim DS daily for OI prophylaxis, continue fluconazole  -Started on Triumeq by infectious disease  Severe symptomatic anemia- Acute on chronic  -unclear etiology -hemoglobin was 5.4 on admission  -FOBT +, followed by negative twice, however possibly hemorrhoidal   -patient denies dark tarry stool or BRBPR- doubt GI source -Anemia thought to be acute on chronic given iron studies  -Interventional radiology consulted and appreciated for bone marrow biopsy- done on 09/29/2017 -Discussed with hematology, Dr. Alen Blew, recommended hemolysis panel, but likely anemia is related to HIV/AIDS, would follow up on bone marrow biopsy -Haptoglobin 290, LDH 212, [place] (DAT) negative -Discussed with pathologist, Dr. Orlena Sheldon, Prelim bone marrow biopsy shows HIV/AIDS changes. No granulomas or infectious process.  -patient has received 5u PRBCs this admission, current hemoglobin 8.9 -Repeat CBC in one week  Hyponatremia -possibly due to hypovolemia, low urine sodium  -sodium was 121 on admission, up to 136  Hypokalemia/hypomagnesemia -Replaced, repeat in one week  Severe malnutrition  -in the context of chronic illness -likely due to AIDS wasting -nutrition consulted, continue supplements  Giardia PCR positive -continue flagyl   Consultants Infectious disease Interventional radiology PCCM Hematology/oncology, via  phone, Dr. Alen Blew  Procedures  Bone marrow biopsy   Discharge Exam: Vitals:   10/02/17 1159 10/02/17 1402  BP: 107/64 110/61  Pulse: 91 86  Resp: 18 18  Temp: 98.6 F (37 C) 98.8 F (37.1 C)  SpO2: 100% 100%   Patient denies shortness of breath, chest pain, dizziness, headache. Feels "gassy" abdominal pain, and was able to have a bowel movement this morning.    General: Well developed, thin, NAD  HEENT: NCAT, mucous membranes moist.  Cardiovascular: S1 S2 auscultated, no rubs, murmurs or gallops. Regular rate and rhythm.  Respiratory: Clear to auscultation bilaterally with equal chest rise  Abdomen: Soft, nontender, nondistended, + bowel sounds  Extremities: warm dry without cyanosis clubbing or edema  Neuro: AAOx3, nonfocal  Psych: Appropriate mood and affect  Discharge Instructions Discharge Instructions    Discharge instructions    Complete by:  As directed    Patient will be discharged to home.  Patient will need to follow up with primary care provider within one week of discharge, repeat CBC and BMP. Follow up with Infectious disease clinic.  Patient should continue medications as prescribed.  Patient should follow a regular diet.     Current Discharge Medication List    START taking these medications   Details  abacavir-dolutegravir-lamiVUDine (TRIUMEQ) 600-50-300 MG tablet Take 1 tablet by mouth daily. Qty: 30 tablet, Refills: 0   Associated Diagnoses: Human immunodeficiency virus (HIV) disease ([place])    feeding supplement (BOOST / RESOURCE BREEZE) LIQD Take 1 Container by mouth 3 (three) times daily between meals. Qty: 90 Container, Refills: 0    fluconazole (DIFLUCAN) 100 MG tablet Take 1 tablet (100 mg total) by mouth daily. Qty: 4 tablet, Refills: 0    metroNIDAZOLE (FLAGYL) 250 MG tablet Take 1 tablet (250 mg total) by mouth every 8 (eight) hours. Qty: 18 tablet, Refills: 0    pantoprazole (PROTONIX) 40 MG tablet Take 1 tablet (40 mg total) by  mouth 2 (two) times daily. Qty: 60 tablet, Refills: 0    sulfamethoxazole-trimethoprim (BACTRIM,SEPTRA) 400-80 MG tablet Take 1 tablet by mouth daily. Qty: 30 tablet, Refills: 0      CONTINUE these medications which have NOT CHANGED   Details  OVER THE COUNTER MEDICATION Take 5 mLs by mouth daily. TLC life changes equil to multi vitamin       Allergies  Allergen Reactions  . Penicillins Hives   Follow-up Information    [place] Follow up on 10/10/2017.   Why:  Your appointment time is 11:00 am. Please arrive 15 min early and bring a picture ID and your current medications. Contact information: [place] [phone] [place] for Infectious Disease Follow up.   Specialty:  Infectious Diseases Why:  Lab appointment Tuesday 10/07/17 morning 10:000 [place] call for an appointment - Will need to see you with MD/NP in 3-4 weeks.  Contact information: [place], [place] [id number] [place] [phone]           The results of significant diagnostics from this hospitalization (including imaging, microbiology, ancillary and laboratory) are listed below for reference.    Significant Diagnostic Studies: Dg Chest 2 View  Result Date: 09/24/2017 CLINICAL DATA:  Fever fatigue and weight loss EXAM: CHEST  2 VIEW COMPARISON:  None. FINDINGS: The heart size and mediastinal contours are within normal limits. Both lungs are clear. The visualized skeletal structures are  unremarkable. Artifact over the right chest. IMPRESSION: No active cardiopulmonary disease. Electronically Signed   By: Donavan Foil M.D.   On: 09/24/2017 23:22   Dg Abd 1 View  Result Date: 10/02/2017 CLINICAL DATA:  Loss of appetite and weight loss. EXAM: ABDOMEN - 1 VIEW COMPARISON:  CT 09/27/2017 FINDINGS: Nonobstructive bowel gas pattern. No large abdominal or pelvic  calcifications. Bone structures are unremarkable. IMPRESSION: No acute abnormality. Electronically Signed   By: Markus Daft M.D.   On: 10/02/2017 11:21   Ct Chest Wo Contrast  Result Date: 09/29/2017 CLINICAL DATA:  Evaluate for PCP.  HIV positive. EXAM: CT CHEST WITHOUT CONTRAST TECHNIQUE: Multidetector CT imaging of the chest was performed following the standard protocol without IV contrast. COMPARISON:  Chest x-ray 09/28/2017 and CT abdomen 09/27/2017 FINDINGS: Cardiovascular: Heart is normal in size. Remaining visualized vascular structures are unremarkable. Mediastinum/Nodes: No definite mediastinal or hilar adenopathy on this noncontrast study. Several small bilateral axillary lymph nodes. Remaining mediastinal structures are unremarkable. Lungs/Pleura: Lungs are adequately inflated demonstrate small bilateral pleural effusions with associated dependent atelectasis. Single sub solid 4 mm nodule over the lingula. Airways are normal. Upper Abdomen: Suggestion of a few small bilateral renal stones. Musculoskeletal: Within normal.  Mild diffuse subcutaneous edema. IMPRESSION: Very small bilateral pleural effusions with associated bibasilar dependent atelectasis. Single 4 mm sub solid nodule over the lingula. Mild diffuse subcutaneous edema. Electronically Signed   By: Marin Olp M.D.   On: 09/29/2017 20:10   Ct Abdomen Pelvis W Contrast  Result Date: 09/27/2017 CLINICAL DATA:  Generalized abdominal pain, weight loss. EXAM: CT ABDOMEN AND PELVIS WITH CONTRAST TECHNIQUE: Multidetector CT imaging of the abdomen and pelvis was performed using the standard protocol following bolus administration of intravenous contrast. CONTRAST:  175m ISOVUE-300 IOPAMIDOL (ISOVUE-300) INJECTION 61% COMPARISON:  None. FINDINGS: Lower chest: No acute abnormality. Hepatobiliary: No focal liver abnormality is seen. No gallstones, gallbladder wall thickening, or biliary dilatation. Pancreas: Unremarkable. No pancreatic ductal  dilatation or surrounding inflammatory changes. Spleen: Normal in size without focal abnormality. Adrenals/Urinary Tract: Adrenal glands are unremarkable. Kidneys are normal, without renal calculi, focal lesion, or hydronephrosis. Bladder is unremarkable. Stomach/Bowel: Stomach is within normal limits. Appendix appears normal. No evidence of bowel wall thickening, distention, or inflammatory changes. Vascular/Lymphatic: No significant vascular findings are present. No enlarged abdominal or pelvic lymph nodes. Reproductive: Prostate is unremarkable. Other: Moderate anasarca is noted.  No definite hernia is noted. Musculoskeletal: No acute or significant osseous findings. IMPRESSION: Moderate anasarca is noted. No other abnormality seen in the abdomen or pelvis. Electronically Signed   By: JMarijo Conception M.D.   On: 09/27/2017 20:31   Dg Chest Port 1 View  Result Date: 09/30/2017 CLINICAL DATA:  HIV aches, dyspnea, anemia. EXAM: PORTABLE CHEST 1 VIEW COMPARISON:  CT scan of the chest of September 29, 2017 and chest x-ray of September 28, 2017. FINDINGS: The lungs are adequately inflated. There is no focal infiltrate. There is no pleural effusion or pneumothorax. The heart and pulmonary vascularity are normal. The mediastinum is normal in width. The bony thorax is unremarkable. IMPRESSION: There is no active cardiopulmonary disease. Electronically Signed   By: David  JMartiniqueM.D.   On: 09/30/2017 07:32   Dg Chest Port 1 View  Result Date: 09/28/2017 CLINICAL DATA:  Fever and hypotension. EXAM: PORTABLE CHEST 1 VIEW COMPARISON:  09/24/2017 FINDINGS: The cardiomediastinal silhouette is unremarkable. There is no evidence of focal airspace disease, pulmonary edema, suspicious pulmonary nodule/mass, pleural effusion, or pneumothorax. No  acute bony abnormalities are identified. IMPRESSION: No active disease. Electronically Signed   By: Margarette Canada M.D.   On: 09/28/2017 09:47   Ct Bone Marrow Biopsy &  Aspiration  Result Date: 09/29/2017 INDICATION: Sepsis.  HIV. EXAM: CT BONE MARROW BIOPSY AND ASPIRATION MEDICATIONS: None. ANESTHESIA/SEDATION: Fentanyl 75 mcg IV; Versed 1.5 mg IV Moderate Sedation Time:  13 The patient was continuously monitored during the procedure by the interventional radiology nurse under my direct supervision. FLUOROSCOPY TIME:  Fluoroscopy Time:  minutes  seconds ( mGy). COMPLICATIONS: None immediate. PROCEDURE: Informed written consent was obtained from the patient after a thorough discussion of the procedural risks, benefits and alternatives. All questions were addressed. Maximal Sterile Barrier Technique was utilized including caps, mask, sterile gowns, sterile gloves, sterile drape, hand hygiene and skin antiseptic. A timeout was performed prior to the initiation of the procedure. Under CT guidance, a(n) 11 gauge guide needle was advanced into the right iliac bone via posterior approach. Aspirates and 2 cores were obtained. Samples were are reserved for pathology and culture. Post biopsy images demonstrate no hemorrhage. Patient tolerated the procedure well without complication. Vital sign monitoring by nursing staff during the procedure will continue as patient is in the special procedures unit for post procedure observation. FINDINGS: The images document guide needle placement within the right iliac bone. Post biopsy images demonstrate no hemorrhage. IMPRESSION: Successful CT-guided bone marrow aspirate and core. Electronically Signed   By: Marybelle Killings M.D.   On: 09/29/2017 10:28    Microbiology: Recent Results (from the past 240 hour(s))  Blood Culture (routine x 2)     Status: None   Collection Time: 09/25/17 12:06 AM  Result Value Ref Range Status   Specimen Description BLOOD BLOOD RIGHT FOREARM  Final   Special Requests   Final    BOTTLES DRAWN AEROBIC AND ANAEROBIC Blood Culture adequate volume   Culture NO GROWTH 5 DAYS  Final   Report Status 09/30/2017 FINAL   Final  Blood Culture (routine x 2)     Status: None   Collection Time: 09/25/17 12:36 AM  Result Value Ref Range Status   Specimen Description BLOOD LEFT WRIST  Final   Special Requests   Final    BOTTLES DRAWN AEROBIC AND ANAEROBIC Blood Culture adequate volume   Culture NO GROWTH 5 DAYS  Final   Report Status 09/30/2017 FINAL  Final  MRSA PCR Screening     Status: None   Collection Time: 09/25/17  6:50 AM  Result Value Ref Range Status   MRSA by PCR NEGATIVE NEGATIVE Final    Comment:        The GeneXpert MRSA Assay (FDA approved for NASAL specimens only), is one component of a comprehensive MRSA colonization surveillance program. It is not intended to diagnose MRSA infection nor to guide or monitor treatment for MRSA infections.   MRSA PCR Screening     Status: None   Collection Time: 09/28/17  8:07 AM  Result Value Ref Range Status   MRSA by PCR NEGATIVE NEGATIVE Final    Comment:        The GeneXpert MRSA Assay (FDA approved for NASAL specimens only), is one component of a comprehensive MRSA colonization surveillance program. It is not intended to diagnose MRSA infection nor to guide or monitor treatment for MRSA infections.   Urine Culture     Status: None   Collection Time: 09/28/17  9:39 AM  Result Value Ref Range Status   Specimen Description URINE,  CLEAN CATCH  Final   Special Requests NONE  Final   Culture NO GROWTH  Final   Report Status 09/29/2017 FINAL  Final  Stool culture (children & immunocomp patients)     Status: None   Collection Time: 09/28/17 12:54 PM  Result Value Ref Range Status   Salmonella/Shigella Screen Final report  Final   Campylobacter Culture Final report  Final   E coli, Shiga toxin Assay Negative Negative Final    Comment: (NOTE) Performed At: [facility] [place], [place] [id number] Lindon Romp MD VX:[id number]   Gastrointestinal Panel by PCR , Stool     Status: Abnormal   Collection Time:  09/28/17 12:54 PM  Result Value Ref Range Status   Campylobacter species NOT DETECTED NOT DETECTED Final   Plesimonas shigelloides NOT DETECTED NOT DETECTED Final   Salmonella species NOT DETECTED NOT DETECTED Final   Yersinia enterocolitica NOT DETECTED NOT DETECTED Final   Vibrio species NOT DETECTED NOT DETECTED Final   Vibrio cholerae NOT DETECTED NOT DETECTED Final   Enteroaggregative E coli (EAEC) NOT DETECTED NOT DETECTED Final   Enteropathogenic E coli (EPEC) NOT DETECTED NOT DETECTED Final   Enterotoxigenic E coli (ETEC) NOT DETECTED NOT DETECTED Final   Shiga like toxin producing E coli (STEC) NOT DETECTED NOT DETECTED Final   Shigella/Enteroinvasive E coli (EIEC) NOT DETECTED NOT DETECTED Final   Cryptosporidium NOT DETECTED NOT DETECTED Final   Cyclospora cayetanensis NOT DETECTED NOT DETECTED Final   Entamoeba histolytica NOT DETECTED NOT DETECTED Final   Giardia lamblia DETECTED (A) NOT DETECTED Final   Adenovirus F40/41 NOT DETECTED NOT DETECTED Final   Astrovirus NOT DETECTED NOT DETECTED Final   Norovirus GI/GII NOT DETECTED NOT DETECTED Final   Rotavirus A NOT DETECTED NOT DETECTED Final   Sapovirus (I, II, IV, and V) NOT DETECTED NOT DETECTED Final  STOOL CULTURE REFLEX - RSASHR     Status: None   Collection Time: 09/28/17 12:54 PM  Result Value Ref Range Status   Stool Culture result 1 (RSASHR) Comment  Final    Comment: (NOTE) No Salmonella or Shigella recovered. Performed At: [facility] [place], [place] [id number] Lindon Romp MD QG:[id number]   STOOL CULTURE Reflex - CMPCXR     Status: None   Collection Time: 09/28/17 12:54 PM  Result Value Ref Range Status   Stool Culture result 1 (CMPCXR) Comment  Final    Comment: (NOTE) No Campylobacter species isolated. Performed At: [facility] [place], [place] [id number] Lindon Romp MD DI:[id number]   Aerobic/Anaerobic Culture (surgical/deep wound)      Status: None (Preliminary result)   Collection Time: 09/29/17 10:19 AM  Result Value Ref Range Status   Specimen Description BONE MARROW  Final   Special Requests NONE  Final   Gram Stain   Final    ABUNDANT WBC PRESENT, PREDOMINANTLY MONONUCLEAR NO ORGANISMS SEEN    Culture   Final    NO GROWTH 2 DAYS NO ANAEROBES ISOLATED; CULTURE IN PROGRESS FOR 5 DAYS   Report Status PENDING  Incomplete  Acid Fast Smear (AFB)     Status: None   Collection Time: 09/29/17 10:19 AM  Result Value Ref Range Status   AFB Specimen Processing Comment  Final    Comment: Tissue Grinding and Digestion/Decontamination   Acid Fast Smear Negative  Final    Comment: (NOTE) Performed At: [facility] [address] [place], [place] [id number] Evette Doffing  Darrick Penna MD AC:[id number]    Source (AFB) BONE MARROW  Final    Comment: Performed at [place] [address], [place], [place] [address]     Labs: Basic Metabolic Panel:  Recent Labs Lab 09/27/17 0418 09/28/17 0311 09/29/17 0406 09/30/17 0247 10/02/17 0302  NA 132* 133* 136 132* 136  K 3.5 3.8 3.7 3.7 3.3*  CL 105 109 113* 110 110  CO2 22 19* 18* 18* 19*  GLUCOSE 111* 97 97 99 96  BUN <5* <5* 7 7 <5*  CREATININE 0.71 1.00 0.83 0.72 0.73  CALCIUM 6.9* 6.9* 6.6* 6.8* 7.2*  MG  --   --  1.4* 2.1 1.6*  PHOS  --   --   --  2.8  --    Liver Function Tests:  Recent Labs Lab 09/27/17 0418  AST 19  ALT 9*  ALKPHOS 50  BILITOT 0.8  PROT 5.2*  ALBUMIN 1.5*   No results for input(s): LIPASE, AMYLASE in the last 168 hours. No results for input(s): AMMONIA in the last 168 hours. CBC:  Recent Labs Lab 09/28/17 2134 09/29/17 0406 09/30/17 0247 10/01/17 0309 10/02/17 0302  WBC 5.3 5.4 4.3 3.0* 3.3*  HGB 7.9* 7.8* 7.2* 6.4* 8.9*  HCT 24.0* 23.9* 22.0* 19.6* 26.8*  MCV 79.7 79.9 79.1 80.0 79.5  PLT 98* 91* 74* 75* 76*   Cardiac Enzymes: No results for input(s): CKTOTAL, CKMB, CKMBINDEX, TROPONINI in the last  168 hours. BNP: BNP (last 3 results) No results for input(s): BNP in the last 8760 hours.  ProBNP (last 3 results) No results for input(s): PROBNP in the last 8760 hours.  CBG:  Recent Labs Lab 09/25/17 1940 09/25/17 2321 09/26/17 0353 09/28/17 0851  GLUCAP 90 110* 103* 83       Signed:  Iylah Dworkin  [facility] 10/02/2017, 3:05 PM

## 2017-10-01 NOTE — Progress Notes (Signed)
[facility]  Date of Admission:  09/24/2017     Total days of antibiotics   7  Flagyl 10/2 -   Azithromycin 9/29 - 10/2  Ethambutol 9/29 - 10/2  Levofloxacin 9/26 - 10/1  Vancomycin 9/30 -   Bactrim SS OI Proph - 9/29 -   Fluconazole          ASSESSMENT:  AIDS  - CD4 30 - VL 604,000  VDRF - resolved   Hypotension - resolved   Suspected MAI (wt loss, fever, anemia)  Thrush  Anemia  Being transfused 2 units today again for Hgb 6.4   Protein Calorie Malnutrition, severe  PLAN:  1. Started on Triumeq today - discussed plan with him regarding switching to [place] once ADAP approved. Great appreciation to clinic and pharmacy team to help get meds organized for him.  2. Will need to finish 7 day course of Flagyl and continue Bactrim DS daily for OI proph. Also on fluconazole - would like to keep this going for him as his tongue and roof of mouth was pretty coated. 3. I will make an appointment for labs to recheck CBC in clinic next Tuesday at 10:00. Will also need follow up in a few weeks to check in on ART and recheck HIV labs.  4. BM biopsy report still pending.    Rexene Alberts, MSN, NP-C [facility] Pager: [phone]  10/01/2017  11:48 AM     Principal Problem:   Symptomatic anemia Active Problems:   Sepsis, unspecified organism (HCC)   Hyponatremia   GI bleeding   AIDS (acquired immune deficiency syndrome) (HCC)   Non-compliance   Protein-calorie malnutrition, severe   Dyspnea   Thrush   . abacavir-dolutegravir-lamiVUDine  1 tablet Oral Daily  . feeding supplement  1 Container Oral TID BM  . fluconazole  100 mg Oral Daily  . metroNIDAZOLE  250 mg Oral Q8H  . pantoprazole  40 mg Oral BID  . sodium chloride flush  3 mL Intravenous Q12H  . sulfamethoxazole-trimethoprim  1 tablet Oral Daily    SUBJECTIVE: HPI:  Anthony Yang is a 31 y.o. 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 He has been prev prescribed atripla --> genvoya. Previous CD4 was 25 (01-2015). He has been off meds and out of care for 2 years. He comes to [facility] on 9-27 with 2 months of fatigue, weakness, anorexia. He was found to have temp 103.1 and tachycardia (142). He had Na 121, alb 2.2 and was anemic (hgb 5.4). He was started on vancomycin, azactam and levaquin. His CXR was clear. He was guaic positive. Treatment started for presumed MAI.   No complaints today aside from being tired. Got 1 unit and ready to get a second. Afebrile.   Review of Systems: Review of Systems  Constitutional: Positive for malaise/fatigue. Negative for fever.  Respiratory: Negative.   Cardiovascular: Negative.   Gastrointestinal: Negative for abdominal pain, blood in stool and diarrhea.  Genitourinary: Negative for dysuria.  All other systems reviewed and are negative.   Allergies  Allergen Reactions  . Penicillins Hives    OBJECTIVE: Vitals:   10/01/17 0458 10/01/17 0630 10/01/17 0711 10/01/17 0941  BP: (!) 95/51 (!) 95/57 (!) 100/55 102/63  Pulse: 96 97 95 98  Resp: Temp: 98 F (36.7 C) 99.1  F (37.3 C) 98.6 F (37 C) 98.7 F (37.1 C)  TempSrc: Oral Oral Oral Oral  SpO2: 100% 100% 100% 100%  Weight:      Height:       Body mass index is 22.88 kg/m.  Physical Exam  Constitutional: He is oriented to person, place, and time.  Thin appearing AA male sitting in recliner. Appears as if he is feeling better today.   HENT:  Mouth/Throat: Oropharyngeal exudate present.  Eyes: No scleral icterus.  Cardiovascular: Normal rate, regular rhythm, S1 normal, S2 normal and intact distal pulses.   No murmur heard. Pulmonary/Chest: Effort normal and breath sounds normal.  Abdominal: Soft. Bowel sounds are normal. He exhibits no distension. There is no tenderness.  Lymphadenopathy:    He has no cervical adenopathy.    Neurological: He is alert and oriented to person, place, and time.  Skin: Skin is warm and dry.  Psychiatric: Mood and affect normal.    Lab Results Lab Results  Component Value Date   WBC 3.0 (L) 10/01/2017   HGB 6.4 (LL) 10/01/2017   HCT 19.6 (L) 10/01/2017   MCV 80.0 10/01/2017   PLT 75 (L) 10/01/2017    Lab Results  Component Value Date   CREATININE 0.72 09/30/2017   BUN 7 09/30/2017   NA 132 (L) 09/30/2017   K 3.7 09/30/2017   CL 110 09/30/2017   CO2 18 (L) 09/30/2017    Lab Results  Component Value Date   ALT 9 (L) 09/27/2017   AST 19 09/27/2017   ALKPHOS 50 09/27/2017   BILITOT 0.8 09/27/2017     Microbiology: Recent Results (from the past 240 hour(s))  Blood Culture (routine x 2)     Status: None   Collection Time: 09/25/17 12:06 AM  Result Value Ref Range Status   Specimen Description BLOOD BLOOD RIGHT FOREARM  Final   Special Requests   Final    BOTTLES DRAWN AEROBIC AND ANAEROBIC Blood Culture adequate volume   Culture NO GROWTH 5 DAYS  Final   Report Status 09/30/2017 FINAL  Final  Blood Culture (routine x 2)     Status: None   Collection Time: 09/25/17 12:36 AM  Result Value Ref Range Status   Specimen Description BLOOD LEFT WRIST  Final   Special Requests   Final    BOTTLES DRAWN AEROBIC AND ANAEROBIC Blood Culture adequate volume   Culture NO GROWTH 5 DAYS  Final   Report Status 09/30/2017 FINAL  Final  MRSA PCR Screening     Status: None   Collection Time: 09/25/17  6:50 AM  Result Value Ref Range Status   MRSA by PCR NEGATIVE NEGATIVE Final    Comment:        The GeneXpert MRSA Assay (FDA approved for NASAL specimens only), is one component of a comprehensive MRSA colonization surveillance program. It is not intended to diagnose MRSA infection nor to guide or monitor treatment for MRSA infections.   MRSA PCR Screening     Status: None   Collection Time: 09/28/17  8:07 AM  Result Value Ref Range Status   MRSA by PCR NEGATIVE  NEGATIVE Final    Comment:        The GeneXpert MRSA Assay (FDA approved for NASAL specimens only), is one component of a comprehensive MRSA colonization surveillance program. It is not intended to diagnose MRSA infection nor to guide or monitor treatment for MRSA infections.   Urine Culture     Status: None  Collection Time: 09/28/17  9:39 AM  Result Value Ref Range Status   Specimen Description URINE, CLEAN CATCH  Final   Special Requests NONE  Final   Culture NO GROWTH  Final   Report Status 09/29/2017 FINAL  Final  Stool culture (children & immunocomp patients)     Status: None   Collection Time: 09/28/17 12:54 PM  Result Value Ref Range Status   Salmonella/Shigella Screen Final report  Final   Campylobacter Culture Final report  Final   E coli, Shiga toxin Assay Negative Negative Final    Comment: (NOTE) Performed At: [facility] [address] [place], [place] [id number] Mila Homer MD WU:[id number]   Gastrointestinal Panel by PCR , Stool     Status: Abnormal   Collection Time: 09/28/17 12:54 PM  Result Value Ref Range Status   Campylobacter species NOT DETECTED NOT DETECTED Final   Plesimonas shigelloides NOT DETECTED NOT DETECTED Final   Salmonella species NOT DETECTED NOT DETECTED Final   Yersinia enterocolitica NOT DETECTED NOT DETECTED Final   Vibrio species NOT DETECTED NOT DETECTED Final   Vibrio cholerae NOT DETECTED NOT DETECTED Final   Enteroaggregative E coli (EAEC) NOT DETECTED NOT DETECTED Final   Enteropathogenic E coli (EPEC) NOT DETECTED NOT DETECTED Final   Enterotoxigenic E coli (ETEC) NOT DETECTED NOT DETECTED Final   Shiga like toxin producing E coli (STEC) NOT DETECTED NOT DETECTED Final   Shigella/Enteroinvasive E coli (EIEC) NOT DETECTED NOT DETECTED Final   Cryptosporidium NOT DETECTED NOT DETECTED Final   Cyclospora cayetanensis NOT DETECTED NOT DETECTED Final   Entamoeba histolytica NOT DETECTED NOT DETECTED Final   Giardia  lamblia DETECTED (A) NOT DETECTED Final   Adenovirus F40/41 NOT DETECTED NOT DETECTED Final   Astrovirus NOT DETECTED NOT DETECTED Final   Norovirus GI/GII NOT DETECTED NOT DETECTED Final   Rotavirus A NOT DETECTED NOT DETECTED Final   Sapovirus (I, II, IV, and V) NOT DETECTED NOT DETECTED Final  STOOL CULTURE REFLEX - RSASHR     Status: None   Collection Time: 09/28/17 12:54 PM  Result Value Ref Range Status   Stool Culture result 1 (RSASHR) Comment  Final    Comment: (NOTE) No Salmonella or Shigella recovered. Performed At: [facility] [address] [place], [place] [id number] Mila Homer MD VH:[id number]   STOOL CULTURE Reflex - CMPCXR     Status: None   Collection Time: 09/28/17 12:54 PM  Result Value Ref Range Status   Stool Culture result 1 (CMPCXR) Comment  Final    Comment: (NOTE) No Campylobacter species isolated. Performed At: [facility] [address] [place], [place] [id number] Mila Homer MD UV:[id number]   Aerobic/Anaerobic Culture (surgical/deep wound)     Status: None (Preliminary result)   Collection Time: 09/29/17 10:19 AM  Result Value Ref Range Status   Specimen Description BONE MARROW  Final   Special Requests NONE  Final   Gram Stain   Final    ABUNDANT WBC PRESENT, PREDOMINANTLY MONONUCLEAR NO ORGANISMS SEEN    Culture NO GROWTH 1 DAY  Final   Report Status PENDING  Incomplete  Acid Fast Smear (AFB)     Status: None   Collection Time: 09/29/17 10:19 AM  Result Value Ref Range Status   AFB Specimen Processing Comment  Final    Comment: Tissue Grinding and Digestion/Decontamination   Acid Fast Smear Negative  Final    Comment: (NOTE) Performed At: [facility] [address] [place],  [place] [id number] Mila Homer MD WU:[id number]    Source (AFB) BONE MARROW  Final    Comment: Performed at [facility], [address], [place], [place] [address]

## 2017-10-01 NOTE — Progress Notes (Signed)
CM consulted to assist in having patients CBC checked next week as outpatient. Pt has appointment with [facility] but they were not able to see him until the 24th of October. CM called [facility] and they were able to fit him in Oct 12th. Information on the AVS. MD also provided a prescription for the CBC on the 12th.  Per notes patient [facility]. Pt informed and given prescription for CBC on Friday in case issues arise. He is also aware of appointment on Friday at [facility] for hospital f/u with a PCP.  Pt will need assistance with discharge medications. CM spoke to ID and they have arranged for patient to receive his HIV meds at [facility] outpatient pharmacy. CM provided the patient with MATCH letter to assist with the cost of his discharge medications.  CM following.

## 2017-10-02 ENCOUNTER — Other Ambulatory Visit: Payer: Self-pay | Admitting: *Deleted

## 2017-10-02 ENCOUNTER — Inpatient Hospital Stay (HOSPITAL_COMMUNITY): Payer: Medicaid Other

## 2017-10-02 DIAGNOSIS — R109 Unspecified abdominal pain: Secondary | ICD-10-CM

## 2017-10-02 DIAGNOSIS — D696 Thrombocytopenia, unspecified: Secondary | ICD-10-CM

## 2017-10-02 DIAGNOSIS — B20 Human immunodeficiency virus [HIV] disease: Secondary | ICD-10-CM

## 2017-10-02 DIAGNOSIS — A071 Giardiasis [lambliasis]: Secondary | ICD-10-CM

## 2017-10-02 LAB — BASIC METABOLIC PANEL
Anion gap: 7 (ref 5–15)
CHLORIDE: 110 mmol/L (ref 101–111)
CO2: 19 mmol/L — AB (ref 22–32)
CREATININE: 0.73 mg/dL (ref 0.61–1.24)
Calcium: 7.2 mg/dL — ABNORMAL LOW (ref 8.9–10.3)
GFR calc Af Amer: >60 mL/min (ref >60)
GFR calc non Af Amer: >60 mL/min (ref >60)
GLUCOSE: 96 mg/dL (ref 65–99)
Potassium: 3.3 mmol/L — ABNORMAL LOW (ref 3.5–5.1)
SODIUM: 136 mmol/L (ref 135–145)

## 2017-10-02 LAB — TYPE AND SCREEN
ABO/RH(D): B POS
Antibody Screen: NEGATIVE
UNIT DIVISION: 0
UNIT DIVISION: 0

## 2017-10-02 LAB — CBC
HEMATOCRIT: 26.8 % — AB (ref 39.0–52.0)
HEMOGLOBIN: 8.9 g/dL — AB (ref 13.0–17.0)
MCH: 26.4 pg (ref 26.0–34.0)
MCHC: 33.2 g/dL (ref 30.0–36.0)
MCV: 79.5 fL (ref 78.0–100.0)
Platelets: 76 10*3/uL — ABNORMAL LOW (ref 150–400)
RBC: 3.37 MIL/uL — ABNORMAL LOW (ref 4.22–5.81)
RDW: 17.9 % — ABNORMAL HIGH (ref 11.5–15.5)
WBC: 3.3 10*3/uL — ABNORMAL LOW (ref 4.0–10.5)

## 2017-10-02 LAB — BPAM RBC
Blood Product Expiration Date: 201810092359
Blood Product Expiration Date: 201810092359
ISSUE DATE / TIME: 201810031230
ISSUE DATE / TIME: 201810030638
UNIT TYPE AND RH: 7300
Unit Type and Rh: 7300

## 2017-10-02 LAB — MAGNESIUM: Magnesium: 1.6 mg/dL — ABNORMAL LOW (ref 1.7–2.4)

## 2017-10-02 LAB — HAPTOGLOBIN: HAPTOGLOBIN: 290 mg/dL — AB (ref 34–200)

## 2017-10-02 MED ORDER — SULFAMETHOXAZOLE-TRIMETHOPRIM 400-80 MG PO TABS: 1.0000 | ORAL_TABLET | Freq: Every day | ORAL | 0 refills | Status: AC

## 2017-10-02 MED ORDER — BOOST / RESOURCE BREEZE PO LIQD: 1.0000 | Freq: Three times a day (TID) | ORAL | 0 refills | Status: AC

## 2017-10-02 MED ORDER — PANTOPRAZOLE SODIUM 40 MG PO TBEC: 40.0000 mg | DELAYED_RELEASE_TABLET | Freq: Two times a day (BID) | ORAL | 0 refills | Status: AC

## 2017-10-02 MED ORDER — ABACAVIR-DOLUTEGRAVIR-LAMIVUD 600-50-300 MG PO TABS: 1.0000 | ORAL_TABLET | Freq: Every day | ORAL | 0 refills | Status: DC

## 2017-10-02 MED ORDER — ABACAVIR-DOLUTEGRAVIR-LAMIVUD 600-50-300 MG PO TABS: 1.0000 | ORAL_TABLET | Freq: Every day | ORAL | 5 refills | Status: DC

## 2017-10-02 MED ORDER — SIMETHICONE 80 MG PO CHEW
160.0000 mg | CHEWABLE_TABLET | Freq: Three times a day (TID) | ORAL | Status: DC | PRN
  Administered 2017-10-02: 160 mg via ORAL
  Filled 2017-10-02: qty 2

## 2017-10-02 MED ORDER — MAGNESIUM SULFATE 2 GM/50ML IV SOLN
2.0000 g | Freq: Once | INTRAVENOUS | Status: AC
Start: 2017-10-02 — End: 2017-10-02
  Administered 2017-10-02: 2 g via INTRAVENOUS
  Filled 2017-10-02: qty 50

## 2017-10-02 MED ORDER — METRONIDAZOLE 250 MG PO TABS: 250.0000 mg | ORAL_TABLET | Freq: Three times a day (TID) | ORAL | 0 refills | Status: DC

## 2017-10-02 MED ORDER — FLUCONAZOLE 100 MG PO TABS: 100.0000 mg | ORAL_TABLET | Freq: Every day | ORAL | 0 refills | Status: DC

## 2017-10-02 MED ORDER — POTASSIUM CHLORIDE CRYS ER 20 MEQ PO TBCR
40.0000 meq | EXTENDED_RELEASE_TABLET | Freq: Once | ORAL | Status: AC
  Administered 2017-10-02: 40 meq via ORAL
  Filled 2017-10-02: qty 2

## 2017-10-02 NOTE — Progress Notes (Signed)
Patient discharged home with family. Discharge information and prescription given. IV removed. Patient questions asked and answered.  Patient to dress and will be transported from the unit via wheelchair with staff. Lawson Radar

## 2017-10-02 NOTE — Progress Notes (Signed)
Subjective: No new complaints   Antibiotics:  Anti-infectives    Start     Dose/Rate Route Frequency Ordered Stop   10/01/17 1000  abacavir-dolutegravir-lamiVUDine (TRIUMEQ) 600-50-300 MG per tablet 1 tablet     1 tablet Oral Daily 09/30/17 1703     09/30/17 1800  metroNIDAZOLE (FLAGYL) tablet 250 mg     250 mg Oral Every 8 hours 09/30/17 1541 10/07/17 2159   09/30/17 1000  ethambutol (MYAMBUTOL) tablet 1,000 mg  Status:  Discontinued     1,000 mg Oral Daily 09/29/17 1423 09/30/17 1701   09/28/17 1200  vancomycin (VANCOCIN) IVPB 750 mg/150 ml premix  Status:  Discontinued     750 mg 150 mL/hr over 60 Minutes Intravenous Every 8 hours 09/28/17 1044 09/29/17 1213   09/28/17 0800  elvitegravir-cobicistat-emtricitabine-tenofovir (GENVOYA) 150-150-200-10 MG tablet 1 tablet  Status:  Discontinued     1 tablet Oral Daily with breakfast 09/27/17 1615 09/28/17 1035   09/27/17 1630  fluconazole (DIFLUCAN) tablet 100 mg     100 mg Oral Daily 09/27/17 1615 10/04/17 0959   09/27/17 1630  sulfamethoxazole-trimethoprim (BACTRIM,SEPTRA) 400-80 MG per tablet 1 tablet     1 tablet Oral Daily 09/27/17 1615     09/27/17 1630  ethambutol (MYAMBUTOL) tablet 950 mg  Status:  Discontinued     15 mg/kg  61.9 kg Oral Daily 09/27/17 1615 09/29/17 1423   09/27/17 1630  azithromycin (ZITHROMAX) tablet 500 mg  Status:  Discontinued     500 mg Oral Daily 09/27/17 1615 09/30/17 1701   09/26/17 1700  vancomycin (VANCOCIN) IVPB 750 mg/150 ml premix  Status:  Discontinued     750 mg 150 mL/hr over 60 Minutes Intravenous Every 8 hours 09/26/17 1137 09/26/17 1824   09/25/17 2200  levofloxacin (LEVAQUIN) IVPB 750 mg  Status:  Discontinued     750 mg 100 mL/hr over 90 Minutes Intravenous Every 24 hours 09/25/17 0136 09/29/17 1213   09/25/17 0830  vancomycin (VANCOCIN) 500 mg in sodium chloride 0.9 % 100 mL IVPB  Status:  Discontinued     500 mg 100 mL/hr over 60 Minutes Intravenous Every 8 hours 09/25/17  0800 09/26/17 1137   09/25/17 0600  vancomycin (VANCOCIN) 500 mg in sodium chloride 0.9 % 100 mL IVPB  Status:  Discontinued     500 mg 100 mL/hr over 60 Minutes Intravenous Every 8 hours 09/25/17 0136 09/25/17 0800   09/25/17 0600  aztreonam (AZACTAM) 1 g in dextrose 5 % 50 mL IVPB  Status:  Discontinued     1 g 100 mL/hr over 30 Minutes Intravenous Every 8 hours 09/25/17 0136 09/27/17 1615   09/24/17 2345  levofloxacin (LEVAQUIN) IVPB 750 mg     750 mg 100 mL/hr over 90 Minutes Intravenous  Once 09/24/17 2335 09/25/17 0219   09/24/17 2345  aztreonam (AZACTAM) 2 g in dextrose 5 % 50 mL IVPB     2 g 100 mL/hr over 30 Minutes Intravenous  Once 09/24/17 2335 09/25/17 0131   09/24/17 2345  vancomycin (VANCOCIN) IVPB 1000 mg/200 mL premix     1,000 mg 200 mL/hr over 60 Minutes Intravenous  Once 09/24/17 2335 09/25/17 0219      Medications: Scheduled Meds: . abacavir-dolutegravir-lamiVUDine  1 tablet Oral Daily  . feeding supplement  1 Container Oral TID BM  . fluconazole  100 mg Oral Daily  . metroNIDAZOLE  250 mg Oral Q8H  . pantoprazole  40 mg Oral BID  .  sodium chloride flush  3 mL Intravenous Q12H  . sulfamethoxazole-trimethoprim  1 tablet Oral Daily   Continuous Infusions: . sodium chloride    . sodium chloride     PRN Meds:.acetaminophen **OR** acetaminophen, bisacodyl, LORazepam, ondansetron **OR** ondansetron (ZOFRAN) IV, senna-docusate, simethicone, traMADol    Objective: Weight change:   Intake/Output Summary (Last 24 hours) at 10/02/17 1342 Last data filed at 10/02/17 0438  Gross per 24 hour  Intake              240 ml  Output                0 ml  Net              240 ml   Blood pressure 107/64, pulse 91, temperature 98.6 F (37 C), temperature source Oral, resp. rate 18, height 5' 6"  (1.676 m), weight 141 lb 12.1 oz (64.3 kg), SpO2 100 %. Temp:  [98.2 F (36.8 C)-100.2 F (37.9 C)] 98.6 F (37 C) (10/04 1159) Pulse Rate:  [88-104] 91 (10/04 1159) Resp:   [18] 18 (10/04 1159) BP: (103-109)/(60-70) 107/64 (10/04 1159) SpO2:  [98 %-100 %] 100 % (10/04 1159)  Physical Exam: General: Alert and awake, oriented x3, not in any acute distress. HEENT: anicteric sclera, pupils reactive to light and accommodation, EOMI CVS regular rate, normal r,   Chest:  no wheezing, resp distress Abdomen: soft nontender, nondistended,  Extremities: no  clubbing or edema noted bilaterally Skin: no rashes Neuro: nonfocal  CBC:  CBC Latest Ref Rng & Units 10/02/2017 10/01/2017 09/30/2017  WBC 4.0 - 10.5 K/uL 3.3(L) 3.0(L) 4.3  Hemoglobin 13.0 - 17.0 g/dL 8.9(L) 6.4(LL) 7.2(L)  Hematocrit 39.0 - 52.0 % 26.8(L) 19.6(L) 22.0(L)  Platelets 150 - 400 K/uL 76(L) 75(L) 74(L)     BMET  Recent Labs  09/30/17 0247 10/02/17 0302  NA 132* 136  K 3.7 3.3*  CL 110 110  CO2 18* 19*  GLUCOSE 99 96  BUN 7 <5*  CREATININE 0.72 0.73  CALCIUM 6.8* 7.2*     Liver Panel  No results for input(s): PROT, ALBUMIN, AST, ALT, ALKPHOS, BILITOT, BILIDIR, IBILI in the last 72 hours.     Sedimentation Rate No results for input(s): ESRSEDRATE in the last 72 hours. C-Reactive Protein No results for input(s): CRP in the last 72 hours.  Micro Results: Recent Results (from the past 720 hour(s))  Blood Culture (routine x 2)     Status: None   Collection Time: 09/25/17 12:06 AM  Result Value Ref Range Status   Specimen Description BLOOD BLOOD RIGHT FOREARM  Final   Special Requests   Final    BOTTLES DRAWN AEROBIC AND ANAEROBIC Blood Culture adequate volume   Culture NO GROWTH 5 DAYS  Final   Report Status 09/30/2017 FINAL  Final  Blood Culture (routine x 2)     Status: None   Collection Time: 09/25/17 12:36 AM  Result Value Ref Range Status   Specimen Description BLOOD LEFT WRIST  Final   Special Requests   Final    BOTTLES DRAWN AEROBIC AND ANAEROBIC Blood Culture adequate volume   Culture NO GROWTH 5 DAYS  Final   Report Status 09/30/2017 FINAL  Final  MRSA PCR  Screening     Status: None   Collection Time: 09/25/17  6:50 AM  Result Value Ref Range Status   MRSA by PCR NEGATIVE NEGATIVE Final    Comment:        The GeneXpert  MRSA Assay (FDA approved for NASAL specimens only), is one component of a comprehensive MRSA colonization surveillance program. It is not intended to diagnose MRSA infection nor to guide or monitor treatment for MRSA infections.   MRSA PCR Screening     Status: None   Collection Time: 09/28/17  8:07 AM  Result Value Ref Range Status   MRSA by PCR NEGATIVE NEGATIVE Final    Comment:        The GeneXpert MRSA Assay (FDA approved for NASAL specimens only), is one component of a comprehensive MRSA colonization surveillance program. It is not intended to diagnose MRSA infection nor to guide or monitor treatment for MRSA infections.   Urine Culture     Status: None   Collection Time: 09/28/17  9:39 AM  Result Value Ref Range Status   Specimen Description URINE, CLEAN CATCH  Final   Special Requests NONE  Final   Culture NO GROWTH  Final   Report Status 09/29/2017 FINAL  Final  Stool culture (children & immunocomp patients)     Status: None   Collection Time: 09/28/17 12:54 PM  Result Value Ref Range Status   Salmonella/Shigella Screen Final report  Final   Campylobacter Culture Final report  Final   E coli, Shiga toxin Assay Negative Negative Final    Comment: (NOTE) Performed At: [facility] [place], [place] [id number] Lindon Romp MD IR:[id number]   Gastrointestinal Panel by PCR , Stool     Status: Abnormal   Collection Time: 09/28/17 12:54 PM  Result Value Ref Range Status   Campylobacter species NOT DETECTED NOT DETECTED Final   Plesimonas shigelloides NOT DETECTED NOT DETECTED Final   Salmonella species NOT DETECTED NOT DETECTED Final   Yersinia enterocolitica NOT DETECTED NOT DETECTED Final   Vibrio species NOT DETECTED NOT DETECTED Final   Vibrio cholerae NOT DETECTED  NOT DETECTED Final   Enteroaggregative E coli (EAEC) NOT DETECTED NOT DETECTED Final   Enteropathogenic E coli (EPEC) NOT DETECTED NOT DETECTED Final   Enterotoxigenic E coli (ETEC) NOT DETECTED NOT DETECTED Final   Shiga like toxin producing E coli (STEC) NOT DETECTED NOT DETECTED Final   Shigella/Enteroinvasive E coli (EIEC) NOT DETECTED NOT DETECTED Final   Cryptosporidium NOT DETECTED NOT DETECTED Final   Cyclospora cayetanensis NOT DETECTED NOT DETECTED Final   Entamoeba histolytica NOT DETECTED NOT DETECTED Final   Giardia lamblia DETECTED (A) NOT DETECTED Final   Adenovirus F40/41 NOT DETECTED NOT DETECTED Final   Astrovirus NOT DETECTED NOT DETECTED Final   Norovirus GI/GII NOT DETECTED NOT DETECTED Final   Rotavirus A NOT DETECTED NOT DETECTED Final   Sapovirus (I, II, IV, and V) NOT DETECTED NOT DETECTED Final  STOOL CULTURE REFLEX - RSASHR     Status: None   Collection Time: 09/28/17 12:54 PM  Result Value Ref Range Status   Stool Culture result 1 (RSASHR) Comment  Final    Comment: (NOTE) No Salmonella or Shigella recovered. Performed At: [facility] [place], [place] [id number] Lindon Romp MD IZ:[id number]   STOOL CULTURE Reflex - CMPCXR     Status: None   Collection Time: 09/28/17 12:54 PM  Result Value Ref Range Status   Stool Culture result 1 (CMPCXR) Comment  Final    Comment: (NOTE) No Campylobacter species isolated. Performed At: [facility] [address] [place], [place] [id number] Lindon Romp MD QB:[id number]   Aerobic/Anaerobic Culture (surgical/deep wound)     Status:  None (Preliminary result)   Collection Time: 09/29/17 10:19 AM  Result Value Ref Range Status   Specimen Description BONE MARROW  Final   Special Requests NONE  Final   Gram Stain   Final    ABUNDANT WBC PRESENT, PREDOMINANTLY MONONUCLEAR NO ORGANISMS SEEN    Culture   Final    NO GROWTH 2 DAYS NO ANAEROBES ISOLATED; CULTURE IN PROGRESS FOR 5  DAYS   Report Status PENDING  Incomplete  Acid Fast Smear (AFB)     Status: None   Collection Time: 09/29/17 10:19 AM  Result Value Ref Range Status   AFB Specimen Processing Comment  Final    Comment: Tissue Grinding and Digestion/Decontamination   Acid Fast Smear Negative  Final    Comment: (NOTE) Performed At: [facility] [place], [place] [id number] Lindon Romp MD ZW:[id number]    Source (AFB) BONE MARROW  Final    Comment: Performed at [facility] [place] [address], [place], [place] [address]    Studies/Results: Dg Abd 1 View  Result Date: 10/02/2017 CLINICAL DATA:  Loss of appetite and weight loss. EXAM: ABDOMEN - 1 VIEW COMPARISON:  CT 09/27/2017 FINDINGS: Nonobstructive bowel gas pattern. No large abdominal or pelvic calcifications. Bone structures are unremarkable. IMPRESSION: No acute abnormality. Electronically Signed   By: Markus Daft M.D.   On: 10/02/2017 11:21      Assessment/Plan:  INTERVAL HISTORY: sp transfusion    Principal Problem:   Symptomatic anemia Active Problems:   Sepsis, unspecified organism ([place])   Hyponatremia   GI bleeding   AIDS (acquired immune deficiency syndrome) (HCC)   Non-compliance   Protein-calorie malnutrition, severe   Dyspnea   Thrush   Giardia    Anthony Yang is a 31 y.o. male with  With HIV/AIDS, thrush, admission with fevers, found to have Giardia, also with profound anemia sp bone marrow--which is still not back?? But AFB smear is negative  #1 HIV/AIDS: continue TRIUMEQ and bactrim for PCP prevention. He has HSFU with Janene Madeira on 10/07/2017 at 9am  #2 Giardia: finish course of flagyl  #3 thrush finish 14 days of fluconazole  #4 Anemia: followup bone marrow biopsy   LOS: 7 days   Alcide Evener 10/02/2017, 1:42 PM

## 2017-10-02 NOTE — Care Management Note (Signed)
Case Management Note  Patient Details  Name: BAYAN KUSHNIR MRN: [id number] Date of Birth: 11/26/1986  Subjective/Objective:                    Action/Plan: Pt discharging home with self care. Pt has MATCH letter provided him yesterday from CM. Pt has transportation home. No further needs per CM.  Expected Discharge Date:  10/02/17               Expected Discharge Plan:  Home/Self Care  In-House Referral:     Discharge planning Services  CM Consult, [facility], Follow-up appt scheduled, Medication Assistance  Post Acute Care Choice:    Choice offered to:     DME Arranged:    DME Agency:     HH Arranged:    HH Agency:     Status of Service:  Completed, signed off  If discussed at [employer], dates discussed:    Additional Comments:  Kermit Balo, RN 10/02/2017, 2:33 PM

## 2017-10-04 LAB — AEROBIC/ANAEROBIC CULTURE (SURGICAL/DEEP WOUND): CULTURE: NO GROWTH

## 2017-10-04 LAB — AEROBIC/ANAEROBIC CULTURE W GRAM STAIN (SURGICAL/DEEP WOUND)

## 2017-10-07 ENCOUNTER — Ambulatory Visit: Payer: Self-pay

## 2017-10-07 ENCOUNTER — Other Ambulatory Visit: Payer: Self-pay

## 2017-10-07 LAB — HIV-1 RNA ULTRAQUANT REFLEX TO GENTYP+
HIV-1 RNA BY PCR: 493000 copies/mL
HIV-1 RNA BY PCR: 611000 copies/mL
HIV-1 RNA Quant, Log: 5.693 log10copy/mL
HIV-1 RNA Quant, Log: 5.786 log10copy/mL

## 2017-10-07 LAB — REFLEX TO GENOSURE(R) MG

## 2017-10-08 ENCOUNTER — Encounter: Payer: Self-pay | Admitting: Infectious Diseases

## 2017-10-10 ENCOUNTER — Inpatient Hospital Stay: Payer: Self-pay

## 2017-10-10 LAB — CHROMOSOME ANALYSIS, BONE MARROW

## 2017-10-15 ENCOUNTER — Encounter (HOSPITAL_COMMUNITY): Payer: Self-pay

## 2017-10-18 ENCOUNTER — Emergency Department (HOSPITAL_COMMUNITY): Payer: Medicaid Other

## 2017-10-18 ENCOUNTER — Encounter (HOSPITAL_COMMUNITY): Payer: Self-pay

## 2017-10-18 ENCOUNTER — Emergency Department (HOSPITAL_COMMUNITY)
Admission: EM | Admit: 2017-10-18 | Discharge: 2017-10-19 | Disposition: A | Discharge status: Home / Self Care | Payer: Medicaid Other | Attending: Emergency Medicine | Admitting: Emergency Medicine

## 2017-10-18 DIAGNOSIS — E860 Dehydration: Secondary | ICD-10-CM | POA: Diagnosis not present

## 2017-10-18 DIAGNOSIS — R5383 Other fatigue: Secondary | ICD-10-CM | POA: Diagnosis not present

## 2017-10-18 DIAGNOSIS — R000 Tachycardia, unspecified: Secondary | ICD-10-CM | POA: Diagnosis not present

## 2017-10-18 DIAGNOSIS — Z79899 Other long term (current) drug therapy: Secondary | ICD-10-CM | POA: Insufficient documentation

## 2017-10-18 DIAGNOSIS — Z21 Asymptomatic human immunodeficiency virus [HIV] infection status: Secondary | ICD-10-CM | POA: Insufficient documentation

## 2017-10-18 DIAGNOSIS — R531 Weakness: Secondary | ICD-10-CM | POA: Diagnosis present

## 2017-10-18 LAB — URINALYSIS, ROUTINE W REFLEX MICROSCOPIC
Bilirubin Urine: NEGATIVE
GLUCOSE, UA: NEGATIVE mg/dL
Hgb urine dipstick: NEGATIVE
Ketones, ur: NEGATIVE mg/dL
Leukocytes, UA: NEGATIVE
Nitrite: NEGATIVE
PH: 6 (ref 5.0–8.0)
Protein, ur: 100 mg/dL — AB
Specific Gravity, Urine: 1.038 — ABNORMAL HIGH (ref 1.005–1.030)

## 2017-10-18 LAB — COMPREHENSIVE METABOLIC PANEL
ALT: 21 U/L (ref 17–63)
ANION GAP: 10 (ref 5–15)
AST: 30 U/L (ref 15–41)
Albumin: 1.7 g/dL — ABNORMAL LOW (ref 3.5–5.0)
Alkaline Phosphatase: 110 U/L (ref 38–126)
BILIRUBIN TOTAL: 1.4 mg/dL — AB (ref 0.3–1.2)
BUN: 18 mg/dL (ref 6–20)
CHLORIDE: 95 mmol/L — AB (ref 101–111)
CO2: 19 mmol/L — ABNORMAL LOW (ref 22–32)
Calcium: 7.8 mg/dL — ABNORMAL LOW (ref 8.9–10.3)
Creatinine, Ser: 0.92 mg/dL (ref 0.61–1.24)
GFR calc Af Amer: >60 mL/min (ref >60)
Glucose, Bld: 134 mg/dL — ABNORMAL HIGH (ref 65–99)
POTASSIUM: 4.2 mmol/L (ref 3.5–5.1)
Sodium: 124 mmol/L — ABNORMAL LOW (ref 135–145)
TOTAL PROTEIN: 6 g/dL — AB (ref 6.5–8.1)

## 2017-10-18 LAB — CBC WITH DIFFERENTIAL/PLATELET
BASOS ABS: 0 10*3/uL (ref 0.0–0.1)
Basophils Relative: 0 %
EOS PCT: 0 %
Eosinophils Absolute: 0 10*3/uL (ref 0.0–0.7)
HEMATOCRIT: 31.3 % — AB (ref 39.0–52.0)
Hemoglobin: 10.7 g/dL — ABNORMAL LOW (ref 13.0–17.0)
LYMPHS ABS: 1.5 10*3/uL (ref 0.7–4.0)
Lymphocytes Relative: 15 %
MCH: 27 pg (ref 26.0–34.0)
MCHC: 34.2 g/dL (ref 30.0–36.0)
MCV: 78.8 fL (ref 78.0–100.0)
MONO ABS: 2.6 10*3/uL — AB (ref 0.1–1.0)
MONOS PCT: 25 %
NEUTROS ABS: 6.2 10*3/uL (ref 1.7–7.7)
Neutrophils Relative %: 60 %
PLATELETS: 180 10*3/uL (ref 150–400)
RBC: 3.97 MIL/uL — AB (ref 4.22–5.81)
RDW: 18.7 % — AB (ref 11.5–15.5)
WBC: 10.3 10*3/uL (ref 4.0–10.5)

## 2017-10-18 LAB — MAGNESIUM: Magnesium: 1.9 mg/dL (ref 1.7–2.4)

## 2017-10-18 LAB — TYPE AND SCREEN
ABO/RH(D): B POS
ANTIBODY SCREEN: NEGATIVE

## 2017-10-18 LAB — LACTATE DEHYDROGENASE: LDH: 232 U/L — ABNORMAL HIGH (ref 98–192)

## 2017-10-18 LAB — LIPASE, BLOOD: LIPASE: 41 U/L (ref 11–51)

## 2017-10-18 LAB — I-STAT CG4 LACTIC ACID, ED
LACTIC ACID, VENOUS: 1.77 mmol/L (ref 0.5–1.9)
Lactic Acid, Venous: 0.64 mmol/L (ref 0.5–1.9)

## 2017-10-18 LAB — PROTIME-INR
INR: 1.31
Prothrombin Time: 16.2 seconds — ABNORMAL HIGH (ref 11.4–15.2)

## 2017-10-18 MED ORDER — SODIUM CHLORIDE 0.9 % IV BOLUS (SEPSIS)
1000.0000 mL | Freq: Once | INTRAVENOUS | Status: AC
  Administered 2017-10-18: 1000 mL via INTRAVENOUS

## 2017-10-18 NOTE — ED Notes (Signed)
Patient transported to X-ray 

## 2017-10-18 NOTE — ED Notes (Signed)
Pt walked per md request. HR was 136 while walking. Pt steady on feet.

## 2017-10-18 NOTE — Discharge Instructions (Signed)
Please stay hydrated.  We did not find evidence of infection today.  Please follow-up with your primary doctor next week as previously scheduled.  If any symptoms change or worsen before your visit, please return to the nearest emergency department.

## 2017-10-18 NOTE — ED Notes (Signed)
Patient denies pain and is resting comfortably.  

## 2017-10-18 NOTE — ED Triage Notes (Signed)
Pt presents with continued fatigue since being discharged from here x 2 weeks.  Pt received 5 units of blood during admission, is not eating or drinking much per family.

## 2017-10-18 NOTE — ED Notes (Addendum)
Patient is resting

## 2017-10-18 NOTE — ED Provider Notes (Signed)
[facility] EMERGENCY DEPARTMENT Provider Note   CSN: [id number] Arrival date & time: 10/18/17  1504     History   Chief Complaint Chief Complaint  Patient presents with  . Weakness    HPI Anthony Yang is a 31 y.o. male.  The history is provided by the patient, a parent and medical records.  Illness  This is a recurrent problem. The current episode started more than 1 week ago. The problem occurs constantly. The problem has been gradually worsening. Pertinent negatives include no chest pain, no abdominal pain, no headaches and no shortness of breath. Nothing aggravates the symptoms. Nothing relieves the symptoms. He has tried nothing for the symptoms. The treatment provided no relief.    Past Medical History:  Diagnosis Date  . AIDS (acquired immune deficiency syndrome) (HCC)    hx/notes 09/25/2017  . Anemia   . History of blood transfusion 09/25/2017   "anemic"  . HIV (human immunodeficiency virus infection) (HCC)    hx/notes 09/24/2017    Patient Active Problem List   Diagnosis Date Noted  . Human immunodeficiency virus (HIV) disease (HCC)   . Giardia   . Dyspnea   . Thrush   . Protein-calorie malnutrition, severe 09/27/2017  . Sepsis, unspecified organism (HCC) 09/25/2017  . Symptomatic anemia 09/25/2017  . Hyponatremia 09/25/2017  . GI bleeding 09/25/2017  . AIDS (acquired immune deficiency syndrome) (HCC) 09/25/2017  . Non-compliance 09/25/2017  . Anemia   . Fever     Past Surgical History:  Procedure Laterality Date  . FRACTURE SURGERY    . ORIF FINGER / THUMB FRACTURE Right   . WRIST FRACTURE SURGERY Left        Home Medications    Prior to Admission medications   Medication Sig Start Date End Date Taking? Authorizing Provider  abacavir-dolutegravir-lamiVUDine (TRIUMEQ) 600-50-300 MG tablet Take 1 tablet by mouth daily. 10/02/17   [name], Nita Sells, DO  feeding supplement (BOOST / RESOURCE BREEZE) LIQD Take 1 Container by  mouth 3 (three) times daily between meals. 10/02/17   [name], Nita Sells, DO  fluconazole (DIFLUCAN) 100 MG tablet Take 1 tablet (100 mg total) by mouth daily. 10/03/17   [name], Nita Sells, DO  metroNIDAZOLE (FLAGYL) 250 MG tablet Take 1 tablet (250 mg total) by mouth every 8 (eight) hours. 10/02/17   [name], Nita Sells, DO  OVER THE COUNTER MEDICATION Take 5 mLs by mouth daily. TLC life changes equil to multi vitamin    [provider]  pantoprazole (PROTONIX) 40 MG tablet Take 1 tablet (40 mg total) by mouth 2 (two) times daily. 10/02/17   [name], Nita Sells, DO  sulfamethoxazole-trimethoprim (BACTRIM,SEPTRA) 400-80 MG tablet Take 1 tablet by mouth daily. 10/03/17   Edsel Petrin, DO    Family History History reviewed. No pertinent family history.  Social History Social History  Substance Use Topics  . Smoking status: Never Smoker  . Smokeless tobacco: Never Used  . Alcohol use 1.8 oz/week    3 Shots of liquor per week     Allergies   Penicillins   Review of Systems Review of Systems  Constitutional: Positive for fatigue. Negative for chills, diaphoresis and fever.  HENT: Negative for congestion.   Eyes: Negative for visual disturbance.  Respiratory: Negative for cough, chest tightness, shortness of breath, wheezing and stridor.   Cardiovascular: Negative for chest pain, palpitations and leg swelling.  Gastrointestinal: Positive for constipation, diarrhea and nausea. Negative for abdominal pain and vomiting.  Genitourinary: Negative for dysuria, flank pain and  frequency.  Musculoskeletal: Negative for back pain, neck pain and neck stiffness.  Skin: Negative for rash and wound.  Neurological: Negative for weakness, light-headedness, numbness and headaches.  Psychiatric/Behavioral: Negative for agitation.  All other systems reviewed and are negative.    Physical Exam Updated Vital Signs BP 104/74 (BP Location: Right Arm)   Pulse (!) 138   Temp 97.6 F (36.4 C) (Oral)    Resp 16   SpO2 100%   Physical Exam  Constitutional: He is oriented to person, place, and time. He appears well-developed and well-nourished. No distress.  HENT:  Head: Normocephalic and atraumatic.  Mouth/Throat: Oropharynx is clear and moist. No oropharyngeal exudate.  Eyes: Pupils are equal, round, and reactive to light. Conjunctivae and EOM are normal.  Neck: Normal range of motion. Neck supple.  Cardiovascular: Regular rhythm and intact distal pulses.  Exam reveals no gallop.   No murmur heard. HR in the 140's on exam   Pulmonary/Chest: Effort normal and breath sounds normal. No stridor. No respiratory distress. He has no wheezes. He has no rales. He exhibits no tenderness.  Abdominal: Soft. There is no tenderness.  Musculoskeletal: He exhibits no edema.  Neurological: He is alert and oriented to person, place, and time. No sensory deficit. He exhibits normal muscle tone.  Skin: Skin is warm and dry. Capillary refill takes less than 2 seconds. He is not diaphoretic. No erythema. No pallor.  Psychiatric: He has a normal mood and affect.  Nursing note and vitals reviewed.    ED Treatments / Results  Labs (all labs ordered are listed, but only abnormal results are displayed) Labs Reviewed  CBC WITH DIFFERENTIAL/PLATELET - Abnormal; Notable for the following:       Result Value   RBC 3.97 (*)    Hemoglobin 10.7 (*)    HCT 31.3 (*)    RDW 18.7 (*)    Monocytes Absolute 2.6 (*)    All other components within normal limits  COMPREHENSIVE METABOLIC PANEL - Abnormal; Notable for the following:    Sodium 124 (*)    Chloride 95 (*)    CO2 19 (*)    Glucose, Bld 134 (*)    Calcium 7.8 (*)    Total Protein 6.0 (*)    Albumin 1.7 (*)    Total Bilirubin 1.4 (*)    All other components within normal limits  PROTIME-INR - Abnormal; Notable for the following:    Prothrombin Time 16.2 (*)    All other components within normal limits  LACTATE DEHYDROGENASE - Abnormal; Notable for  the following:    LDH 232 (*)    All other components within normal limits  URINALYSIS, ROUTINE W REFLEX MICROSCOPIC - Abnormal; Notable for the following:    Color, Urine AMBER (*)    APPearance HAZY (*)    Specific Gravity, Urine 1.038 (*)    Protein, ur 100 (*)    Bacteria, UA RARE (*)    Squamous Epithelial / LPF 0-5 (*)    All other components within normal limits  URINE CULTURE  CULTURE, BLOOD (ROUTINE X 2)  CULTURE, BLOOD (ROUTINE X 2)  LIPASE, BLOOD  MAGNESIUM  I-STAT CG4 LACTIC ACID, ED  I-STAT CG4 LACTIC ACID, ED  TYPE AND SCREEN    EKG  EKG Interpretation  Date/Time:  Saturday October 18 2017 18:01:36 EDT Ventricular Rate:  120 PR Interval:    QRS Duration: 71 QT Interval:  291 QTC Calculation: 412 R Axis:   70 Text Interpretation:  Sinus tachycardia When comapred to prior, similar tachycardia.  No STEMI Confirmed by Theda Belfastegeler, Chris ([id number]) on 10/18/2017 7:30:06 PM       Radiology Dg Chest 2 View  Result Date: 10/18/2017 CLINICAL DATA:  Weakness EXAM: CHEST  2 VIEW COMPARISON:  09/30/2017 FINDINGS: Cardiac shadow is within normal limits. The lungs are well aerated bilaterally. Bilateral nipple shadows are seen. No focal infiltrate or effusion is noted. No bony abnormality is seen. IMPRESSION: No active cardiopulmonary disease. Electronically Signed   By: Alcide CleverMark  Lukens M.D.   On: 10/18/2017 17:46    Procedures Procedures (including critical care time)  Medications Ordered in ED Medications  sodium chloride 0.9 % bolus 1,000 mL (0 mLs Intravenous Stopped 10/18/17 1925)  sodium chloride 0.9 % bolus 1,000 mL (1,000 mLs Intravenous New Bag/Given 10/18/17 2304)     Initial Impression / Assessment and Plan / ED Course  I have reviewed the triage vital signs and the nursing notes.  Pertinent labs & imaging results that were available during my care of the patient were reviewed by me and considered in my medical decision making (see chart for details).      Anthony Yang is a 31 y.o. male with a past medical history significant for HIV/AIDS with recent sepsis who presents with extreme fatigue, decreased oral intake, and tachycardia.  Patient is accompanied by his parents.  Patient reports that he has been taking his medications that he was prescribed at discharge 2 weeks ago.  Patient was previously lost to follow-up for HIV.  Patient reports that he has had no fevers or chills, no cough, no urinary symptoms but has chronic intermittent constipation and diarrhea.  He says that he has been having worsened fatigue and no appetite.  He says that he has not been eating and drinking for the last 2 weeks.  He denies any chest pain or shortness of breath, he denies any neurologic complaints.  He is feeling "drained".  In triage, patient's heart rate was in the 130s and 140s.  Patient appears dehydrated.  Mucous membranes are dry on exam.  Lungs were clear and chest was nontender.  Abdomen nontender.  No CVA tenderness.  Given patient's fast heart rate, patient will be given fluids to attempt to treat dehydration.  With his recent admission, patient had workup for occult infection causing his tachycardia.  Patient will have laboratory testing and imaging to further evaluate.  Anticipate reassessment after workup.  10:53 PM Patient's diagnostic testing is which are overall reassuring.  Patient has no evidence of urinary tract infection.  Lactic acid negative x2.  Lipase not elevated.  LDH similarly elevated.  Magnesium normal.  CBC shows no leukocytosis however the white blood cell count is elevated compared to previous.  Platelets are also improved.  Hemoglobin improved with anemia.  Metabolic panel revealed low calcium, low sodium, and low chloride.  CO2 is similar to prior.  No evidence of kidney or liver dysfunction.  Chest x-ray shows no pneumonia.  As there is does not appear to be infection seen on the patient's workup, suspect dehydration in the setting  of his fatigue and decreased oral intake.  Patient will be given a second liter of normal saline.  Patient's heart rate improved into the 1 teens and 120s however orthostatics will be checked.  Patient will also have heart rate measured when he walks around the emergency department.    If the patient continues to have severe tachycardia or is hypotensive with orthostatics, suspect  patient may need admission for rehydration.  If symptoms improve and patient appears well, he will likely be stable for discharge home with PCP follow-up.  Patient was reassessed and able to walk around.  His heart rate did jump up into the 130s however, he was symptom-free.  He does not want to stay in the hospital.  He will get his second liter of normal saline but he wants to be discharged.  Given his reassuring workup with no evidence of infection and his improvement in fatigue after fluids, suspect dehydration is the primary cause of symptoms.  Patient and family agree with plan of discharge home with plans to follow-up with PCP this week.   Fara Olden had no other questions or concerns and was discharged in good condition.   Final Clinical Impressions(s) / ED Diagnoses   Final diagnoses:  Dehydration  Fatigue, unspecified type  Tachycardia    New Prescriptions New Prescriptions   No medications on file    Clinical Impression: 1. Dehydration   2. Fatigue, unspecified type   3. Tachycardia     Disposition: Discharge  Condition: Good  I have discussed the results, Dx and Tx plan with the pt(& family if present). He/she/they expressed understanding and agree(s) with the plan. Discharge instructions discussed at great length. Strict return precautions discussed and pt &/or family have verbalized understanding of the instructions. No further questions at time of discharge.    New Prescriptions   No medications on file    Follow Up: [facility] [address]  [place] [address] [phone] Schedule an appointment as soon as possible for a visit    [facility] EMERGENCY DEPARTMENT [address] [id number] mc [place] [address] [phone]  If symptoms worsen     Tegeler, Canary Brim, MD 10/19/17 [phone]

## 2017-10-18 NOTE — ED Notes (Signed)
Pt ambulated while on pulse oximetry, HR remained in the 130-135 range and O2 sats at 100%.

## 2017-10-18 NOTE — ED Notes (Signed)
Patient is resting with family at beside and call bell in reach

## 2017-10-20 LAB — URINE CULTURE: Culture: <10000 — AB

## 2017-10-21 ENCOUNTER — Other Ambulatory Visit: Payer: Self-pay | Admitting: Infectious Diseases

## 2017-10-21 DIAGNOSIS — B20 Human immunodeficiency virus [HIV] disease: Secondary | ICD-10-CM

## 2017-10-21 MED ORDER — ABACAVIR-DOLUTEGRAVIR-LAMIVUD 600-50-300 MG PO TABS: 1.0000 | ORAL_TABLET | Freq: Every day | ORAL | 0 refills | Status: DC

## 2017-10-22 ENCOUNTER — Ambulatory Visit: Payer: Self-pay | Admitting: Family Medicine

## 2017-10-22 NOTE — Progress Notes (Deleted)
Patient ID: Anthony Yang, male   DOB: 16-Apr-1986, 31 y.o.   MRN: [id number] After being seen in the ED 10/18/2017 for fatigue and dehydration.    From ED note: Anthony Yang is a 31 y.o. male with a past medical history significant for HIV/AIDS with recent sepsis who presents with extreme fatigue, decreased oral intake, and tachycardia.  Patient is accompanied by his parents.  Patient reports that he has been taking his medications that he was prescribed at discharge 2 weeks ago.  Patient was previously lost to follow-up for HIV.  Patient reports that he has had no fevers or chills, no cough, no urinary symptoms but has chronic intermittent constipation and diarrhea.  He says that he has been having worsened fatigue and no appetite.  He says that he has not been eating and drinking for the last 2 weeks.  He denies any chest pain or shortness of breath, he denies any neurologic complaints.  He is feeling "drained".  In triage, patient's heart rate was in the 130s and 140s.  Patient appears dehydrated.  Mucous membranes are dry on exam.  Lungs were clear and chest was nontender.  Abdomen nontender.  No CVA tenderness.  Given patient's fast heart rate, patient will be given fluids to attempt to treat dehydration.  With his recent admission, patient had workup for occult infection causing his tachycardia.  Patient will have laboratory testing and imaging to further evaluate.  Anticipate reassessment after workup.  10:53 PM Patient's diagnostic testing is which are overall reassuring.  Patient has no evidence of urinary tract infection.  Lactic acid negative x2.  Lipase not elevated.  LDH similarly elevated.  Magnesium normal.  CBC shows no leukocytosis however the white blood cell count is elevated compared to previous.  Platelets are also improved.  Hemoglobin improved with anemia.  Metabolic panel revealed low calcium, low sodium, and low chloride.  CO2 is similar to prior.  No evidence of kidney or  liver dysfunction.  Chest x-ray shows no pneumonia.  As there is does not appear to be infection seen on the patient's workup, suspect dehydration in the setting of his fatigue and decreased oral intake.  Patient will be given a second liter of normal saline.  Patient's heart rate improved into the 1 teens and 120s however orthostatics will be checked.  Patient will also have heart rate measured when he walks around the emergency department.    If the patient continues to have severe tachycardia or is hypotensive with orthostatics, suspect patient may need admission for rehydration.  If symptoms improve and patient appears well, he will likely be stable for discharge home with PCP follow-up.  Patient was reassessed and able to walk around.  His heart rate did jump up into the 130s however, he was symptom-free.  He does not want to stay in the hospital.  He will get his second liter of normal saline but he wants to be discharged.  Given his reassuring workup with no evidence of infection and his improvement in fatigue after fluids, suspect dehydration is the primary cause of symptoms.  Patient and family agree with plan of discharge home with plans to follow-up with PCP this week.

## 2017-10-23 ENCOUNTER — Encounter: Payer: Self-pay | Admitting: Infectious Diseases

## 2017-10-23 ENCOUNTER — Encounter (HOSPITAL_COMMUNITY): Payer: Self-pay

## 2017-10-23 ENCOUNTER — Inpatient Hospital Stay (HOSPITAL_COMMUNITY)
Admission: EM | Admit: 2017-10-23 | Discharge: 2017-11-29 | DRG: 974 | Disposition: E | Payer: Medicaid Other | Attending: Internal Medicine | Admitting: Internal Medicine

## 2017-10-23 ENCOUNTER — Ambulatory Visit: Payer: Self-pay

## 2017-10-23 ENCOUNTER — Telehealth: Payer: Self-pay

## 2017-10-23 ENCOUNTER — Emergency Department (HOSPITAL_COMMUNITY): Payer: Medicaid Other

## 2017-10-23 ENCOUNTER — Ambulatory Visit (INDEPENDENT_AMBULATORY_CARE_PROVIDER_SITE_OTHER): Payer: Self-pay | Admitting: Infectious Diseases

## 2017-10-23 ENCOUNTER — Inpatient Hospital Stay: Payer: Self-pay

## 2017-10-23 VITALS — BP 109/79 | HR 137 | Temp 99.0°F | Ht 66.0 in | Wt 107.0 lb

## 2017-10-23 DIAGNOSIS — E874 Mixed disorder of acid-base balance: Secondary | ICD-10-CM | POA: Diagnosis not present

## 2017-10-23 DIAGNOSIS — E861 Hypovolemia: Secondary | ICD-10-CM | POA: Diagnosis present

## 2017-10-23 DIAGNOSIS — E876 Hypokalemia: Secondary | ICD-10-CM | POA: Diagnosis present

## 2017-10-23 DIAGNOSIS — R188 Other ascites: Secondary | ICD-10-CM | POA: Diagnosis present

## 2017-10-23 DIAGNOSIS — R109 Unspecified abdominal pain: Secondary | ICD-10-CM | POA: Diagnosis not present

## 2017-10-23 DIAGNOSIS — R6520 Severe sepsis without septic shock: Secondary | ICD-10-CM | POA: Diagnosis not present

## 2017-10-23 DIAGNOSIS — Z7189 Other specified counseling: Secondary | ICD-10-CM

## 2017-10-23 DIAGNOSIS — Z91199 Patient's noncompliance with other medical treatment and regimen due to unspecified reason: Secondary | ICD-10-CM

## 2017-10-23 DIAGNOSIS — R945 Abnormal results of liver function studies: Secondary | ICD-10-CM

## 2017-10-23 DIAGNOSIS — R197 Diarrhea, unspecified: Secondary | ICD-10-CM | POA: Diagnosis present

## 2017-10-23 DIAGNOSIS — R6510 Systemic inflammatory response syndrome (SIRS) of non-infectious origin without acute organ dysfunction: Secondary | ICD-10-CM | POA: Diagnosis not present

## 2017-10-23 DIAGNOSIS — R339 Retention of urine, unspecified: Secondary | ICD-10-CM | POA: Diagnosis not present

## 2017-10-23 DIAGNOSIS — B20 Human immunodeficiency virus [HIV] disease: Principal | ICD-10-CM | POA: Diagnosis present

## 2017-10-23 DIAGNOSIS — R64 Cachexia: Secondary | ICD-10-CM | POA: Diagnosis present

## 2017-10-23 DIAGNOSIS — R627 Adult failure to thrive: Secondary | ICD-10-CM | POA: Diagnosis present

## 2017-10-23 DIAGNOSIS — R634 Abnormal weight loss: Secondary | ICD-10-CM | POA: Diagnosis not present

## 2017-10-23 DIAGNOSIS — A084 Viral intestinal infection, unspecified: Secondary | ICD-10-CM | POA: Diagnosis not present

## 2017-10-23 DIAGNOSIS — R7989 Other specified abnormal findings of blood chemistry: Secondary | ICD-10-CM | POA: Diagnosis present

## 2017-10-23 DIAGNOSIS — E860 Dehydration: Secondary | ICD-10-CM

## 2017-10-23 DIAGNOSIS — Z21 Asymptomatic human immunodeficiency virus [HIV] infection status: Secondary | ICD-10-CM | POA: Diagnosis not present

## 2017-10-23 DIAGNOSIS — L89152 Pressure ulcer of sacral region, stage 2: Secondary | ICD-10-CM | POA: Diagnosis not present

## 2017-10-23 DIAGNOSIS — Z681 Body mass index (BMI) 19 or less, adult: Secondary | ICD-10-CM

## 2017-10-23 DIAGNOSIS — I959 Hypotension, unspecified: Secondary | ICD-10-CM | POA: Diagnosis not present

## 2017-10-23 DIAGNOSIS — R0600 Dyspnea, unspecified: Secondary | ICD-10-CM

## 2017-10-23 DIAGNOSIS — R4702 Dysphasia: Secondary | ICD-10-CM | POA: Diagnosis present

## 2017-10-23 DIAGNOSIS — R066 Hiccough: Secondary | ICD-10-CM | POA: Diagnosis not present

## 2017-10-23 DIAGNOSIS — J189 Pneumonia, unspecified organism: Secondary | ICD-10-CM | POA: Diagnosis not present

## 2017-10-23 DIAGNOSIS — J80 Acute respiratory distress syndrome: Secondary | ICD-10-CM | POA: Diagnosis not present

## 2017-10-23 DIAGNOSIS — R112 Nausea with vomiting, unspecified: Secondary | ICD-10-CM | POA: Diagnosis not present

## 2017-10-23 DIAGNOSIS — D72810 Lymphocytopenia: Secondary | ICD-10-CM | POA: Diagnosis present

## 2017-10-23 DIAGNOSIS — Z6823 Body mass index (BMI) 23.0-23.9, adult: Secondary | ICD-10-CM | POA: Diagnosis not present

## 2017-10-23 DIAGNOSIS — R338 Other retention of urine: Secondary | ICD-10-CM | POA: Diagnosis not present

## 2017-10-23 DIAGNOSIS — E871 Hypo-osmolality and hyponatremia: Secondary | ICD-10-CM | POA: Diagnosis not present

## 2017-10-23 DIAGNOSIS — Z6824 Body mass index (BMI) 24.0-24.9, adult: Secondary | ICD-10-CM | POA: Diagnosis not present

## 2017-10-23 DIAGNOSIS — D62 Acute posthemorrhagic anemia: Secondary | ICD-10-CM | POA: Diagnosis not present

## 2017-10-23 DIAGNOSIS — Z9889 Other specified postprocedural states: Secondary | ICD-10-CM | POA: Diagnosis not present

## 2017-10-23 DIAGNOSIS — N179 Acute kidney failure, unspecified: Secondary | ICD-10-CM | POA: Diagnosis not present

## 2017-10-23 DIAGNOSIS — E43 Unspecified severe protein-calorie malnutrition: Secondary | ICD-10-CM | POA: Diagnosis present

## 2017-10-23 DIAGNOSIS — R000 Tachycardia, unspecified: Secondary | ICD-10-CM | POA: Diagnosis present

## 2017-10-23 DIAGNOSIS — D638 Anemia in other chronic diseases classified elsewhere: Secondary | ICD-10-CM | POA: Diagnosis present

## 2017-10-23 DIAGNOSIS — K567 Ileus, unspecified: Secondary | ICD-10-CM | POA: Diagnosis not present

## 2017-10-23 DIAGNOSIS — R17 Unspecified jaundice: Secondary | ICD-10-CM | POA: Diagnosis not present

## 2017-10-23 DIAGNOSIS — R1114 Bilious vomiting: Secondary | ICD-10-CM | POA: Diagnosis not present

## 2017-10-23 DIAGNOSIS — L8990 Pressure ulcer of unspecified site, unspecified stage: Secondary | ICD-10-CM | POA: Diagnosis present

## 2017-10-23 DIAGNOSIS — K529 Noninfective gastroenteritis and colitis, unspecified: Secondary | ICD-10-CM | POA: Diagnosis present

## 2017-10-23 DIAGNOSIS — Z9119 Patient's noncompliance with other medical treatment and regimen: Secondary | ICD-10-CM

## 2017-10-23 DIAGNOSIS — Z66 Do not resuscitate: Secondary | ICD-10-CM | POA: Diagnosis not present

## 2017-10-23 DIAGNOSIS — D6959 Other secondary thrombocytopenia: Secondary | ICD-10-CM | POA: Diagnosis present

## 2017-10-23 DIAGNOSIS — D649 Anemia, unspecified: Secondary | ICD-10-CM | POA: Diagnosis not present

## 2017-10-23 DIAGNOSIS — R748 Abnormal levels of other serum enzymes: Secondary | ICD-10-CM | POA: Diagnosis not present

## 2017-10-23 DIAGNOSIS — D696 Thrombocytopenia, unspecified: Secondary | ICD-10-CM | POA: Diagnosis present

## 2017-10-23 DIAGNOSIS — R161 Splenomegaly, not elsewhere classified: Secondary | ICD-10-CM | POA: Diagnosis not present

## 2017-10-23 DIAGNOSIS — A419 Sepsis, unspecified organism: Secondary | ICD-10-CM

## 2017-10-23 DIAGNOSIS — Z515 Encounter for palliative care: Secondary | ICD-10-CM

## 2017-10-23 DIAGNOSIS — D509 Iron deficiency anemia, unspecified: Secondary | ICD-10-CM | POA: Diagnosis not present

## 2017-10-23 DIAGNOSIS — R4182 Altered mental status, unspecified: Secondary | ICD-10-CM | POA: Diagnosis not present

## 2017-10-23 DIAGNOSIS — J181 Lobar pneumonia, unspecified organism: Secondary | ICD-10-CM | POA: Diagnosis not present

## 2017-10-23 DIAGNOSIS — E162 Hypoglycemia, unspecified: Secondary | ICD-10-CM | POA: Diagnosis not present

## 2017-10-23 DIAGNOSIS — J690 Pneumonitis due to inhalation of food and vomit: Secondary | ICD-10-CM | POA: Diagnosis not present

## 2017-10-23 DIAGNOSIS — R601 Generalized edema: Secondary | ICD-10-CM | POA: Diagnosis not present

## 2017-10-23 DIAGNOSIS — R5081 Fever presenting with conditions classified elsewhere: Secondary | ICD-10-CM | POA: Diagnosis not present

## 2017-10-23 DIAGNOSIS — E46 Unspecified protein-calorie malnutrition: Secondary | ICD-10-CM | POA: Diagnosis not present

## 2017-10-23 DIAGNOSIS — Z880 Allergy status to penicillin: Secondary | ICD-10-CM | POA: Diagnosis not present

## 2017-10-23 DIAGNOSIS — Z0189 Encounter for other specified special examinations: Secondary | ICD-10-CM

## 2017-10-23 HISTORY — DX: Anemia in other chronic diseases classified elsewhere: D63.8

## 2017-10-23 LAB — I-STAT CG4 LACTIC ACID, ED
LACTIC ACID, VENOUS: 1.58 mmol/L (ref 0.5–1.9)
Lactic Acid, Venous: 1.5 mmol/L (ref 0.5–1.9)

## 2017-10-23 LAB — COMPREHENSIVE METABOLIC PANEL
ALK PHOS: 141 U/L — AB (ref 38–126)
ALT: 19 U/L (ref 17–63)
ANION GAP: 8 (ref 5–15)
AST: 23 U/L (ref 15–41)
Albumin: 1.5 g/dL — ABNORMAL LOW (ref 3.5–5.0)
BILIRUBIN TOTAL: 1.9 mg/dL — AB (ref 0.3–1.2)
BUN: 28 mg/dL — ABNORMAL HIGH (ref 6–20)
CALCIUM: 7.4 mg/dL — AB (ref 8.9–10.3)
CO2: 15 mmol/L — AB (ref 22–32)
Chloride: 101 mmol/L (ref 101–111)
Creatinine, Ser: 1.24 mg/dL (ref 0.61–1.24)
GFR calc non Af Amer: >60 mL/min (ref >60)
Glucose, Bld: 142 mg/dL — ABNORMAL HIGH (ref 65–99)
Potassium: 3 mmol/L — ABNORMAL LOW (ref 3.5–5.1)
Sodium: 124 mmol/L — ABNORMAL LOW (ref 135–145)
TOTAL PROTEIN: 5.5 g/dL — AB (ref 6.5–8.1)

## 2017-10-23 LAB — CBC WITH DIFFERENTIAL/PLATELET
BLASTS: 0 %
Band Neutrophils: 0 %
Basophils Absolute: 0 10*3/uL (ref 0.0–0.1)
Basophils Relative: 0 %
Eosinophils Absolute: 0.2 10*3/uL (ref 0.0–0.7)
Eosinophils Relative: 2 %
HEMATOCRIT: 25.9 % — AB (ref 39.0–52.0)
HEMOGLOBIN: 9 g/dL — AB (ref 13.0–17.0)
LYMPHS PCT: 4 %
Lymphs Abs: 0.5 10*3/uL — ABNORMAL LOW (ref 0.7–4.0)
MCH: 26.4 pg (ref 26.0–34.0)
MCHC: 34.7 g/dL (ref 30.0–36.0)
MCV: 76 fL — ABNORMAL LOW (ref 78.0–100.0)
Metamyelocytes Relative: 0 %
Monocytes Absolute: 1.4 10*3/uL — ABNORMAL HIGH (ref 0.1–1.0)
Monocytes Relative: 12 %
Myelocytes: 0 %
NEUTROS PCT: 82 %
NRBC: 0 /100{WBCs}
Neutro Abs: 9.9 10*3/uL — ABNORMAL HIGH (ref 1.7–7.7)
OTHER: 0 %
Platelets: 146 10*3/uL — ABNORMAL LOW (ref 150–400)
Promyelocytes Absolute: 0 %
RBC: 3.41 MIL/uL — ABNORMAL LOW (ref 4.22–5.81)
RDW: 18.8 % — ABNORMAL HIGH (ref 11.5–15.5)
WBC: 12 10*3/uL — ABNORMAL HIGH (ref 4.0–10.5)

## 2017-10-23 LAB — CULTURE, BLOOD (ROUTINE X 2)
Culture: NO GROWTH
Culture: NO GROWTH
SPECIAL REQUESTS: ADEQUATE
SPECIAL REQUESTS: ADEQUATE

## 2017-10-23 LAB — C DIFFICILE QUICK SCREEN W PCR REFLEX
C DIFFICILE (CDIFF) TOXIN: NEGATIVE
C DIFFICLE (CDIFF) ANTIGEN: NEGATIVE
C Diff interpretation: NOT DETECTED

## 2017-10-23 LAB — MAGNESIUM: MAGNESIUM: 1.9 mg/dL (ref 1.7–2.4)

## 2017-10-23 MED ORDER — SULFAMETHOXAZOLE-TRIMETHOPRIM 800-160 MG PO TABS: 1.0000 | ORAL_TABLET | Freq: Every day | ORAL | Status: DC

## 2017-10-23 MED ORDER — POTASSIUM CHLORIDE CRYS ER 20 MEQ PO TBCR
40.0000 meq | EXTENDED_RELEASE_TABLET | Freq: Once | ORAL | Status: AC
  Administered 2017-10-23: 40 meq via ORAL
  Filled 2017-10-23: qty 2

## 2017-10-23 MED ORDER — BICTEGRAVIR-EMTRICITAB-TENOFOV 50-200-25 MG PO TABS
1.0000 | ORAL_TABLET | Freq: Every day | ORAL | Status: DC
  Administered 2017-10-23 – 2017-11-05 (×14): 1 via ORAL
  Filled 2017-10-23 (×16): qty 1

## 2017-10-23 MED ORDER — ACETAMINOPHEN 325 MG PO TABS
650.0000 mg | ORAL_TABLET | Freq: Four times a day (QID) | ORAL | Status: DC | PRN
  Administered 2017-10-26 – 2017-11-13 (×9): 650 mg via ORAL
  Filled 2017-10-23 (×9): qty 2

## 2017-10-23 MED ORDER — DEXTROSE-NACL 5-0.45 % IV SOLN: INTRAVENOUS | Status: DC

## 2017-10-23 MED ORDER — MEGESTROL ACETATE 625 MG/5ML PO SUSP: 625.0000 mg | Freq: Every day | ORAL | 3 refills | Status: DC

## 2017-10-23 MED ORDER — ADULT MULTIVITAMIN LIQUID CH
5.0000 mL | Freq: Every day | ORAL | Status: DC
  Administered 2017-10-23 – 2017-11-03 (×12): 5 mL via ORAL
  Filled 2017-10-23 (×12): qty 15

## 2017-10-23 MED ORDER — ACETAMINOPHEN 650 MG RE SUPP
650.0000 mg | Freq: Four times a day (QID) | RECTAL | Status: DC | PRN
Start: 2017-10-23 — End: 2017-11-15

## 2017-10-23 MED ORDER — SODIUM CHLORIDE 0.9% FLUSH
3.0000 mL | Freq: Two times a day (BID) | INTRAVENOUS | Status: DC
  Administered 2017-10-23 – 2017-11-11 (×31): 3 mL via INTRAVENOUS

## 2017-10-23 MED ORDER — BICTEGRAVIR-EMTRICITAB-TENOFOV 50-200-25 MG PO TABS: 1.0000 | ORAL_TABLET | Freq: Every day | ORAL | 5 refills | Status: AC

## 2017-10-23 MED ORDER — TRAZODONE HCL 50 MG PO TABS
50.0000 mg | ORAL_TABLET | Freq: Every evening | ORAL | Status: DC | PRN
  Administered 2017-10-28 – 2017-11-14 (×8): 50 mg via ORAL
  Filled 2017-10-23 (×8): qty 1

## 2017-10-23 MED ORDER — ONDANSETRON HCL 4 MG PO TABS
4.0000 mg | ORAL_TABLET | Freq: Four times a day (QID) | ORAL | Status: DC | PRN
Start: 2017-10-23 — End: 2017-10-30
  Administered 2017-10-27 (×2): 4 mg via ORAL
  Filled 2017-10-23 (×2): qty 1

## 2017-10-23 MED ORDER — MEGESTROL ACETATE 400 MG/10ML PO SUSP
400.0000 mg | Freq: Every day | ORAL | Status: DC
  Administered 2017-10-24 – 2017-11-05 (×13): 400 mg via ORAL
  Filled 2017-10-23 (×14): qty 10

## 2017-10-23 MED ORDER — SODIUM CHLORIDE 0.9 % IV SOLN
INTRAVENOUS | Status: DC
  Administered 2017-10-23 – 2017-10-27 (×6): via INTRAVENOUS

## 2017-10-23 MED ORDER — MEGESTROL ACETATE 625 MG/5ML PO SUSP: 625.0000 mg | Freq: Every day | ORAL | Status: DC

## 2017-10-23 MED ORDER — SODIUM CHLORIDE 0.9% FLUSH
3.0000 mL | INTRAVENOUS | Status: DC | PRN
  Administered 2017-10-31 (×2): 3 mL via INTRAVENOUS
  Filled 2017-10-23 (×2): qty 3

## 2017-10-23 MED ORDER — BOOST / RESOURCE BREEZE PO LIQD
1.0000 | Freq: Three times a day (TID) | ORAL | Status: DC
  Administered 2017-10-23 – 2017-10-28 (×4): 1 via ORAL

## 2017-10-23 MED ORDER — POTASSIUM CHLORIDE CRYS ER 20 MEQ PO TBCR: 40.0000 meq | EXTENDED_RELEASE_TABLET | Freq: Once | ORAL | Status: DC

## 2017-10-23 MED ORDER — SODIUM CHLORIDE 0.9 % IV SOLN
250.0000 mL | INTRAVENOUS | Status: DC | PRN
Start: 2017-10-23 — End: 2017-11-12
  Administered 2017-11-05 – 2017-11-10 (×2): 250 mL via INTRAVENOUS

## 2017-10-23 MED ORDER — METOCLOPRAMIDE HCL 10 MG PO TABS
10.0000 mg | ORAL_TABLET | Freq: Once | ORAL | Status: AC
  Administered 2017-10-23: 10 mg via ORAL
  Filled 2017-10-23: qty 1

## 2017-10-23 MED ORDER — BICTEGRAVIR-EMTRICITAB-TENOFOV 50-200-25 MG PO TABS: 1.0000 | ORAL_TABLET | Freq: Every day | ORAL | Status: DC

## 2017-10-23 MED ORDER — SODIUM CHLORIDE 0.9 % IV BOLUS (SEPSIS)
1000.0000 mL | Freq: Once | INTRAVENOUS | Status: AC
  Administered 2017-10-23: 1000 mL via INTRAVENOUS

## 2017-10-23 MED ORDER — ABACAVIR-DOLUTEGRAVIR-LAMIVUD 600-50-300 MG PO TABS: 1.0000 | ORAL_TABLET | Freq: Every day | ORAL | Status: DC

## 2017-10-23 MED ORDER — ONDANSETRON HCL 4 MG/2ML IJ SOLN
4.0000 mg | Freq: Four times a day (QID) | INTRAMUSCULAR | Status: DC | PRN
  Administered 2017-10-23 – 2017-10-30 (×9): 4 mg via INTRAVENOUS
  Filled 2017-10-23 (×9): qty 2

## 2017-10-23 MED ORDER — MEGESTROL ACETATE 625 MG/5ML PO SUSP: 625.0000 mg | Freq: Every day | ORAL | 3 refills | Status: AC

## 2017-10-23 MED ORDER — ALBUTEROL SULFATE (2.5 MG/3ML) 0.083% IN NEBU: 2.5000 mg | INHALATION_SOLUTION | RESPIRATORY_TRACT | Status: DC | PRN

## 2017-10-23 MED ORDER — SULFAMETHOXAZOLE-TRIMETHOPRIM 800-160 MG PO TABS
1.0000 | ORAL_TABLET | Freq: Every day | ORAL | Status: DC
  Administered 2017-10-24 – 2017-10-29 (×6): 1 via ORAL
  Filled 2017-10-23 (×6): qty 1

## 2017-10-23 MED ORDER — SULFAMETHOXAZOLE-TRIMETHOPRIM 400-80 MG PO TABS: 1.0000 | ORAL_TABLET | Freq: Every day | ORAL | Status: DC

## 2017-10-23 NOTE — Progress Notes (Signed)
[facility] for peripheral IV with 1L bolus NS.  Flashback, but PIV attempt in left forearm unsuccessful. Andree CossHowell, Nola Botkins M, RN

## 2017-10-23 NOTE — Consult Note (Signed)
[facility]    Date of Admission:  2017/10/24         Reason for Consult: HIV/AIDS  Primary Care Provider: Patient, No Pcp Per   Assessment / Plan: 31 y/o male with HIV/AIDS and most recent CD4 count of 12 and viral load of 611,000 with anemia and failure to thrive. Has decreased oral intake complicated by nausea and diarrhea.  HIV/AIDS: Start Biktarvy for ART. Given significant dehydration will start Bactrim once daily tomorrow for PCP prophylaxis.  Diarrhea: Previously treated for Giardia with Flagyl. Continue to GI panel and C. Diff quick screen awaiting collection.  Dehydration/Hyponatremia: Related to decreased oral intake and chronic diarrhea. Replacement per primary team.    Principal Problem:   AIDS (acquired immune deficiency syndrome) (HCC) Active Problems:   Non-compliance   Protein-calorie malnutrition, severe   Diarrhea   Dehydration   Scheduled Meds: . potassium chloride  40 mEq Oral Once  . potassium chloride  40 mEq Oral Once   Continuous Infusions: . sodium chloride 150 mL/hr at Oct 24, 2017 1539   PRN Meds:.  HPI: Anthony Yang is a 31 y.o. male with a PMH of HIV/AIDS that was seen in the clinic earlier today and was sent to the ED for dehydration and failure to thrive. Previously treated for Giardia during a recent hospitalization about month ago. Denies any fevers, chills, or headaches but does continue to experience diarrhea which he relates to his "IBS" with multiple bowel movements per day. Stools are described as watery at times. Oral intake has been very little as family indicates that he "nibbles" here and there with no good meal intake. He does attempt to drink an Ensure/Boost per day. Has lost close to 35 pounds since his most recent hospitalization about 1 month ago. Current blood work with hyponatremia at 124, hypokalemia at 3.0 and anemia at 9.0. Stool studies and blood cultures have been ordered. Started on Megace to  help with appetite. Reports compliance with his HIV regimen of Triumeq with the complaint that the medication is too large and has difficulty swallowing it at times. Plan in the clinic was to change his medication to [facility].   Review of Systems: Review of Systems  Constitutional: Negative for chills and fever.  Cardiovascular: Negative for chest pain.  Gastrointestinal: Positive for diarrhea and nausea. Negative for abdominal pain, blood in stool, constipation, heartburn, melena and vomiting.  Skin: Negative for rash.  Neurological: Positive for weakness. Negative for headaches.    Past Medical History:  Diagnosis Date  . AIDS (acquired immune deficiency syndrome) (HCC)    hx/notes 09/25/2017  . Anemia   . History of blood transfusion 09/25/2017   "anemic"  . HIV (human immunodeficiency virus infection) (HCC)    hx/notes 09/24/2017    Social History  Substance Use Topics  . Smoking status: Never Smoker  . Smokeless tobacco: Never Used  . Alcohol use 1.8 oz/week    3 Shots of liquor per week    No family history on file. Allergies  Allergen Reactions  . Penicillins Hives    Has patient had a PCN reaction causing immediate rash, facial/tongue/throat swelling, SOB or lightheadedness with hypotension: Yes Has patient had a PCN reaction causing severe rash involving mucus membranes or skin necrosis: No Has patient had a PCN reaction that required hospitalization: No Has patient had a PCN reaction occurring within the last 10 years: No If all of the above answers are "NO", then may proceed  with Cephalosporin use.    OBJECTIVE: Blood pressure 98/71, pulse (!) 109, temperature 100.1 F (37.8 C), temperature source Rectal, resp. rate 17, SpO2 100 %.  Physical Exam  Constitutional: He appears malnourished. He appears cachectic.  Cardiovascular: Regular rhythm and intact distal pulses.  Tachycardia present.  Exam reveals no gallop and no friction rub.   No murmur  heard. Pulmonary/Chest: Effort normal and breath sounds normal. No respiratory distress. He has no wheezes. He has no rales. He exhibits no tenderness.  Abdominal: Soft. Bowel sounds are normal. There is no tenderness. There is no rebound.    Lab Results Lab Results  Component Value Date   WBC 12.0 (H) December 11, 2017   HGB 9.0 (L) December 11, 2017   HCT 25.9 (L) December 11, 2017   MCV 76.0 (L) December 11, 2017   PLT 146 (L) December 11, 2017    Lab Results  Component Value Date   CREATININE 1.24 December 11, 2017   BUN 28 (H) December 11, 2017   NA 124 (L) December 11, 2017   K 3.0 (L) December 11, 2017   CL 101 December 11, 2017   CO2 15 (L) December 11, 2017    Lab Results  Component Value Date   ALT 19 December 11, 2017   AST 23 December 11, 2017   ALKPHOS 141 (H) December 11, 2017   BILITOT 1.9 (H) December 11, 2017     Microbiology: Recent Results (from the past 240 hour(s))  Blood culture (routine x 2)     Status: None   Collection Time: 10/18/17  6:00 PM  Result Value Ref Range Status   Specimen Description BLOOD LEFT ANTECUBITAL  Final   Special Requests   Final    BOTTLES DRAWN AEROBIC AND ANAEROBIC Blood Culture adequate volume   Culture NO GROWTH 5 DAYS  Final   Report Status December 11, 2017 FINAL  Final  Blood culture (routine x 2)     Status: None   Collection Time: 10/18/17  6:31 PM  Result Value Ref Range Status   Specimen Description BLOOD RIGHT HAND  Final   Special Requests IN PEDIATRIC BOTTLE Blood Culture adequate volume  Final   Culture NO GROWTH 5 DAYS  Final   Report Status December 11, 2017 FINAL  Final  Urine culture     Status: Abnormal   Collection Time: 10/18/17  7:20 PM  Result Value Ref Range Status   Specimen Description URINE, RANDOM  Final   Special Requests NONE  Final   Culture <10,000 COLONIES/mL (A)  Final   Report Status 10/20/2017 FINAL  Final   Marcos EkeGreg Abem Shaddix, NP [facility] [phone] Pager [phone] Cell   December 11, 2017, 3:55 PM

## 2017-10-23 NOTE — Assessment & Plan Note (Addendum)
Although he reports excellent adherence, he is having trouble swallowing Triumeq. Requesting smaller pill. I don't want to do the Genvoya as he was taking previously with his oral intake being so poor. Will change to Biktarvy. Will check HIV VL with genosure, CD4 count today. Counseled on medication switch and importance of taking regimen. I have discussed with he and his father to please call the clinic and inform our team if he is admitted to the hospital and when he leaves so we can follow up with him outpatient closely. If he is not admitted would like to see him back next week. Dr. Van Dam saw the patient in the hospital and organized D/C planning.   I have introduced him to our clinic counselor Janet today and would like for him to follow up with her after physiologic needs met.  

## 2017-10-23 NOTE — H&P (Signed)
Patient Demographics:    Anthony Yang, is a 31 y.o. male  MRN: [id number]   DOB - June 10, 1986  Admit Date - 10/28/2017  Outpatient Primary MD for the patient is Patient, No Pcp Per   Assessment & Plan:    Principal Problem:   AIDS (acquired immune deficiency syndrome) ([place]) Active Problems:   Non-compliance   Protein-calorie malnutrition, severe   Diarrhea   Dehydration    1)FEN- Hyponatremia/Hypokalemia/Dehydration- suspect secondary to GI losses and poor nutritional intake, hydrate IV and by mouth, replace potassium, get dietary/nutrition consult, stool studies for diarrhea pending, give Megace for appetite stimulation  2)HiV/AIDS- CD4 count was 28 recently presented to  [facility] in September 2018, recently treated for Giardia, infectious disease consult pending, patient would need PCP and MAC prophylaxis, c/n ART as per infectious disease Team  3)Leukocytosis- ??? Reactive, hold off on new/further antibiotic therapy pending blood in stool cultures  4)Anemia-multifactorial, does not meet criteria for transfusion at this time, no evidence of ongoing bleeding, monitor closely and transfuse as clinically indicated  With History of - Reviewed by me  Past Medical History:  Diagnosis Date  . AIDS (acquired immune deficiency syndrome) ([place])    hx/notes 09/25/2017  . Anemia   . History of blood transfusion 09/25/2017   "anemic"  . HIV (human immunodeficiency virus infection) ([place])    hx/notes 09/24/2017      Past Surgical History:  Procedure Laterality Date  . FRACTURE SURGERY    . ORIF FINGER / THUMB FRACTURE Right   . WRIST FRACTURE SURGERY Left       Chief Complaint  Patient presents with  . weakness/tachy      HPI:    Anthony Yang  is a 31 y.o. male with past medical history relevant for  HIV/AIDs (CD4 count was 28) recently presented to  [facility] in September 2018 after  being off ART x 2 years with 2 month (originally diagnosed with HIV in 2013 when he was being treated for syphilis) who now presents to the ED from the HIV/infectious disease clinic with concerns about persistent diarrhea and dehydration. Patient has a history of about 50 pound weight loss over the last 6 months or so. Recently treated for diarrhea (+ for Giardia and tx with Flagyl)   He was started on Triumeq in September 2018 during his recent inpatient stay, was also started on  Bactrim and Fluconazole. 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 Diarrhea appears to be chronic, however has become more loose and more frequent over the last few days.   No fevers or chills, no significant abdominal pain, no dysuria, no productive cough,    Review of systems:    In addition to the HPI above,   A full 12 point Review of  10 Systems was done, except as stated above, all other Review of 10 Systems were negative.    Social History:  Reviewed by me   Social History  Substance Use Topics  . Smoking status: Never Smoker  . Smokeless tobacco: Never Used  . Alcohol use 1.8 oz/week    3 Shots of liquor per week     Family History :  Reviewed by me   No family history on file. Reviewed with father at bedside, noncontributory   Home Medications:   Prior to Admission medications   Medication Sig Start Date End Date Taking? Authorizing Provider  feeding supplement (BOOST / RESOURCE BREEZE) LIQD Take 1 Container by mouth 3 (three) times daily between meals. Patient taking differently: Take 237 mLs by mouth daily.  10/02/17  Yes [name], [place], DO  Multiple Vitamin (MULTIVITAMIN) LIQD Take 5 mLs by mouth daily. TLC life changes/  Nutriburst   Yes [provider]  sulfamethoxazole-trimethoprim (BACTRIM,SEPTRA) 400-80 MG tablet Take 1 tablet by mouth daily. Patient taking differently: Take 1 tablet by mouth daily. 30 day course filled 10/03/17 10/03/17  Yes [name], [name], DO  TRIUMEQ 600-50-300 MG tablet Take 1 tablet by mouth daily. 10/01/17  Yes [provider]  bictegravir-emtricitabine-tenofovir AF (BIKTARVY) 50-200-25 MG TABS tablet Take 1 tablet by mouth daily. Try to take at the same time each day with or without food. 10/25/2017   Bouton Callas, NP  megestrol (MEGACE ES) 625 MG/5ML suspension Take 5 mLs (625 mg total) by mouth daily. 10/18/2017   Stuart Callas, NP  pantoprazole (PROTONIX) 40 MG tablet Take 1 tablet (40 mg total) by mouth 2 (two) times daily. Patient not taking: Reported on 10/18/2017 10/02/17   Cristal Ford, DO     Allergies:     Allergies  Allergen Reactions  . Penicillins Hives    Has patient had a PCN reaction causing immediate rash, facial/tongue/throat swelling, SOB or lightheadedness with hypotension: Yes Has patient had a PCN reaction causing severe rash involving mucus membranes or skin necrosis: No Has patient had a PCN reaction that required hospitalization: No Has patient had a PCN reaction occurring within the last 10 years: No If all of the above answers are "NO", then may proceed with Cephalosporin use.     Physical Exam:   Vitals  Blood pressure 93/68, pulse (!) 119, temperature 100.1 F (37.8 C), temperature source Rectal, resp. rate 13, SpO2 100 %.  Physical Examination: General appearance - alert, chronically and cachectic appearing, and in no distress Mental status - alert, oriented to person, place, and time,  Eyes - sclera anicteric Neck - supple, no JVD elevation , Chest - clear  to auscultation bilaterally, symmetrical air movement,  Heart - S1 and S2 normal, heart rate 120 Abdomen - soft, nontender, nondistended, no CVA  tenderness Neurological - screening mental status exam normal, neck supple without rigidity, cranial nerves II through XII intact, DTR's normal and symmetric, generalized weakness without new focal deficits Extremities - no pedal edema noted, intact peripheral pulses  Skin - warm, dry, also dry oral mucosa Psych- very flat affect, denies suicidal or homicidal ideation or plan   Data Review:    CBC  Recent Labs Lab 10/18/17 1531 10/02/2017 1138  WBC 10.3 12.0*  HGB 10.7* 9.0*  HCT 31.3* 25.9*  PLT 180 146*  MCV 78.8 76.0*  MCH 27.0 26.4  MCHC 34.2 34.7  RDW 18.7* 18.8*  LYMPHSABS 1.5 0.5*  MONOABS 2.6* 1.4*  EOSABS 0.0 0.2  BASOSABS 0.0 0.0   ------------------------------------------------------------------------------------------------------------------  Chemistries   Recent Labs Lab 10/18/17 1531 10/18/17 1800 10/17/2017 1138  NA 124*  --  124*  K 4.2  --  3.0*  CL 95*  --  101  CO2 19*  --  15*  GLUCOSE 134*  --  142*  BUN 18  --  28*  CREATININE 0.92  --  1.24  CALCIUM 7.8*  --  7.4*  MG  --  1.9  --   AST 30  --  23  ALT 21  --  19  ALKPHOS 110  --  141*  BILITOT 1.4*  --  1.9*   ------------------------------------------------------------------------------------------------------------------ estimated creatinine clearance is 59.2 mL/min (by C-G formula based on SCr of 1.24 mg/dL). ------------------------------------------------------------------------------------------------------------------ No results for input(s): TSH, T4TOTAL, T3FREE, THYROIDAB in the last 72 hours.  Invalid input(s): FREET3   Coagulation profile  Recent Labs Lab 10/18/17 1800  INR 1.31   ------------------------------------------------------------------------------------------------------------------- No results for input(s): DDIMER in the last 72  hours. -------------------------------------------------------------------------------------------------------------------  Cardiac Enzymes No results for input(s): CKMB, TROPONINI, MYOGLOBIN in the last 168 hours.  Invalid input(s): CK ------------------------------------------------------------------------------------------------------------------ No results found for: BNP  ---------------------------------------------------------------------------------------------------------------  Urinalysis    Component Value Date/Time   COLORURINE AMBER (A) 10/18/2017 1920   APPEARANCEUR HAZY (A) 10/18/2017 1920   LABSPEC 1.038 (H) 10/18/2017 1920   PHURINE 6.0 10/18/2017 1920   GLUCOSEU NEGATIVE 10/18/2017 1920   HGBUR NEGATIVE 10/18/2017 1920   BILIRUBINUR NEGATIVE 10/18/2017 1920   KETONESUR NEGATIVE 10/18/2017 1920   PROTEINUR 100 (A) 10/18/2017 1920   NITRITE NEGATIVE 10/18/2017 1920   LEUKOCYTESUR NEGATIVE 10/18/2017 1920    ----------------------------------------------------------------------------------------------------------------   Imaging Results:    Dg Chest 2 View  Result Date: 10/22/2017 CLINICAL DATA:  Weakness and dehydration.  HIV disease EXAM: CHEST  2 VIEW COMPARISON:  October 18, 2017 FINDINGS: Lungs are clear. Heart size and pulmonary vascularity are normal. No adenopathy. No bone lesions. No pneumothorax. IMPRESSION: No edema or consolidation. Electronically Signed   By: Lowella Grip III M.D.   On: 10/06/2017 12:25    Radiological Exams on Admission: Dg Chest 2 View  Result Date: 09/30/2017 CLINICAL DATA:  Weakness and dehydration.  HIV disease EXAM: CHEST  2 VIEW COMPARISON:  October 18, 2017 FINDINGS: Lungs are clear. Heart size and pulmonary vascularity are normal. No adenopathy. No bone lesions. No pneumothorax. IMPRESSION: No edema or consolidation. Electronically Signed   By: Lowella Grip III M.D.   On: 10/22/2017 12:25    DVT Prophylaxis  -SCD   AM Labs Ordered, also please review Full Orders  Family Communication: Admission, patients condition and plan of care including tests being ordered have been discussed with the patient and father who indicate understanding and agree with the plan   Code Status - Full Code  Likely DC to  home  Condition   stable  Anthony Yang M.D on 10/29/2017 at 3:15 PM   Between 7am to 7pm - Pager - [phone] After 7pm go to [email or web address] - password [email or web address]  [facility] - Office  [phone]  Voice Recognition Viviann Spare dictation system was used to create this note, attempts have been made to correct errors. Please contact the author with questions and/or clarifications.

## 2017-10-23 NOTE — Progress Notes (Signed)
Anthony Yang  [id number] 08-28-1986  PCP - Patient, No Pcp Per   Brief Narrative: Anthony Yang is a HIV + AA male patient that our team met during inpatient admission @ [facility] 09/24/2017. Presented being off ART x 2 years with 2 month h/o weight loss, fevers, thrush, diarrhea (+ for Giardia and tx with Flagyl), and AIDS (CD4 28). Originally diagnosed with HIV in 2013 when he was dx with syphilis. He was previously in care at [facility] Previous regimens include: Atripla >> Genvoya. History of OIs: thrush, Giardia intestinal infection recently. High Risk HIV: MSM.   He was initially found to be profoundly anemic with Hgb < 6 and +FOBT. He was transfused. 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 He was started on ethambutol/azithro during admission but after bone marrow biopsy negative for AFB we D/C'd therapy. Later found that he was + Giardia and was treated appropriately with Flagyl. CBC's were monitored and he required multiple transfusions. He was started on Triumeq as it was the most readily available medication we could get delivered prior to D/C home. Also started on Bactrim and Fluconazole. He was discharged home with his medications in the care of his father and mother (of whom recently learned of his infection).   He presented to the ED 10/20 with worsening weight loss and lethargy. His father is very concerned about him today and apparently had trouble getting him in for a visit (from phone notes I believe Anthony Yang was the one that was holding off on calling the clinic back for appointment).   Reports he has been taking his Triumeq every day and has not missed a dose. Does not like the pill size and would like to go back on Genvoya or smaller option. He has continued to lose weight, although cannot tell me how much. He has only been consuming one Boost per day and eating/drinking very little. Feels very weak, tired and cold.  Wants to lay down in the office and sleep. Reports continued watery/green diarrhea but this is his "IBS" flaring up. He has greater than 5 but less than 10 stools estimated a day. Father had to clean up incontinent urine/stool this morning as he was too week to get out of bed. He denies fevers, SOB, headaches, vision changes, sore throat.    Patient Active Problem List   Diagnosis Date Noted  . Diarrhea 10/01/2017  . Dehydration 10/27/2017  . Human immunodeficiency virus (HIV) disease ([place])   . Dyspnea   . Thrush   . Protein-calorie malnutrition, severe 09/27/2017  . Sepsis, unspecified organism (Anthony Yang) 09/25/2017  . Symptomatic anemia 09/25/2017  . Hyponatremia 09/25/2017  . History of GI bleed 09/25/2017  . AIDS (acquired immune deficiency syndrome) (Anthony Yang) 09/25/2017  . Non-compliance 09/25/2017  . Anemia     Patient's Medications  New Prescriptions   BICTEGRAVIR-EMTRICITABINE-TENOFOVIR AF (BIKTARVY) 50-200-25 MG TABS TABLET    Take 1 tablet by mouth daily. Try to take at the same time each day with or without food.   MEGESTROL (MEGACE ES) 625 MG/5ML SUSPENSION    Take 5 mLs (625 mg total) by mouth daily.  Previous Medications   FEEDING SUPPLEMENT (BOOST / RESOURCE BREEZE) LIQD    Take 1 Container by mouth 3 (three) times daily between meals.   MULTIPLE VITAMIN (MULTIVITAMIN) LIQD    Take 5 mLs by mouth daily. TLC life changes/ Nutriburst   PANTOPRAZOLE (PROTONIX) 40 MG  TABLET    Take 1 tablet (40 mg total) by mouth 2 (two) times daily.   SULFAMETHOXAZOLE-TRIMETHOPRIM (BACTRIM,SEPTRA) 400-80 MG TABLET    Take 1 tablet by mouth daily.   TRIUMEQ 600-50-300 MG TABLET    Take 1 tablet by mouth daily.  Modified Medications   No medications on file  Discontinued Medications   ABACAVIR-DOLUTEGRAVIR-LAMIVUDINE (TRIUMEQ) 600-50-300 MG TABLET    Take 1 tablet by mouth daily.    Subjective: Anthony Yang presents to clinic today for routine follow up care for his HIV infection.  HPI:    HIV =  Missed 1 - 2 doses   Diarrhea = tells me he has had liquid diarrhea since before admission   Weight Loss = 107 lbs   Health Maintenance = Anthony Yang reports occasional exercise and receives annual preventative care through   Review of Systems: Review of Systems  All other systems reviewed and are negative. 10-point ROS reviewed  Past Medical History:  Diagnosis Date  . AIDS (acquired immune deficiency syndrome) ([place])    hx/notes 09/25/2017  . Anemia   . History of blood transfusion 09/25/2017   "anemic"  . HIV (human immunodeficiency virus infection) ([place])    hx/notes 09/24/2017    Social History  Substance Use Topics  . Smoking status: Never Smoker  . Smokeless tobacco: Never Used  . Alcohol use 1.8 oz/week    3 Shots of liquor per week    No family history on file.  Allergies  Allergen Reactions  . Penicillins Hives    Has patient had a PCN reaction causing immediate rash, facial/tongue/throat swelling, SOB or lightheadedness with hypotension: Yes Has patient had a PCN reaction causing severe rash involving mucus membranes or skin necrosis: No Has patient had a PCN reaction that required hospitalization: No Has patient had a PCN reaction occurring within the last 10 years: No If all of the above answers are "NO", then may proceed with Cephalosporin use.    Objective:  Vitals:   10/26/2017 0909  BP: 109/79  Pulse: (!) 137  Temp: 99 F (37.2 C)  Weight: 107 lb (48.5 kg)  Height: _0  (1.676 m)   Body mass index is 17.27 kg/m.  Physical Exam  Constitutional: He is oriented to person, place, and time. He appears malnourished and dehydrated. He appears cachectic.  HENT:  Mouth/Throat: Oropharyngeal exudate present.  Sunken eyes. Small amt of thrush present   Eyes: No scleral icterus.  Cardiovascular: Regular rhythm, normal heart sounds and normal pulses.  Tachycardia present.   Abdominal: Soft. Bowel sounds are normal. He exhibits no distension.  There is no tenderness.  Lymphadenopathy:    He has no cervical adenopathy.  Neurological: He is alert and oriented to person, place, and time. He displays weakness.  Skin: Skin is dry. No rash noted.  Psychiatric: He exhibits a depressed mood. He is apathetic. He has a flat affect.    Lab Results Lab Results  Component Value Date   WBC 12.0 (H) 10/05/2017   HGB 9.0 (L) 10/05/2017   HCT 25.9 (L) 10/03/2017   MCV 76.0 (L) 10/02/2017   PLT 146 (L) 10/07/2017    Lab Results  Component Value Date   CREATININE 1.24 09/30/2017   BUN 28 (H) 09/29/2017   NA 124 (L) 10/19/2017   K 3.0 (L) 10/09/2017   CL 101 10/05/2017   CO2 15 (L) 10/25/2017    Lab Results  Component Value Date   ALT 19 10/13/2017  AST 23 10/01/2017   ALKPHOS 141 (H) 10/17/2017   BILITOT 1.9 (H) 10/29/2017    No results found for: CHOL, HDL, LDLCALC, LDLDIRECT, TRIG, CHOLHDL HIV 1 RNA Quant (copies/mL)  Date Value  09/25/2017 604,000   CD4 T Cell Abs (/uL)  Date Value  09/25/2017 30 (L)   No results found for: HAV Lab Results  Component Value Date   HEPBSAG Negative 09/25/2017   Lab Results  Component Value Date   HCVAB <0.1 09/25/2017   No results found for: CHLAMYDIAWP, N No results found for: GCPROBEAPT No results found for: QUANTGOLD No results found for: RPR    Problem List Items Addressed This Visit      Other   AIDS (acquired immune deficiency syndrome) ([place]) - Primary    Although he reports excellent adherence, he is having trouble swallowing Triumeq. Requesting smaller pill. I don't want to do the Genvoya as he was taking previously with his oral intake being so poor. Will change to Biktarvy. Will check HIV VL with genosure, CD4 count today. Counseled on medication switch and importance of taking regimen. I have discussed with he and his father to please call the clinic and inform our team if he is admitted to the hospital and when he leaves so we can follow up with him outpatient  closely. If he is not admitted would like to see him back next week. Dr. Tommy Medal saw the patient in the hospital and organized D/C planning.   I have introduced him to our clinic counselor Marcie Bal today and would like for him to follow up with her after physiologic needs met.       Relevant Medications   bictegravir-emtricitabine-tenofovir AF (BIKTARVY) 50-200-25 MG TABS tablet   Other Relevant Orders   Stool Culture   Dehydration    Attempted to give IVF today in clinic to help however unable to threat PIV site d/t dehydration. I have instructed him to go to ED for evaluation. He may be anemic as he was previously with Hgb ~5.5 last admission. He does not report BRBPR although I don't believe he is really able to participate in his care much to know.       Diarrhea    Will repeat stool culture. He is at high risk for recurrent/under treated cause of infectious diarrhea with his poor immune system and may require prolonged treatment.       Relevant Orders   Stool Culture   Protein-calorie malnutrition, severe    He has lost a significant amount of weight even since hospitalization to the point I almost did not recognize him. He is cachetic and extremely malnourished. I have sent him to ER for evaluation essentially for failure to thrive. He is only consuming 1 boost supplement a day and minimal other calories per his father's account. I will start him on Megace for appetite stimulant. Advised that nutritional supplements are meant to be in addition to meals not meal replacements and we need to get him some nutrition. I believe he may even be to the point where he needs short term enteral feeds.        Other Visit Diagnoses    Weight loss       Relevant Medications   megestrol (MEGACE ES) 625 MG/5ML suspension      Janene Madeira, MSN, NP-C [facility] [place] Pager: [phone]  10/20/2017 5:14 PM

## 2017-10-23 NOTE — ED Triage Notes (Signed)
Patient complains of increased weakness and sent from infectious disease clinic for HR 140-150. Denies pain but appears SOB with talking. States he has persistent diarrhea due to IBS

## 2017-10-23 NOTE — Telephone Encounter (Signed)
Patient's father has requested we use his cell number for sharing information related to patient care.  He states we have been calling the patient with instructions and he does not remember.  [phone]) The patient has given verbal consent to share information with his parents who are proving his care at this time due to his inability to care for himself.    Laurell Josephsammy K King, RN

## 2017-10-23 NOTE — Progress Notes (Signed)
Pt complaints of hiccups. Reglan 10 mg po once given.

## 2017-10-23 NOTE — Assessment & Plan Note (Signed)
He has lost a significant amount of weight even since hospitalization to the point I almost did not recognize him. He is cachetic and extremely malnourished. I have sent him to ER for evaluation essentially for failure to thrive. He is only consuming 1 boost supplement a day and minimal other calories per his father's account. I will start him on Megace for appetite stimulant. Advised that nutritional supplements are meant to be in addition to meals not meal replacements and we need to get him some nutrition. I believe he may even be to the point where he needs short term enteral feeds.

## 2017-10-23 NOTE — ED Provider Notes (Signed)
[facility] Provider Note   CSN: [id number] Arrival date & time: March 15, 2017  1117     History   Chief Complaint Chief Complaint  Patient presents with  . weakness/tachy    HPI Anthony FriendsMarcus L Yang is a 31 y.o. male.  HPI  31 year old male with a history of AIDS, CD4 count 30 in September presents with weakness and dehydration.  He went to his infectious disease appointment today that was previously regularly scheduled and was sent here for dehydration.  Patient states that he does feel dehydrated and overall weak.  Has been feeling this way for the last few days.  Was in the ER on 10/20.  And discharged home after fluids.  He states that he drinks fluids but does not eat because there is no appetite and the food does not taste good.  There is no pain with eating.  No nausea or vomiting.  He denies fevers, headache, chest pain, cough, shortness of breath, abdominal pain, urinary symptoms.  When asked about diarrhea he states that he normally has diarrhea from "IBS".  Over the last few days he has had increased stools with some green mucus.  No blood.  Past Medical History:  Diagnosis Date  . AIDS (acquired immune deficiency syndrome) (HCC)    hx/notes 09/25/2017  . Anemia   . History of blood transfusion 09/25/2017   "anemic"  . HIV (human immunodeficiency virus infection) (HCC)    hx/notes 09/24/2017    Patient Active Problem List   Diagnosis Date Noted  . Human immunodeficiency virus (HIV) disease (HCC)   . Giardia   . Dyspnea   . Thrush   . Protein-calorie malnutrition, severe 09/27/2017  . Sepsis, unspecified organism (HCC) 09/25/2017  . Symptomatic anemia 09/25/2017  . Hyponatremia 09/25/2017  . GI bleeding 09/25/2017  . AIDS (acquired immune deficiency syndrome) (HCC) 09/25/2017  . Non-compliance 09/25/2017  . Anemia   . Fever     Past Surgical History:  Procedure Laterality Date  . FRACTURE SURGERY    . ORIF FINGER / THUMB  FRACTURE Right   . WRIST FRACTURE SURGERY Left        Home Medications    Prior to Admission medications   Medication Sig Start Date End Date Taking? Authorizing Provider  feeding supplement (BOOST / RESOURCE BREEZE) LIQD Take 1 Container by mouth 3 (three) times daily between meals. Patient taking differently: Take 237 mLs by mouth daily.  10/02/17  Yes [name], [name], DO  Multiple Vitamin (MULTIVITAMIN) LIQD Take 5 mLs by mouth daily. TLC life changes/ Nutriburst   Yes [provider]  sulfamethoxazole-trimethoprim (BACTRIM,SEPTRA) 400-80 MG tablet Take 1 tablet by mouth daily. Patient taking differently: Take 1 tablet by mouth daily. 30 day course filled 10/03/17 10/03/17  Yes [name], [name], DO  TRIUMEQ 600-50-300 MG tablet Take 1 tablet by mouth daily. 10/01/17  Yes [provider]  bictegravir-emtricitabine-tenofovir AF (BIKTARVY) 50-200-25 MG TABS tablet Take 1 tablet by mouth daily. Try to take at the same time each day with or without food. March 15, 2017   [name], [name], NP  megestrol (MEGACE ES) 625 MG/5ML suspension Take 5 mLs (625 mg total) by mouth daily. March 15, 2017   [name], [name], NP  pantoprazole (PROTONIX) 40 MG tablet Take 1 tablet (40 mg total) by mouth 2 (two) times daily. Patient not taking: Reported on 10/18/2017 10/02/17   [name], [name], DO    Family History No family history on file.  Social History Social History  Substance Use Topics  . Smoking status: Never Smoker  . Smokeless tobacco: Never Used  . Alcohol use 1.8 oz/week    3 Shots of liquor per week     Allergies   Penicillins   Review of Systems Review of Systems  Constitutional: Positive for fatigue. Negative for fever.  HENT: Negative for congestion.   Respiratory: Negative for cough and shortness of breath.   Cardiovascular: Negative for chest pain.  Gastrointestinal: Positive for diarrhea. Negative for abdominal pain, blood in stool and vomiting.  Genitourinary:  Negative for dysuria.  Musculoskeletal: Negative for back pain.  Neurological: Positive for weakness. Negative for dizziness, numbness and headaches.  All other systems reviewed and are negative.    Physical Exam Updated Vital Signs BP 93/68   Pulse (!) 119   Temp 100.1 F (37.8 C) (Rectal)   Resp 13   SpO2 100%   Physical Exam  Constitutional: He is oriented to person, place, and time. He appears well-developed. He appears cachectic.  HENT:  Head: Normocephalic and atraumatic.  Right Ear: External ear normal.  Left Ear: External ear normal.  Nose: Nose normal.  Mouth/Throat: Mucous membranes are dry.  Mild thrush  Eyes: Pupils are equal, round, and reactive to light. EOM are normal. Right eye exhibits no discharge. Left eye exhibits no discharge.  Neck: Neck supple.  Cardiovascular: Regular rhythm and normal heart sounds.  Tachycardia present.   Pulmonary/Chest: Effort normal and breath sounds normal.  Abdominal: Soft. He exhibits no distension. There is no tenderness.  Musculoskeletal: He exhibits no edema.  Neurological: He is alert and oriented to person, place, and time.  Skin: Skin is warm and dry.  Nursing note and vitals reviewed.    ED Treatments / Results  Labs (all labs ordered are listed, but only abnormal results are displayed) Labs Reviewed  COMPREHENSIVE METABOLIC PANEL - Abnormal; Notable for the following:       Result Value   Sodium 124 (*)    Potassium 3.0 (*)    CO2 15 (*)    Glucose, Bld 142 (*)    BUN 28 (*)    Calcium 7.4 (*)    Total Protein 5.5 (*)    Albumin 1.5 (*)    Alkaline Phosphatase 141 (*)    Total Bilirubin 1.9 (*)    All other components within normal limits  CBC WITH DIFFERENTIAL/PLATELET - Abnormal; Notable for the following:    WBC 12.0 (*)    RBC 3.41 (*)    Hemoglobin 9.0 (*)    HCT 25.9 (*)    MCV 76.0 (*)    RDW 18.8 (*)    Platelets 146 (*)    Neutro Abs 9.9 (*)    Lymphs Abs 0.5 (*)    Monocytes Absolute 1.4  (*)    All other components within normal limits  CULTURE, BLOOD (ROUTINE X 2)  CULTURE, BLOOD (ROUTINE X 2)  GASTROINTESTINAL PANEL BY PCR, STOOL (REPLACES STOOL CULTURE)  URINALYSIS, ROUTINE W REFLEX MICROSCOPIC  I-STAT CG4 LACTIC ACID, ED  I-STAT CG4 LACTIC ACID, ED    EKG  EKG Interpretation  Date/Time:  Thursday 2017-10-26 13:21:37 EDT Ventricular Rate:  115 PR Interval:    QRS Duration: 76 QT Interval:  301 QTC Calculation: 417 R Axis:   84 Text Interpretation:  Sinus tachycardia Abnormal T, consider ischemia, anterior leads no significant change since Oct 18 2017 Reconfirmed by Pricilla Loveless [phone]) on Oct 26, 2017 1:28:36 PM Also confirmed by Pricilla Loveless [phone]),  editor Misty Stanley [phone])  on 11-07-2017 2:21:48 PM       Radiology Dg Chest 2 View  Result Date: 11/07/2017 CLINICAL DATA:  Weakness and dehydration.  HIV disease EXAM: CHEST  2 VIEW COMPARISON:  October 18, 2017 FINDINGS: Lungs are clear. Heart size and pulmonary vascularity are normal. No adenopathy. No bone lesions. No pneumothorax. IMPRESSION: No edema or consolidation. Electronically Signed   By: Bretta Bang III M.D.   On: 11-07-17 12:25    Procedures Procedures (including critical care time)  Medications Ordered in ED Medications  sodium chloride 0.9 % bolus 1,000 mL (0 mLs Intravenous Stopped 11/07/2017 1444)     Initial Impression / Assessment and Plan / ED Course  I have reviewed the triage vital signs and the nursing notes.  Pertinent labs & imaging results that were available during my care of the patient were reviewed by me and considered in my medical decision making (see chart for details).     Patient is in no distress but has significant tachycardia.  No hypotension.  I think the tachycardia is coming from dehydration from both poor oral intake and diarrhea.  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  He has no fever  over 38 Celsius and does not appear to have an acute bacterial infection.  Given his AIDS status, will draw blood cultures, get urine culture, and stool cultures.  I discussed with infectious disease, Dr. Luciana Axe, who agrees with this workup and agrees with admission to hospitalist service and will need colonoscopy for further GI evaluation.  Admit.  Final Clinical Impressions(s) / ED Diagnoses   Final diagnoses:  Dehydration  Hyponatremia  Hypokalemia    New Prescriptions New Prescriptions   No medications on file     Pricilla Loveless, MD 07-Nov-2017 1453

## 2017-10-23 NOTE — ED Notes (Signed)
Patient transported to X-ray 

## 2017-10-23 NOTE — Assessment & Plan Note (Signed)
Will repeat stool culture. He is at high risk for recurrent/under treated cause of infectious diarrhea with his poor immune system and may require prolonged treatment.

## 2017-10-23 NOTE — Assessment & Plan Note (Signed)
Attempted to give IVF today in clinic to help however unable to threat PIV site d/t dehydration. I have instructed him to go to ED for evaluation. He may be anemic as he was previously with Hgb ~5.5 last admission. He does not report BRBPR although I don't believe he is really able to participate in his care much to know.

## 2017-10-24 DIAGNOSIS — D696 Thrombocytopenia, unspecified: Secondary | ICD-10-CM

## 2017-10-24 DIAGNOSIS — R197 Diarrhea, unspecified: Secondary | ICD-10-CM

## 2017-10-24 DIAGNOSIS — K529 Noninfective gastroenteritis and colitis, unspecified: Secondary | ICD-10-CM | POA: Diagnosis present

## 2017-10-24 DIAGNOSIS — D509 Iron deficiency anemia, unspecified: Secondary | ICD-10-CM

## 2017-10-24 LAB — CBC
HEMATOCRIT: 18.7 % — AB (ref 39.0–52.0)
HEMOGLOBIN: 6.5 g/dL — AB (ref 13.0–17.0)
MCH: 26.3 pg (ref 26.0–34.0)
MCHC: 34.8 g/dL (ref 30.0–36.0)
MCV: 75.7 fL — AB (ref 78.0–100.0)
PLATELETS: 120 10*3/uL — AB (ref 150–400)
RBC: 2.47 MIL/uL — AB (ref 4.22–5.81)
RDW: 18.7 % — ABNORMAL HIGH (ref 11.5–15.5)
WBC: 8.4 10*3/uL (ref 4.0–10.5)

## 2017-10-24 LAB — GASTROINTESTINAL PANEL BY PCR, STOOL (REPLACES STOOL CULTURE)
Adenovirus F40/41: NOT DETECTED
Astrovirus: NOT DETECTED
Campylobacter species: NOT DETECTED
Cryptosporidium: NOT DETECTED
Cyclospora cayetanensis: NOT DETECTED
ENTEROAGGREGATIVE E COLI (EAEC): NOT DETECTED
ENTEROPATHOGENIC E COLI (EPEC): NOT DETECTED
ENTEROTOXIGENIC E COLI (ETEC): NOT DETECTED
Entamoeba histolytica: NOT DETECTED
GIARDIA LAMBLIA: NOT DETECTED
NOROVIRUS GI/GII: NOT DETECTED
Plesimonas shigelloides: NOT DETECTED
ROTAVIRUS A: NOT DETECTED
SALMONELLA SPECIES: NOT DETECTED
SHIGELLA/ENTEROINVASIVE E COLI (EIEC): NOT DETECTED
Sapovirus (I, II, IV, and V): NOT DETECTED
Shiga like toxin producing E coli (STEC): NOT DETECTED
Vibrio cholerae: NOT DETECTED
Vibrio species: NOT DETECTED
Yersinia enterocolitica: NOT DETECTED

## 2017-10-24 LAB — URINALYSIS, ROUTINE W REFLEX MICROSCOPIC
Bacteria, UA: NONE SEEN
Bilirubin Urine: NEGATIVE
Glucose, UA: NEGATIVE mg/dL
HGB URINE DIPSTICK: NEGATIVE
Ketones, ur: NEGATIVE mg/dL
Leukocytes, UA: NEGATIVE
NITRITE: NEGATIVE
PH: 6 (ref 5.0–8.0)
Protein, ur: 30 mg/dL — AB
SPECIFIC GRAVITY, URINE: 1.02 (ref 1.005–1.030)
Squamous Epithelial / LPF: NONE SEEN

## 2017-10-24 LAB — BASIC METABOLIC PANEL
ANION GAP: 6 (ref 5–15)
BUN: 23 mg/dL — ABNORMAL HIGH (ref 6–20)
CHLORIDE: 106 mmol/L (ref 101–111)
CO2: 15 mmol/L — ABNORMAL LOW (ref 22–32)
CREATININE: 0.96 mg/dL (ref 0.61–1.24)
Calcium: 6.7 mg/dL — ABNORMAL LOW (ref 8.9–10.3)
GFR calc non Af Amer: >60 mL/min (ref >60)
Glucose, Bld: 115 mg/dL — ABNORMAL HIGH (ref 65–99)
POTASSIUM: 2.9 mmol/L — AB (ref 3.5–5.1)
SODIUM: 127 mmol/L — AB (ref 135–145)

## 2017-10-24 LAB — OCCULT BLOOD X 1 CARD TO LAB, STOOL: Fecal Occult Bld: POSITIVE — AB

## 2017-10-24 LAB — PREPARE RBC (CROSSMATCH)

## 2017-10-24 MED ORDER — METOCLOPRAMIDE HCL 10 MG PO TABS
10.0000 mg | ORAL_TABLET | Freq: Once | ORAL | Status: AC
  Administered 2017-10-24: 10 mg via ORAL
  Filled 2017-10-24: qty 1

## 2017-10-24 MED ORDER — POTASSIUM CHLORIDE CRYS ER 20 MEQ PO TBCR
40.0000 meq | EXTENDED_RELEASE_TABLET | Freq: Once | ORAL | Status: AC
  Administered 2017-10-24: 40 meq via ORAL
  Filled 2017-10-24: qty 2

## 2017-10-24 MED ORDER — BACLOFEN 10 MG PO TABS
10.0000 mg | ORAL_TABLET | Freq: Three times a day (TID) | ORAL | Status: DC | PRN
  Administered 2017-10-24 – 2017-11-04 (×9): 10 mg via ORAL
  Filled 2017-10-24 (×12): qty 1

## 2017-10-24 MED ORDER — SODIUM CHLORIDE 0.9 % IV SOLN: Freq: Once | INTRAVENOUS | Status: DC

## 2017-10-24 MED ORDER — PRO-STAT SUGAR FREE PO LIQD
30.0000 mL | Freq: Three times a day (TID) | ORAL | Status: DC
  Administered 2017-10-24 – 2017-10-28 (×10): 30 mL via ORAL
  Filled 2017-10-24 (×9): qty 30

## 2017-10-24 NOTE — Consult Note (Signed)
Referring Provider: [facility]   Primary Care Physician:  Patient, No Pcp Per Primary Gastroenterologist:  unassigned  Reason for Consultation: diarrhea  ASSESSMENT AND PLAN:  1. 31 yo male with HIV / AIDS who was lost to follow up until late Sept when hospitalized with sepsis. Now on HIV meds. Admitted with failure to thrive, diarrhea, small drop in hgb from baseline. Nausea mentioned in chart. He describes more "gagging" than true nausea to me. Now on Megace for appetite stimulation  2. Chronic diarrhea, hx of IBS.  Recent giardia, treated with antibiotics. He doesn't give a great history but sounds like number of daily loose stools varies somewhere between 5-10 times a day, but not every day and not related to eating. . Diarrhea sounds more secretory. He is admitted with anorexia, failure to thrive , dehydration with electrolyte disturbances. His symptoms are probably multifactorial though I don't doubt that diarrhea largely contributing.  --Boost and like supplements may exacerbate diarrhea  3. Excessive hiccups, ? Reflux. He is getting BID ppi.   4. Microcytic anemia. Baseline hgb ~ mid 7 to mid 8 range. Hgb 6.5 today which is mild drop from baseline. I doubt hgb of 9 yesterday it accurate, suspect he was hemoconcentrated. No overt GI bleeding. Stool is heme positive but not surprising given all the diarrhea.   5. Chronic thrombocytopenia. Non-specific findings on recent BM bx     [facility] Attending   I have taken an interval history, reviewed the chart and examined the patient. I agree with the Advanced Practitioner's note, impression and recommendations.   Chronic diarrhea and only recently back on HIV Tx w/ persistent lymphopenia. I suspect lymphocyte counts still low and he is chronically infected with something leading to diarrhea. No role for endoscopy. AIDS is the problem - once that is adequately Tx he will do better.  Continue supportive care.  Await stool  studies.  Iva Boop, MD, [facility] [phone] (pager) 10/24/2017 4:43 PM  HPI: NICANOR MENDOLIA is a 31 y.o. male with HIV / AIDs but apparently lost to follow up until being hospitalized late last month with shock / sepsis, symptomatic anemia, malnutrition and giardia. He underwent BM bx for anemia ( non-specific findings). Giardia treated with flagyl.  Readmitted yesterday with failure to thrive, nausea, diarrhea, dehydration, electrolyte abnormalities, and  tachycardia.  ID has seen him. C-diff negative. Stool pathogen panel is pending.   Dmarcus gives a hx of IBS diagnosed in 2014 by PCP. He had no further problems until several months ago. He gives a very vague hx but from what I can tell he has several loose non-bloody stools most days without any relationship to eating.  He does have some formed stools sometimes. The loose stools are not associated with any cramping / abdominal pain. He thinks diarrhea improved after tx for giardia but can't say how much nor when he regressed. He complains of excessive hiccups. Nausea in chart but patient says it it more of a gagging with all the hiccups though he does occasionally vomit.   Past Medical History:  Diagnosis Date  . AIDS (acquired immune deficiency syndrome) (HCC)    hx/notes 09/25/2017  . Anemia   . History of blood transfusion 09/25/2017   "anemic"  . HIV (human immunodeficiency virus infection) (HCC)    hx/notes 09/24/2017    Past Surgical History:  Procedure Laterality Date  . FRACTURE SURGERY    . ORIF FINGER / THUMB FRACTURE Right   .  WRIST FRACTURE SURGERY Left     Prior to Admission medications   Medication Sig Start Date End Date Taking? Authorizing Provider  feeding supplement (BOOST / RESOURCE BREEZE) LIQD Take 1 Container by mouth 3 (three) times daily between meals. Patient taking differently: Take 237 mLs by mouth daily.  10/02/17  Yes [name], StollingsMaryann, DO  Multiple Vitamin (MULTIVITAMIN) LIQD  Take 5 mLs by mouth daily. TLC life changes/ Nutriburst   Yes [provider]  sulfamethoxazole-trimethoprim (BACTRIM,SEPTRA) 400-80 MG tablet Take 1 tablet by mouth daily. Patient taking differently: Take 1 tablet by mouth daily. 30 day course filled 10/03/17 10/03/17  Yes [name], [name], DO  TRIUMEQ 600-50-300 MG tablet Take 1 tablet by mouth daily. 10/01/17  Yes [provider]  bictegravir-emtricitabine-tenofovir AF (BIKTARVY) 50-200-25 MG TABS tablet Take 1 tablet by mouth daily. Try to take at the same time each day with or without food. Dec 26, 2017   [name], [name], NP  megestrol (MEGACE ES) 625 MG/5ML suspension Take 5 mLs (625 mg total) by mouth daily. Dec 26, 2017   [name], [name], NP  pantoprazole (PROTONIX) 40 MG tablet Take 1 tablet (40 mg total) by mouth 2 (two) times daily. Patient not taking: Reported on 10/18/2017 10/02/17   [name], [name], DO    Current Facility-Administered Medications  Medication Dose Route Frequency Provider Last Rate Last Dose  . 0.9 %  sodium chloride infusion  250 mL Intravenous PRN Emokpae, Courage, MD      . 0.9 %  sodium chloride infusion   Intravenous Continuous Noralee StainChoi, Jennifer Chahn-Yang, DO 150 mL/hr at 10/24/17 0701    . 0.9 %  sodium chloride infusion   Intravenous Once Dow AdolphHall, Carole N, DO      . acetaminophen (TYLENOL) tablet 650 mg  650 mg Oral Q6H PRN Emokpae, Courage, MD       Or  . acetaminophen (TYLENOL) suppository 650 mg  650 mg Rectal Q6H PRN Emokpae, Courage, MD      . albuterol (PROVENTIL) (2.5 MG/3ML) 0.083% nebulizer solution 2.5 mg  2.5 mg Nebulization Q2H PRN Emokpae, Courage, MD      . bictegravir-emtricitabine-tenofovir AF (BIKTARVY) 50-200-25 MG per tablet 1 tablet  1 tablet Oral Daily Emokpae, Courage, MD   1 tablet at 10/24/17 1009  . feeding supplement (BOOST / RESOURCE BREEZE) liquid 1 Container  1 Container Oral TID BM Shon HaleEmokpae, Courage, MD   1 Container at Dec 26, 2017 2045  . megestrol (MEGACE) 400 MG/10ML  suspension 400 mg  400 mg Oral Daily Emokpae, Courage, MD   400 mg at 10/24/17 1010  . multivitamin liquid 5 mL  5 mL Oral Daily Emokpae, Courage, MD   5 mL at 10/24/17 1011  . ondansetron (ZOFRAN) tablet 4 mg  4 mg Oral Q6H PRN Emokpae, Courage, MD       Or  . ondansetron (ZOFRAN) injection 4 mg  4 mg Intravenous Q6H PRN Mariea ClontsEmokpae, Courage, MD   4 mg at 10/24/17 1133  . sodium chloride flush (NS) 0.9 % injection 3 mL  3 mL Intravenous Q12H Emokpae, Courage, MD   3 mL at 10/24/17 1012  . sodium chloride flush (NS) 0.9 % injection 3 mL  3 mL Intravenous PRN Emokpae, Courage, MD      . sulfamethoxazole-trimethoprim (BACTRIM DS,SEPTRA DS) 800-160 MG per tablet 1 tablet  1 tablet Oral Daily Veryl Speakalone, Gregory D, FNP   1 tablet at 10/24/17 1010  . traZODone (DESYREL) tablet 50 mg  50 mg Oral QHS PRN Shon HaleEmokpae, Courage, MD  Allergies as of 10/29/17 - Review Complete 2017-10-29  Allergen Reaction Noted  . Penicillins Hives 03/02/2016    No family history on file.  Social History   Social History  . Marital status: Single    Spouse name: N/A  . Number of children: N/A  . Years of education: N/A   Occupational History  . Not on file.   Social History Main Topics  . Smoking status: Never Smoker  . Smokeless tobacco: Never Used  . Alcohol use 1.8 oz/week    3 Shots of liquor per week  . Drug use: No  . Sexual activity: No   Other Topics Concern  . Not on file   Social History Narrative  . No narrative on file    Review of Systems: All systems reviewed and negative except where noted in HPI.  Physical Exam: Vital signs in last 24 hours: Temp:  [97.5 F (36.4 C)-98.8 F (37.1 C)] 97.9 F (36.6 C) (10/26 1127) Pulse Rate:  [109-124] 113 (10/26 1127) Resp:  [14-21] 20 (10/26 1127) BP: (87-110)/(56-73) 110/71 (10/26 1127) SpO2:  [100 %] 100 % (10/26 1127) Weight:  [117 lb (53.1 kg)] 117 lb (53.1 kg) (10/25 1900) Last BM Date: 10/24/17 General:   Alert, very thin  malnourished looking black male in NAD. Mother in room.  Psych:  Pleasant, cooperative. Normal mood and affect. Eyes:  Pupils equal, sclera clear, no icterus.   Conjunctiva pink. Ears:  Normal auditory acuity. Nose:  No deformity, discharge,  or lesions. Neck:  Supple; no masses. Slight amount of white film towards back of throat Lungs:  Clear throughout to auscultation.   No wheezes, crackles, or rhonchi.  Heart:  Regular rate and rhythm; no murmurs, no edema Abdomen:  Soft, non-distended, nontender, BS active, no palp mass    Rectal:  Deferred  Msk:  Symmetrical without gross deformities. . Neurologic:  Alert and  oriented x4;  grossly normal neurologically. Skin:  Intact without significant lesions or rashes..   Intake/Output from previous day: 10/25 0701 - 10/26 0700 In: 3372.5 [P.O.:220; I.V.:2152.5; IV Piggyback:1000] Out: 150 [Urine:150] Intake/Output this shift: Total I/O In: 345 [Blood:345] Out: -   Lab Results:  Recent Labs  2017/10/29 1138 10/24/17 0301  WBC 12.0* 8.4  HGB 9.0* 6.5*  HCT 25.9* 18.7*  PLT 146* 120*   BMET  Recent Labs  Oct 29, 2017 1138 10/24/17 0301  NA 124* 127*  K 3.0* 2.9*  CL 101 106  CO2 15* 15*  GLUCOSE 142* 115*  BUN 28* 23*  CREATININE 1.24 0.96  CALCIUM 7.4* 6.7*   LFT  Recent Labs  10-29-2017 1138  PROT 5.5*  ALBUMIN 1.5*  AST 23  ALT 19  ALKPHOS 141*  BILITOT 1.9*   Studies/Results: Dg Chest 2 View  Result Date: 10-29-2017 CLINICAL DATA:  Weakness and dehydration.  HIV disease EXAM: CHEST  2 VIEW COMPARISON:  October 18, 2017 FINDINGS: Lungs are clear. Heart size and pulmonary vascularity are normal. No adenopathy. No bone lesions. No pneumothorax. IMPRESSION: No edema or consolidation. Electronically Signed   By: Bretta Bang III M.D.   On: 29-Oct-2017 12:25     Willette Cluster, NP-C @  10/24/2017, 2:35 PM  Pager number [phone]

## 2017-10-24 NOTE — Progress Notes (Signed)
RN called, 4 brownish watery stools. c-diff pcr negative. NS 150 cc/hr. Na+ 124 and K+ 3.0. Repeat BMP to monitor Na and K+.

## 2017-10-24 NOTE — Progress Notes (Signed)
PROGRESS NOTE    JUSTON GOHEEN  BJY:[id number] DOB: March 05, 1986 DOA: Nov 17, 2017 PCP: Patient, No Pcp Per     Brief Narrative:  GLYNDON TURSI is a 31 yo male with past medical history of AIDS who presented from HIV clinic due to concern for diarrhea and dehydration. He was recently treated for diarrhea with Flagyl. However, he admits to having about 4-5 loose bowel movements daily. Marland Kitchen He denies any nausea or vomiting. His other main complaint is hiccups, which he states that he gets when he receives IV fluids.   Assessment & Plan:   Principal Problem:   AIDS (acquired immune deficiency syndrome) (HCC) Active Problems:   Non-compliance   Protein-calorie malnutrition, severe   Diarrhea   Dehydration   Chronic diarrhea -Concern for CMV colitis. GI consulted ?flex sig -C Diff negative  -GI panel pending  Dehydration -IVF   GI bleed -Hgb 9 --> 6.5, FOBT positive -Transfuse 1u pRBC  -GI consulted   Hypovolemic hyponatremia -Continue normal saline IV fluid, trend BMP   Hypokalemia -Check Mg, replace K and trend   AIDS -Recent CD4 12, viral load 611,000 -ID following  -Biktarvy for ART, bactrim for PCP ppx     DVT prophylaxis: SCD Code Status: full Family Communication: at bedside Disposition Plan: pending improvement, back home   Consultants:   ID  GI  Procedures:   None  Antimicrobials:  Anti-infectives    Start     Dose/Rate Route Frequency Ordered Stop   10/24/17 1000  sulfamethoxazole-trimethoprim (BACTRIM DS,SEPTRA DS) 800-160 MG per tablet 1 tablet  Status:  Discontinued     1 tablet Oral Daily 11-17-17 1634 11-17-2017 1635   10/24/17 1000  sulfamethoxazole-trimethoprim (BACTRIM DS,SEPTRA DS) 800-160 MG per tablet 1 tablet     1 tablet Oral Daily 17-Nov-2017 1635     10/24/17 1000  bictegravir-emtricitabine-tenofovir AF (BIKTARVY) 50-200-25 MG per tablet 1 tablet  Status:  Discontinued     1 tablet Oral Daily November 17, 2017 1701 11/17/2017 1915   11-17-17  2030  bictegravir-emtricitabine-tenofovir AF (BIKTARVY) 50-200-25 MG per tablet 1 tablet    Comments:  Try to take at the same time each day with or without food.     1 tablet Oral Daily November 17, 2017 1854     November 17, 2017 1900  sulfamethoxazole-trimethoprim (BACTRIM,SEPTRA) 400-80 MG per tablet 1 tablet  Status:  Discontinued    Comments:  Patient taking differently: 30 day course filled 10/03/17     1 tablet Oral Daily 11-17-2017 1854 11/17/17 1915   Nov 17, 2017 1900  abacavir-dolutegravir-lamiVUDine (TRIUMEQ) 600-50-300 MG per tablet 1 tablet  Status:  Discontinued     1 tablet Oral Daily 11-17-17 1854 2017/11/17 1911       Subjective: Patient's main complaint includes chronic diarrhea 4-5 times daily as well as recurrent hiccups. He denies any fevers or chills at home, no chest pain or shortness of breath, no abdominal pain, nausea or vomiting. He states that he has been vomiting due to hiccups. He states that his hiccups always occur when he is on IV fluids.  Objective: Vitals:   10/24/17 0042 10/24/17 0613 10/24/17 1046 10/24/17 1127  BP: (!) 98/58 (!) 102/59 (!) 98/59 110/71  Pulse: (!) 118 (!) 114 (!) 113 (!) 113  Resp:  20 18 20   Temp:  (!) 97.5 F (36.4 C) 98.2 F (36.8 C) 97.9 F (36.6 C)  TempSrc:  Oral Oral Oral  SpO2:  100% 100% 100%  Weight:      Height:  Intake/Output Summary (Last 24 hours) at 10/24/17 1317 Last data filed at 10/24/17 1127  Gross per 24 hour  Intake           3402.5 ml  Output              150 ml  Net           3252.5 ml   Filed Weights   10-26-17 1900  Weight: 53.1 kg (117 lb)    Examination:  General exam: Appears calm and comfortable, thin  Respiratory system: Clear to auscultation. Respiratory effort normal. Cardiovascular system: S1 & S2 heard, tachycardic regular rhythm. No JVD, murmurs, rubs, gallops or clicks. No pedal edema. Gastrointestinal system: Abdomen is nondistended, soft and nontender. No organomegaly or masses felt. Normal bowel  sounds heard. Central nervous system: Alert and oriented. No focal neurological deficits. Extremities: Symmetric 5 x 5 power. Skin: No rashes, lesions or ulcers Psychiatry: Judgement and insight appear normal. Mood & affect appropriate.   Data Reviewed: I have personally reviewed following labs and imaging studies  CBC:  Recent Labs Lab 10/18/17 1531 Oct 26, 2017 1138 10/24/17 0301  WBC 10.3 12.0* 8.4  NEUTROABS 6.2 9.9*  --   HGB 10.7* 9.0* 6.5*  HCT 31.3* 25.9* 18.7*  MCV 78.8 76.0* 75.7*  PLT 180 146* 120*   Basic Metabolic Panel:  Recent Labs Lab 10/18/17 1531 10/18/17 1800 2017-10-26 1138 10/24/17 0301  NA 124*  --  124* 127*  K 4.2  --  3.0* 2.9*  CL 95*  --  101 106  CO2 19*  --  15* 15*  GLUCOSE 134*  --  142* 115*  BUN 18  --  28* 23*  CREATININE 0.92  --  1.24 0.96  CALCIUM 7.8*  --  7.4* 6.7*  MG  --  1.9 1.9  --    GFR: Estimated Creatinine Clearance: 83.7 mL/min (by C-G formula based on SCr of 0.96 mg/dL). Liver Function Tests:  Recent Labs Lab 10/18/17 1531 10-26-17 1138  AST 30 23  ALT 21 19  ALKPHOS 110 141*  BILITOT 1.4* 1.9*  PROT 6.0* 5.5*  ALBUMIN 1.7* 1.5*    Recent Labs Lab 10/18/17 1800  LIPASE 41   No results for input(s): AMMONIA in the last 168 hours. Coagulation Profile:  Recent Labs Lab 10/18/17 1800  INR 1.31   Cardiac Enzymes: No results for input(s): CKTOTAL, CKMB, CKMBINDEX, TROPONINI in the last 168 hours. BNP (last 3 results) No results for input(s): PROBNP in the last 8760 hours. HbA1C: No results for input(s): HGBA1C in the last 72 hours. CBG: No results for input(s): GLUCAP in the last 168 hours. Lipid Profile: No results for input(s): CHOL, HDL, LDLCALC, TRIG, CHOLHDL, LDLDIRECT in the last 72 hours. Thyroid Function Tests: No results for input(s): TSH, T4TOTAL, FREET4, T3FREE, THYROIDAB in the last 72 hours. Anemia Panel: No results for input(s): VITAMINB12, FOLATE, FERRITIN, TIBC, IRON, RETICCTPCT in  the last 72 hours. Sepsis Labs:  Recent Labs Lab 10/18/17 1821 10/18/17 2142 2017/10/26 1147 10-26-17 1322  LATICACIDVEN 1.77 0.64 1.50 1.58    Recent Results (from the past 240 hour(s))  Blood culture (routine x 2)     Status: None   Collection Time: 10/18/17  6:00 PM  Result Value Ref Range Status   Specimen Description BLOOD LEFT ANTECUBITAL  Final   Special Requests   Final    BOTTLES DRAWN AEROBIC AND ANAEROBIC Blood Culture adequate volume   Culture NO GROWTH 5 DAYS  Final  Report Status 10/27/2017 FINAL  Final  Blood culture (routine x 2)     Status: None   Collection Time: 10/18/17  6:31 PM  Result Value Ref Range Status   Specimen Description BLOOD RIGHT HAND  Final   Special Requests IN PEDIATRIC BOTTLE Blood Culture adequate volume  Final   Culture NO GROWTH 5 DAYS  Final   Report Status 10/01/2017 FINAL  Final  Urine culture     Status: Abnormal   Collection Time: 10/18/17  7:20 PM  Result Value Ref Range Status   Specimen Description URINE, RANDOM  Final   Special Requests NONE  Final   Culture <10,000 COLONIES/mL (A)  Final   Report Status 10/20/2017 FINAL  Final  Culture, blood (routine x 2)     Status: None (Preliminary result)   Collection Time: 10/12/2017  1:00 PM  Result Value Ref Range Status   Specimen Description BLOOD RIGHT ANTECUBITAL  Final   Special Requests   Final    BOTTLES DRAWN AEROBIC AND ANAEROBIC Blood Culture adequate volume   Culture NO GROWTH < 24 HOURS  Final   Report Status PENDING  Incomplete  Culture, blood (routine x 2)     Status: None (Preliminary result)   Collection Time: 10/28/2017  1:19 PM  Result Value Ref Range Status   Specimen Description BLOOD LEFT ANTECUBITAL  Final   Special Requests   Final    BOTTLES DRAWN AEROBIC AND ANAEROBIC Blood Culture adequate volume   Culture NO GROWTH < 24 HOURS  Final   Report Status PENDING  Incomplete  C difficile quick scan w PCR reflex     Status: None   Collection Time: 10/12/2017   4:48 PM  Result Value Ref Range Status   C Diff antigen NEGATIVE NEGATIVE Final   C Diff toxin NEGATIVE NEGATIVE Final   C Diff interpretation No C. difficile detected.  Final  Culture, blood (Routine X 2) w Reflex to ID Panel     Status: None (Preliminary result)   Collection Time: 10/27/2017  8:05 PM  Result Value Ref Range Status   Specimen Description BLOOD LEFT HAND  Final   Special Requests IN PEDIATRIC BOTTLE Blood Culture adequate volume  Final   Culture NO GROWTH < 24 HOURS  Final   Report Status PENDING  Incomplete  Culture, blood (Routine X 2) w Reflex to ID Panel     Status: None (Preliminary result)   Collection Time: 10/14/2017  8:05 PM  Result Value Ref Range Status   Specimen Description BLOOD LEFT ARM  Final   Special Requests IN PEDIATRIC BOTTLE Blood Culture adequate volume  Final   Culture NO GROWTH < 24 HOURS  Final   Report Status PENDING  Incomplete       Radiology Studies: Dg Chest 2 View  Result Date: 10/25/2017 CLINICAL DATA:  Weakness and dehydration.  HIV disease EXAM: CHEST  2 VIEW COMPARISON:  October 18, 2017 FINDINGS: Lungs are clear. Heart size and pulmonary vascularity are normal. No adenopathy. No bone lesions. No pneumothorax. IMPRESSION: No edema or consolidation. Electronically Signed   By: Bretta Bang III M.D.   On: 10/08/2017 12:25      Scheduled Meds: . bictegravir-emtricitabine-tenofovir AF  1 tablet Oral Daily  . feeding supplement  1 Container Oral TID BM  . megestrol  400 mg Oral Daily  . multivitamin  5 mL Oral Daily  . sodium chloride flush  3 mL Intravenous Q12H  . sulfamethoxazole-trimethoprim  1  tablet Oral Daily   Continuous Infusions: . sodium chloride    . sodium chloride 150 mL/hr at 10/24/17 0701  . sodium chloride       LOS: 1 day    Time spent: 40 minutes   Noralee StainJennifer Tangelia Sanson, DO [facility] [email or web address] Password [email or web address] 10/24/2017, 1:17 PM

## 2017-10-24 NOTE — Progress Notes (Signed)
CRITICAL VALUE ALERT  Critical Value:NA=127;K+=2.9;hgb=6.5;hct=18.7  Date & Time Notied:  10/24/17  Provider Notified: Sterling Bigr.Carole Hall  Orders Received/Actions taken: ordered Pot.40 meq & to Type & crossmatch 1 unit PRBC & TO TRANSFUSE

## 2017-10-24 NOTE — Progress Notes (Signed)
CM scheduled post hospital follow up for 11/14/2017 at [facility]. Gae GallopAngela Angelissa Supan RN,BSN,CM

## 2017-10-24 NOTE — Progress Notes (Signed)
[facility]    Date of Admission:  2017-02-13   Total days of antibiotics 2           ID: Anthony Yang is a 31 y.o. male with advanced hiv disease, CD 4 count of 12/VL 611,000 recently started on biktarvy with chronic diarrhea and 50lb weight loss Principal Problem:   AIDS (acquired immune deficiency syndrome) (HCC) Active Problems:   Non-compliance   Protein-calorie malnutrition, severe   Diarrhea   Dehydration   Chronic diarrhea    Subjective: Having watery diarrhea, not necessarily after eating and feeling weak  Medications:  . bictegravir-emtricitabine-tenofovir AF  1 tablet Oral Daily  . feeding supplement  1 Container Oral TID BM  . feeding supplement (PRO-STAT SUGAR FREE 64)  30 mL Oral TID  . megestrol  400 mg Oral Daily  . multivitamin  5 mL Oral Daily  . sodium chloride flush  3 mL Intravenous Q12H  . sulfamethoxazole-trimethoprim  1 tablet Oral Daily    Objective: Vital signs in last 24 hours: Temp:  [97.5 F (36.4 C)-98.8 F (37.1 C)] 98.8 F (37.1 C) (10/26 1403) Pulse Rate:  [113-124] 122 (10/26 1403) Resp:  [18-21] 20 (10/26 1403) BP: (95-110)/(56-71) 107/60 (10/26 1403) SpO2:  [100 %] 100 % (10/26 1403) Weight:  [117 lb (53.1 kg)] 117 lb (53.1 kg) (10/25 1900) Physical Exam  Constitutional: He is oriented to person, place, and time. He appears chronically ill, cachetic No distress.  HENT: facial wasting, Mouth/Throat: Oropharynx is clear and moist. No oropharyngeal exudate.  Cardiovascular: Normal rate, regular rhythm and normal heart sounds. Exam reveals no gallop and no friction rub.  No murmur heard.  Pulmonary/Chest: Effort normal and breath sounds normal. No respiratory distress. He has no wheezes.  Abdominal: Soft. Bowel sounds are normal. He exhibits no distension. There is no tenderness.  Lymphadenopathy:  He has no cervical adenopathy.  Neurological: He is alert and oriented to person, place, and time.    Skin: Skin is warm and dry. No rash noted. No erythema.  Psychiatric: He has a normal mood and affect. His behavior is normal.    Lab Results  Recent Labs  10-05-2017 1138 10/24/17 0301  WBC 12.0* 8.4  HGB 9.0* 6.5*  HCT 25.9* 18.7*  NA 124* 127*  K 3.0* 2.9*  CL 101 106  CO2 15* 15*  BUN 28* 23*  CREATININE 1.24 0.96   Liver Panel  Recent Labs  10-05-2017 1138  PROT 5.5*  ALBUMIN 1.5*  AST 23  ALT 19  ALKPHOS 141*  BILITOT 1.9*    Microbiology: Gi pathogen panel still pending Studies/Results: Dg Chest 2 View  Result Date: 2017-02-13 CLINICAL DATA:  Weakness and dehydration.  HIV disease EXAM: CHEST  2 VIEW COMPARISON:  October 18, 2017 FINDINGS: Lungs are clear. Heart size and pulmonary vascularity are normal. No adenopathy. No bone lesions. No pneumothorax. IMPRESSION: No edema or consolidation. Electronically Signed   By: Bretta BangWilliam  Woodruff III M.D.   On: 2017-02-13 12:25     Assessment/Plan: HIV/AIDS = continue on biktarvy  Chronic diarrhea = awaiting gi pathogen panel but I suspect this still has broad differential. Recommend CMV stool  And CMV viral load. Would also send for AFB stool culture. And AFB blood culture  Hypokalemia = likely form gi loss, needs scheduled supplementation while having profuse diarrhea  oi proph = continue on daily bactrim, weekly azithromycin  Anemia = recommend checking parvovirus   Severe protein-calorie malnutrition =  defer to nutritional recs.  Drue Second [facility] Cell: [phone] Pager: [phone]  10/24/2017, 5:40 PM

## 2017-10-24 NOTE — Progress Notes (Signed)
Initial Nutrition Assessment  DOCUMENTATION CODES:   Severe malnutrition in context of chronic illness  INTERVENTION:   -Continue Boost Breeze po TID, each supplement provides 250 kcal and 9 grams of protein -Continue liquid MVI daily -30 ml Prostat TID, each supplement provides 100 kcals and 15 grams protein -If pt remains unable to meet needs PO, recommend initiation of nutrition support. Recommend:  Initiate Osmolite 1.5 @ 20 ml/hr and increase by 10 ml every 12 hours to goal rate of 50 ml/hr.   30 ml Prostat BID.    Tube feeding regimen provides 2000 kcal (100% of needs), 105 grams of protein, and 914 ml of H2O.   -If nutrition support is initiated, recommend monitor Mg, K, and Phos daily x 3 days and replete as necessary due to high refeeding risk  NUTRITION DIAGNOSIS:   Severe Malnutrition related to chronic illness (AIDS) as evidenced by energy intake < 75% for > or equal to 1 month, percent weight loss.  GOAL:   Patient will meet greater than or equal to 90% of their needs  MONITOR:   PO intake, Supplement acceptance, Labs, Weight trends, Skin, I & O's  REASON FOR ASSESSMENT:   Malnutrition Screening Tool, Consult Assessment of nutrition requirement/status  ASSESSMENT:   Anthony Yang  is a 31 y.o. male with past medical history relevant for HIV/AIDs (CD4 count was 28) [facility]  being off ART x 2 years with 2 month (originally diagnosed with HIV in 2013 when he was being treated for syphilis) who now presents to the ED from the HIV/infectious disease clinic with concerns about persistent diarrhea and dehydration. Patient has a history of about 50 pound weight loss over the last 6 months or so.  Pt admitted with AIDS.   Case discussed with RN prior to visit. She reports that pt is eating very little and struggling to take medications and oral prep. Per ID, evaluation pt with potential CMV colitis and awaiting GI  evaluation for potential flex sig.   Spoke with pt and parents at bedside. Pt reports appetite has been "non-existent" for at least the past month. Noted meal completion 0%. Pt with cup of oral prep and water- pt has not attempted to drink yet.   Pt parents also endorse poor appetite and weight loss, but did not provide further details. Per wt hx, noted UBW around 170#. Pt has experienced a 17% wt loss over the past month, which is significant for time frame.   Suspect it will be difficult for pt to maintain adequate nutrition via PO route given malnutrition with continued wt loss and poor oral intake; may benefit from at least short-term nutrition support.  Pt and parents politely declined nutrition-focused physical exam to allow pt to take his medications. Returned x 2 to obtain exam, however, pt was receiving nursing care at times of visits. Per RD note on 09/26/17, pt with severe fat and muscle depletion- RD suspects continued findings on exam given pt's gaunt appearance and continued wt loss.   Medications reviewed and include megace.   Labs reviewed: Na: 127, K: 2.9.   NUTRITION - FOCUSED PHYSICAL EXAM:    Most Recent Value  Orbital Region  Unable to assess  Upper Arm Region  Unable to assess  Thoracic and Lumbar Region  Unable to assess  Buccal Region  Unable to assess  Temple Region  Unable to assess  Clavicle Bone Region  Unable to assess  Clavicle  and Acromion Bone Region  Unable to assess  Scapular Bone Region  Unable to assess  Dorsal Hand  Unable to assess  Patellar Region  Unable to assess  Anterior Thigh Region  Unable to assess  Posterior Calf Region  Unable to assess  Edema (RD Assessment)  Unable to assess  Hair  Unable to assess  Eyes  Unable to assess  Mouth  Unable to assess  Skin  Unable to assess  Nails  Unable to assess       Diet Order:  Diet regular Room service appropriate? Yes; Fluid consistency: Thin  EDUCATION NEEDS:   Not appropriate for  education at this time  Skin:  Skin Assessment: Skin Integrity Issues: Skin Integrity Issues:: Incisions Incisions: sacrum Other: open wound on coccyx and sacrum  Last BM:  10/24/17  Height:   Ht Readings from Last 1 Encounters:  10/28/2017 5\' 6"  (1.676 m)    Weight:   Wt Readings from Last 1 Encounters:  10/11/2017 117 lb (53.1 kg)    Ideal Body Weight:  64.5 kg  BMI:  Body mass index is 18.88 kg/m.  Estimated Nutritional Needs:   Kcal:  1900-2100  Protein:  105-120 grams  Fluid:  > 1.9 L   Anthony Yang A. Mayford Knife, RD, LDN, CDE Pager: [phone] After hours Pager: [phone]

## 2017-10-25 DIAGNOSIS — R634 Abnormal weight loss: Secondary | ICD-10-CM

## 2017-10-25 DIAGNOSIS — D649 Anemia, unspecified: Secondary | ICD-10-CM

## 2017-10-25 LAB — CBC WITH DIFFERENTIAL/PLATELET
BASOS ABS: 0 10*3/uL (ref 0.0–0.1)
Basophils Relative: 0 %
EOS ABS: 0.2 10*3/uL (ref 0.0–0.7)
Eosinophils Relative: 2 %
HEMATOCRIT: 18.5 % — AB (ref 39.0–52.0)
HEMOGLOBIN: 6.4 g/dL — AB (ref 13.0–17.0)
LYMPHS PCT: 8 %
Lymphs Abs: 0.9 10*3/uL (ref 0.7–4.0)
MCH: 25.9 pg — ABNORMAL LOW (ref 26.0–34.0)
MCHC: 34.6 g/dL (ref 30.0–36.0)
MCV: 74.9 fL — ABNORMAL LOW (ref 78.0–100.0)
MONOS PCT: 12 %
Monocytes Absolute: 1.4 10*3/uL — ABNORMAL HIGH (ref 0.1–1.0)
NEUTROS PCT: 78 %
Neutro Abs: 9 10*3/uL — ABNORMAL HIGH (ref 1.7–7.7)
Platelets: 119 10*3/uL — ABNORMAL LOW (ref 150–400)
RBC: 2.47 MIL/uL — ABNORMAL LOW (ref 4.22–5.81)
RDW: 19 % — ABNORMAL HIGH (ref 11.5–15.5)
WBC: 11.5 10*3/uL — AB (ref 4.0–10.5)

## 2017-10-25 LAB — BASIC METABOLIC PANEL
ANION GAP: 7 (ref 5–15)
BUN: 18 mg/dL (ref 6–20)
CALCIUM: 6.5 mg/dL — AB (ref 8.9–10.3)
CHLORIDE: 108 mmol/L (ref 101–111)
CO2: 12 mmol/L — AB (ref 22–32)
CREATININE: 1 mg/dL (ref 0.61–1.24)
GFR calc non Af Amer: >60 mL/min (ref >60)
GLUCOSE: 90 mg/dL (ref 65–99)
Potassium: 3 mmol/L — ABNORMAL LOW (ref 3.5–5.1)
SODIUM: 127 mmol/L — AB (ref 135–145)

## 2017-10-25 LAB — URINE CULTURE: SPECIAL REQUESTS: NORMAL

## 2017-10-25 LAB — PREPARE RBC (CROSSMATCH)

## 2017-10-25 LAB — MAGNESIUM: Magnesium: 1.4 mg/dL — ABNORMAL LOW (ref 1.7–2.4)

## 2017-10-25 LAB — CMV ANTIBODY, IGG (EIA)

## 2017-10-25 MED ORDER — POTASSIUM CHLORIDE CRYS ER 20 MEQ PO TBCR
40.0000 meq | EXTENDED_RELEASE_TABLET | ORAL | Status: AC
  Administered 2017-10-25 (×2): 40 meq via ORAL
  Filled 2017-10-25 (×2): qty 2

## 2017-10-25 MED ORDER — SODIUM CHLORIDE 0.9 % IV SOLN
Freq: Once | INTRAVENOUS | Status: AC
  Administered 2017-10-25: 15:00:00 via INTRAVENOUS

## 2017-10-25 MED ORDER — MAGNESIUM SULFATE 2 GM/50ML IV SOLN
2.0000 g | Freq: Once | INTRAVENOUS | Status: AC
  Administered 2017-10-25: 2 g via INTRAVENOUS
  Filled 2017-10-25: qty 50

## 2017-10-25 NOTE — Progress Notes (Signed)
[facility]   Reason for visit: Follow up on HIV/AIDS  Interval History: still with diarrhea, some po intake, GI panel negative; no fever; WBC mildly elevated to 11.5; Tmax 100.3 Biktarvy Bactrim prophylaxis  Physical Exam: Constitutional:  Vitals:   10/24/17 2301 10/25/17 0532  BP:  (!) 95/50  Pulse: (!) 124 (!) 115  Resp:  19  Temp: 99.5 F (37.5 C) 99.3 F (37.4 C)  SpO2: 100% 100%   patient appears in NAD; chronicallly ill appearing Eyes: anicteric HENT: no thrush Respiratory: Normal respiratory effort; CTA B Cardiovascular: RRR GI: soft, nt, nd  Review of Systems: Constitutional: negative for fevers and chills Respiratory: negative for cough or dyspnea on exertion Integument/breast: negative for rash  Lab Results  Component Value Date   WBC 11.5 (H) 10/25/2017   HGB 6.4 (LL) 10/25/2017   HCT 18.5 (L) 10/25/2017   MCV 74.9 (L) 10/25/2017   PLT 119 (L) 10/25/2017    Lab Results  Component Value Date   CREATININE 1.00 10/25/2017   BUN 18 10/25/2017   NA 127 (L) 10/25/2017   K 3.0 (L) 10/25/2017   CL 108 10/25/2017   CO2 12 (L) 10/25/2017    Lab Results  Component Value Date   ALT 19 10-31-17   AST 23 2017/10/31   ALKPHOS 141 (H) 10-31-17     Microbiology: Recent Results (from the past 240 hour(s))  Blood culture (routine x 2)     Status: None   Collection Time: 10/18/17  6:00 PM  Result Value Ref Range Status   Specimen Description BLOOD LEFT ANTECUBITAL  Final   Special Requests   Final    BOTTLES DRAWN AEROBIC AND ANAEROBIC Blood Culture adequate volume   Culture NO GROWTH 5 DAYS  Final   Report Status 10/31/2017 FINAL  Final  Blood culture (routine x 2)     Status: None   Collection Time: 10/18/17  6:31 PM  Result Value Ref Range Status   Specimen Description BLOOD RIGHT HAND  Final   Special Requests IN PEDIATRIC BOTTLE Blood Culture adequate volume  Final   Culture NO GROWTH 5 DAYS  Final   Report Status  Oct 31, 2017 FINAL  Final  Urine culture     Status: Abnormal   Collection Time: 10/18/17  7:20 PM  Result Value Ref Range Status   Specimen Description URINE, RANDOM  Final   Special Requests NONE  Final   Culture <10,000 COLONIES/mL (A)  Final   Report Status 10/20/2017 FINAL  Final  Urine Culture     Status: Abnormal   Collection Time: 10/31/17  3:34 AM  Result Value Ref Range Status   Specimen Description URINE, CLEAN CATCH  Final   Special Requests Normal  Final   Culture <10,000 COLONIES/mL INSIGNIFICANT GROWTH (A)  Final   Report Status 10/25/2017 FINAL  Final  Culture, blood (routine x 2)     Status: None (Preliminary result)   Collection Time: 10/31/2017  1:00 PM  Result Value Ref Range Status   Specimen Description BLOOD RIGHT ANTECUBITAL  Final   Special Requests   Final    BOTTLES DRAWN AEROBIC AND ANAEROBIC Blood Culture adequate volume   Culture NO GROWTH 2 DAYS  Final   Report Status PENDING  Incomplete  Culture, blood (routine x 2)     Status: None (Preliminary result)   Collection Time: October 31, 2017  1:19 PM  Result Value Ref Range Status   Specimen Description BLOOD LEFT ANTECUBITAL  Final   Special Requests   Final    BOTTLES DRAWN AEROBIC AND ANAEROBIC Blood Culture adequate volume   Culture NO GROWTH 2 DAYS  Final   Report Status PENDING  Incomplete  C difficile quick scan w PCR reflex     Status: None   Collection Time: 10/08/2017  4:48 PM  Result Value Ref Range Status   C Diff antigen NEGATIVE NEGATIVE Final   C Diff toxin NEGATIVE NEGATIVE Final   C Diff interpretation No C. difficile detected.  Final  Gastrointestinal Panel by PCR , Stool     Status: None   Collection Time: 09/30/2017  4:48 PM  Result Value Ref Range Status   Campylobacter species NOT DETECTED NOT DETECTED Final   Plesimonas shigelloides NOT DETECTED NOT DETECTED Final   Salmonella species NOT DETECTED NOT DETECTED Final   Yersinia enterocolitica NOT DETECTED NOT DETECTED Final   Vibrio  species NOT DETECTED NOT DETECTED Final   Vibrio cholerae NOT DETECTED NOT DETECTED Final   Enteroaggregative E coli (EAEC) NOT DETECTED NOT DETECTED Final   Enteropathogenic E coli (EPEC) NOT DETECTED NOT DETECTED Final   Enterotoxigenic E coli (ETEC) NOT DETECTED NOT DETECTED Final   Shiga like toxin producing E coli (STEC) NOT DETECTED NOT DETECTED Final   Shigella/Enteroinvasive E coli (EIEC) NOT DETECTED NOT DETECTED Final   Cryptosporidium NOT DETECTED NOT DETECTED Final   Cyclospora cayetanensis NOT DETECTED NOT DETECTED Final   Entamoeba histolytica NOT DETECTED NOT DETECTED Final   Giardia lamblia NOT DETECTED NOT DETECTED Final   Adenovirus F40/41 NOT DETECTED NOT DETECTED Final   Astrovirus NOT DETECTED NOT DETECTED Final   Norovirus GI/GII NOT DETECTED NOT DETECTED Final   Rotavirus A NOT DETECTED NOT DETECTED Final   Sapovirus (I, II, IV, and V) NOT DETECTED NOT DETECTED Final  Culture, blood (Routine X 2) w Reflex to ID Panel     Status: None (Preliminary result)   Collection Time: 10/12/2017  8:05 PM  Result Value Ref Range Status   Specimen Description BLOOD LEFT HAND  Final   Special Requests IN PEDIATRIC BOTTLE Blood Culture adequate volume  Final   Culture NO GROWTH 2 DAYS  Final   Report Status PENDING  Incomplete  Culture, blood (Routine X 2) w Reflex to ID Panel     Status: None (Preliminary result)   Collection Time: 09/29/2017  8:05 PM  Result Value Ref Range Status   Specimen Description BLOOD LEFT ARM  Final   Special Requests IN PEDIATRIC BOTTLE Blood Culture adequate volume  Final   Culture NO GROWTH 2 DAYS  Final   Report Status PENDING  Incomplete    Impression/Plan:  1. AIDS - CD4 of 12, on Biktarvy and will continue  2.  Opportunistic infection risk - on bactrim prophylaxis and will continue  3. Diarrhea - no colonoscopy indicated per GI.  CMV pcr, other studies sent off.  Needs to remain on ARVs.    4.  Weight loss - from #3.  Needs to really focus  on intake.    5.  Anemia - parvovirus sent.  Fecal occult blood positive.  Continue to monitor

## 2017-10-25 NOTE — Progress Notes (Signed)
PROGRESS NOTE    Anthony Yang  ZOX:[id number] DOB: September 01, 1986 DOA: 10/06/2017 PCP: Patient, No Pcp Per     Brief Narrative:  Anthony Yang is a 31 yo male with past medical history of AIDS who presented from HIV clinic due to concern for diarrhea and dehydration. He was recently treated for diarrhea with Flagyl. However, he admits to having about 4-5 loose bowel movements daily. He denies any nausea or vomiting. He was admitted for further evaluation and treatment of chronic diarrhea, dehydration.   Assessment & Plan:   Principal Problem:   AIDS (acquired immune deficiency syndrome) (HCC) Active Problems:   Non-compliance   Protein-calorie malnutrition, severe   Diarrhea   Dehydration   Chronic diarrhea   Chronic diarrhea -C Diff negative  -GI panel negative -GI consulted, they recommend treatment for AIDS, no plan for endoscopy now  -ID checking CMV stool, AFB stool and culture   Dehydration -IVF   GI bleed -Hgb 9 --> 6.5 (baseline Hgb 7-8). FOBT positive -S/p 1u pRBC 10/26. Transfuse additional 2u pRBC 10/27  -GI consulted, no plan for endoscopy now   Hypovolemic hyponatremia -Continue normal saline IV fluid, trend BMP   Hypokalemia -Replace, trend  Hypomagnesemia -Replace, trend   AIDS -Recent CD4 12, viral load 611,000 -ID following  -Biktarvy for ART, bactrim for PCP ppx    DVT prophylaxis: SCD Code Status: full Family Communication: mom at bedside Disposition Plan: pending improvement, back home   Consultants:   ID  GI  Procedures:   None  Antimicrobials:  Anti-infectives    Start     Dose/Rate Route Frequency Ordered Stop   10/24/17 1000  sulfamethoxazole-trimethoprim (BACTRIM DS,SEPTRA DS) 800-160 MG per tablet 1 tablet  Status:  Discontinued     1 tablet Oral Daily 10/08/2017 1634 10/03/2017 1635   10/24/17 1000  sulfamethoxazole-trimethoprim (BACTRIM DS,SEPTRA DS) 800-160 MG per tablet 1 tablet     1 tablet Oral Daily 10/19/2017 1635       10/24/17 1000  bictegravir-emtricitabine-tenofovir AF (BIKTARVY) 50-200-25 MG per tablet 1 tablet  Status:  Discontinued     1 tablet Oral Daily 10/19/2017 1701 10/21/2017 1915   09/30/2017 2030  bictegravir-emtricitabine-tenofovir AF (BIKTARVY) 50-200-25 MG per tablet 1 tablet    Comments:  Try to take at the same time each day with or without food.     1 tablet Oral Daily 10/26/2017 1854     10/26/2017 1900  sulfamethoxazole-trimethoprim (BACTRIM,SEPTRA) 400-80 MG per tablet 1 tablet  Status:  Discontinued    Comments:  Patient taking differently: 30 day course filled 10/03/17     1 tablet Oral Daily 10/05/2017 1854 09/30/2017 1915   10/14/2017 1900  abacavir-dolutegravir-lamiVUDine (TRIUMEQ) 600-50-300 MG per tablet 1 tablet  Status:  Discontinued     1 tablet Oral Daily 10/29/2017 1854 10/12/2017 1911       Subjective: Patient is very frustrated this morning due to menu issues with his diet. He continues to have loose bowel movements, but overall feeling better. Hiccups resolved, no vomiting, no abdominal pain.    Objective: Vitals:   10/24/17 1403 10/24/17 2148 10/24/17 2301 10/25/17 0532  BP: 107/60 105/60  (!) 95/50  Pulse: (!) 122 (!) 130 (!) 124 (!) 115  Resp: 20 (!) 22  19  Temp: 98.8 F (37.1 C) 100.3 F (37.9 C) 99.5 F (37.5 C) 99.3 F (37.4 C)  TempSrc: Oral Oral Oral Oral  SpO2: 100% 100% 100% 100%  Weight:  Height:        Intake/Output Summary (Last 24 hours) at 10/25/17 1214 Last data filed at 10/25/17 0606  Gross per 24 hour  Intake           3147.5 ml  Output                0 ml  Net           3147.5 ml   Filed Weights   10/11/2017 1900  Weight: 53.1 kg (117 lb)    Examination:  General exam: Appears calm and comfortable, thin  Respiratory system: Clear to auscultation. Respiratory effort normal. Cardiovascular system: S1 & S2 heard, tachycardic regular rhythm. No JVD, murmurs, rubs, gallops or clicks. No pedal edema. Gastrointestinal system: Abdomen is  nondistended, soft and nontender. No organomegaly or masses felt. Normal bowel sounds heard. Central nervous system: Alert and oriented. No focal neurological deficits. Extremities: Symmetric 5 x 5 power. Skin: No rashes, lesions or ulcers Psychiatry: Judgement and insight appear normal. Mood & affect appropriate.   Data Reviewed: I have personally reviewed following labs and imaging studies  CBC:  Recent Labs Lab 10/18/17 1531 10/27/2017 1138 10/24/17 0301 10/25/17 0637  WBC 10.3 12.0* 8.4 11.5*  NEUTROABS 6.2 9.9*  --  9.0*  HGB 10.7* 9.0* 6.5* 6.4*  HCT 31.3* 25.9* 18.7* 18.5*  MCV 78.8 76.0* 75.7* 74.9*  PLT 180 146* 120* 119*   Basic Metabolic Panel:  Recent Labs Lab 10/18/17 1531 10/18/17 1800 10/11/2017 1138 10/24/17 0301 10/25/17 0637  NA 124*  --  124* 127* 127*  K 4.2  --  3.0* 2.9* 3.0*  CL 95*  --  101 106 108  CO2 19*  --  15* 15* 12*  GLUCOSE 134*  --  142* 115* 90  BUN 18  --  28* 23* 18  CREATININE 0.92  --  1.24 0.96 1.00  CALCIUM 7.8*  --  7.4* 6.7* 6.5*  MG  --  1.9 1.9  --  1.4*   GFR: Estimated Creatinine Clearance: 80.4 mL/min (by C-G formula based on SCr of 1 mg/dL). Liver Function Tests:  Recent Labs Lab 10/18/17 1531 10/06/2017 1138  AST 30 23  ALT 21 19  ALKPHOS 110 141*  BILITOT 1.4* 1.9*  PROT 6.0* 5.5*  ALBUMIN 1.7* 1.5*    Recent Labs Lab 10/18/17 1800  LIPASE 41   No results for input(s): AMMONIA in the last 168 hours. Coagulation Profile:  Recent Labs Lab 10/18/17 1800  INR 1.31   Cardiac Enzymes: No results for input(s): CKTOTAL, CKMB, CKMBINDEX, TROPONINI in the last 168 hours. BNP (last 3 results) No results for input(s): PROBNP in the last 8760 hours. HbA1C: No results for input(s): HGBA1C in the last 72 hours. CBG: No results for input(s): GLUCAP in the last 168 hours. Lipid Profile: No results for input(s): CHOL, HDL, LDLCALC, TRIG, CHOLHDL, LDLDIRECT in the last 72 hours. Thyroid Function Tests: No  results for input(s): TSH, T4TOTAL, FREET4, T3FREE, THYROIDAB in the last 72 hours. Anemia Panel: No results for input(s): VITAMINB12, FOLATE, FERRITIN, TIBC, IRON, RETICCTPCT in the last 72 hours. Sepsis Labs:  Recent Labs Lab 10/18/17 1821 10/18/17 2142 10/06/2017 1147 10/28/2017 1322  LATICACIDVEN 1.77 0.64 1.50 1.58    Recent Results (from the past 240 hour(s))  Blood culture (routine x 2)     Status: None   Collection Time: 10/18/17  6:00 PM  Result Value Ref Range Status   Specimen Description BLOOD LEFT ANTECUBITAL  Final   Special Requests   Final    BOTTLES DRAWN AEROBIC AND ANAEROBIC Blood Culture adequate volume   Culture NO GROWTH 5 DAYS  Final   Report Status 10/22/2017 FINAL  Final  Blood culture (routine x 2)     Status: None   Collection Time: 10/18/17  6:31 PM  Result Value Ref Range Status   Specimen Description BLOOD RIGHT HAND  Final   Special Requests IN PEDIATRIC BOTTLE Blood Culture adequate volume  Final   Culture NO GROWTH 5 DAYS  Final   Report Status 10/12/2017 FINAL  Final  Urine culture     Status: Abnormal   Collection Time: 10/18/17  7:20 PM  Result Value Ref Range Status   Specimen Description URINE, RANDOM  Final   Special Requests NONE  Final   Culture <10,000 COLONIES/mL (A)  Final   Report Status 10/20/2017 FINAL  Final  Urine Culture     Status: Abnormal   Collection Time: 10/19/2017  3:34 AM  Result Value Ref Range Status   Specimen Description URINE, CLEAN CATCH  Final   Special Requests Normal  Final   Culture <10,000 COLONIES/mL INSIGNIFICANT GROWTH (A)  Final   Report Status 10/25/2017 FINAL  Final  Culture, blood (routine x 2)     Status: None (Preliminary result)   Collection Time: 10/22/2017  1:00 PM  Result Value Ref Range Status   Specimen Description BLOOD RIGHT ANTECUBITAL  Final   Special Requests   Final    BOTTLES DRAWN AEROBIC AND ANAEROBIC Blood Culture adequate volume   Culture NO GROWTH 2 DAYS  Final   Report Status  PENDING  Incomplete  Culture, blood (routine x 2)     Status: None (Preliminary result)   Collection Time: 10/20/2017  1:19 PM  Result Value Ref Range Status   Specimen Description BLOOD LEFT ANTECUBITAL  Final   Special Requests   Final    BOTTLES DRAWN AEROBIC AND ANAEROBIC Blood Culture adequate volume   Culture NO GROWTH 2 DAYS  Final   Report Status PENDING  Incomplete  C difficile quick scan w PCR reflex     Status: None   Collection Time: 10/03/2017  4:48 PM  Result Value Ref Range Status   C Diff antigen NEGATIVE NEGATIVE Final   C Diff toxin NEGATIVE NEGATIVE Final   C Diff interpretation No C. difficile detected.  Final  Gastrointestinal Panel by PCR , Stool     Status: None   Collection Time: 09/29/2017  4:48 PM  Result Value Ref Range Status   Campylobacter species NOT DETECTED NOT DETECTED Final   Plesimonas shigelloides NOT DETECTED NOT DETECTED Final   Salmonella species NOT DETECTED NOT DETECTED Final   Yersinia enterocolitica NOT DETECTED NOT DETECTED Final   Vibrio species NOT DETECTED NOT DETECTED Final   Vibrio cholerae NOT DETECTED NOT DETECTED Final   Enteroaggregative E coli (EAEC) NOT DETECTED NOT DETECTED Final   Enteropathogenic E coli (EPEC) NOT DETECTED NOT DETECTED Final   Enterotoxigenic E coli (ETEC) NOT DETECTED NOT DETECTED Final   Shiga like toxin producing E coli (STEC) NOT DETECTED NOT DETECTED Final   Shigella/Enteroinvasive E coli (EIEC) NOT DETECTED NOT DETECTED Final   Cryptosporidium NOT DETECTED NOT DETECTED Final   Cyclospora cayetanensis NOT DETECTED NOT DETECTED Final   Entamoeba histolytica NOT DETECTED NOT DETECTED Final   Giardia lamblia NOT DETECTED NOT DETECTED Final   Adenovirus F40/41 NOT DETECTED NOT DETECTED Final   Astrovirus NOT  DETECTED NOT DETECTED Final   Norovirus GI/GII NOT DETECTED NOT DETECTED Final   Rotavirus A NOT DETECTED NOT DETECTED Final   Sapovirus (I, II, IV, and V) NOT DETECTED NOT DETECTED Final  Culture, blood  (Routine X 2) w Reflex to ID Panel     Status: None (Preliminary result)   Collection Time: 09/30/2017  8:05 PM  Result Value Ref Range Status   Specimen Description BLOOD LEFT HAND  Final   Special Requests IN PEDIATRIC BOTTLE Blood Culture adequate volume  Final   Culture NO GROWTH 2 DAYS  Final   Report Status PENDING  Incomplete  Culture, blood (Routine X 2) w Reflex to ID Panel     Status: None (Preliminary result)   Collection Time: 10/22/2017  8:05 PM  Result Value Ref Range Status   Specimen Description BLOOD LEFT ARM  Final   Special Requests IN PEDIATRIC BOTTLE Blood Culture adequate volume  Final   Culture NO GROWTH 2 DAYS  Final   Report Status PENDING  Incomplete       Radiology Studies: Dg Chest 2 View  Result Date: 10/18/2017 CLINICAL DATA:  Weakness and dehydration.  HIV disease EXAM: CHEST  2 VIEW COMPARISON:  October 18, 2017 FINDINGS: Lungs are clear. Heart size and pulmonary vascularity are normal. No adenopathy. No bone lesions. No pneumothorax. IMPRESSION: No edema or consolidation. Electronically Signed   By: Bretta BangWilliam  Woodruff III M.D.   On: 10/14/2017 12:25      Scheduled Meds: . bictegravir-emtricitabine-tenofovir AF  1 tablet Oral Daily  . feeding supplement  1 Container Oral TID BM  . feeding supplement (PRO-STAT SUGAR FREE 64)  30 mL Oral TID  . megestrol  400 mg Oral Daily  . multivitamin  5 mL Oral Daily  . potassium chloride  40 mEq Oral Q4H  . sodium chloride flush  3 mL Intravenous Q12H  . sulfamethoxazole-trimethoprim  1 tablet Oral Daily   Continuous Infusions: . sodium chloride    . sodium chloride 100 mL/hr at 10/24/17 2139  . sodium chloride    . magnesium sulfate 1 - 4 g bolus IVPB 2 g (10/25/17 1121)     LOS: 2 days    Time spent: 30 minutes   Noralee StainJennifer Samy Ryner, DO [facility] [email or web address] Password [email or web address] 10/25/2017, 12:14 PM

## 2017-10-26 ENCOUNTER — Inpatient Hospital Stay (HOSPITAL_COMMUNITY): Payer: Medicaid Other

## 2017-10-26 LAB — TYPE AND SCREEN
ABO/RH(D): B POS
ANTIBODY SCREEN: NEGATIVE
UNIT DIVISION: 0
UNIT DIVISION: 0
Unit division: 0

## 2017-10-26 LAB — BPAM RBC
BLOOD PRODUCT EXPIRATION DATE: 201811082359
BLOOD PRODUCT EXPIRATION DATE: 201811082359
Blood Product Expiration Date: 201811082359
ISSUE DATE / TIME: 201810261051
ISSUE DATE / TIME: 201810271959
ISSUE DATE / TIME: 201810271447
UNIT TYPE AND RH: 7300
UNIT TYPE AND RH: 7300
Unit Type and Rh: 7300

## 2017-10-26 LAB — CBC
HEMATOCRIT: 23.4 % — AB (ref 39.0–52.0)
HEMOGLOBIN: 8.3 g/dL — AB (ref 13.0–17.0)
MCH: 27.3 pg (ref 26.0–34.0)
MCHC: 35.5 g/dL (ref 30.0–36.0)
MCV: 77 fL — ABNORMAL LOW (ref 78.0–100.0)
Platelets: 82 10*3/uL — ABNORMAL LOW (ref 150–400)
RBC: 3.04 MIL/uL — ABNORMAL LOW (ref 4.22–5.81)
RDW: 18.1 % — ABNORMAL HIGH (ref 11.5–15.5)
WBC: 9.4 10*3/uL (ref 4.0–10.5)

## 2017-10-26 LAB — COMPREHENSIVE METABOLIC PANEL
ALT: 19 U/L (ref 17–63)
AST: 27 U/L (ref 15–41)
Albumin: <1 g/dL — ABNORMAL LOW (ref 3.5–5.0)
Alkaline Phosphatase: 229 U/L — ABNORMAL HIGH (ref 38–126)
Anion gap: 4 — ABNORMAL LOW (ref 5–15)
BILIRUBIN TOTAL: 2 mg/dL — AB (ref 0.3–1.2)
BUN: 17 mg/dL (ref 6–20)
CALCIUM: 6.3 mg/dL — AB (ref 8.9–10.3)
CO2: 13 mmol/L — ABNORMAL LOW (ref 22–32)
CREATININE: 0.86 mg/dL (ref 0.61–1.24)
Chloride: 111 mmol/L (ref 101–111)
GFR calc Af Amer: >60 mL/min (ref >60)
Glucose, Bld: 99 mg/dL (ref 65–99)
Potassium: 2.8 mmol/L — ABNORMAL LOW (ref 3.5–5.1)
Sodium: 128 mmol/L — ABNORMAL LOW (ref 135–145)
TOTAL PROTEIN: 4 g/dL — AB (ref 6.5–8.1)

## 2017-10-26 LAB — BASIC METABOLIC PANEL
ANION GAP: 7 (ref 5–15)
Anion gap: 5 (ref 5–15)
BUN: 17 mg/dL (ref 6–20)
BUN: 17 mg/dL (ref 6–20)
CALCIUM: 6.7 mg/dL — AB (ref 8.9–10.3)
CHLORIDE: 111 mmol/L (ref 101–111)
CO2: 14 mmol/L — AB (ref 22–32)
CO2: 12 mmol/L — AB (ref 22–32)
CREATININE: 0.78 mg/dL (ref 0.61–1.24)
Calcium: 6.2 mg/dL — CL (ref 8.9–10.3)
Chloride: 104 mmol/L (ref 101–111)
Creatinine, Ser: 0.95 mg/dL (ref 0.61–1.24)
GFR calc Af Amer: >60 mL/min (ref >60)
GFR calc Af Amer: >60 mL/min (ref >60)
GFR calc non Af Amer: >60 mL/min (ref >60)
GFR calc non Af Amer: >60 mL/min (ref >60)
GLUCOSE: 106 mg/dL — AB (ref 65–99)
Glucose, Bld: 102 mg/dL — ABNORMAL HIGH (ref 65–99)
POTASSIUM: 3.1 mmol/L — AB (ref 3.5–5.1)
Potassium: 2.7 mmol/L — CL (ref 3.5–5.1)
Sodium: 125 mmol/L — ABNORMAL LOW (ref 135–145)
Sodium: 128 mmol/L — ABNORMAL LOW (ref 135–145)

## 2017-10-26 LAB — CBC WITH DIFFERENTIAL/PLATELET
BASOS PCT: 0 %
Basophils Absolute: 0 10*3/uL (ref 0.0–0.1)
EOS PCT: 1 %
Eosinophils Absolute: 0.2 10*3/uL (ref 0.0–0.7)
HEMATOCRIT: 32 % — AB (ref 39.0–52.0)
Hemoglobin: 11.2 g/dL — ABNORMAL LOW (ref 13.0–17.0)
Lymphocytes Relative: 6 %
Lymphs Abs: 0.9 10*3/uL (ref 0.7–4.0)
MCH: 27.2 pg (ref 26.0–34.0)
MCHC: 35 g/dL (ref 30.0–36.0)
MCV: 77.7 fL — AB (ref 78.0–100.0)
MONO ABS: 1.4 10*3/uL — AB (ref 0.1–1.0)
Monocytes Relative: 9 %
NEUTROS ABS: 12.6 10*3/uL — AB (ref 1.7–7.7)
NEUTROS PCT: 84 %
Platelets: 106 10*3/uL — ABNORMAL LOW (ref 150–400)
RBC: 4.12 MIL/uL — ABNORMAL LOW (ref 4.22–5.81)
RDW: 18.4 % — AB (ref 11.5–15.5)
WBC: 15.1 10*3/uL — ABNORMAL HIGH (ref 4.0–10.5)

## 2017-10-26 LAB — LACTIC ACID, PLASMA
LACTIC ACID, VENOUS: 0.7 mmol/L (ref 0.5–1.9)
Lactic Acid, Venous: 0.7 mmol/L (ref 0.5–1.9)

## 2017-10-26 LAB — MAGNESIUM
Magnesium: 1.8 mg/dL (ref 1.7–2.4)
Magnesium: 1.6 mg/dL — ABNORMAL LOW (ref 1.7–2.4)

## 2017-10-26 LAB — PROCALCITONIN: Procalcitonin: 1.74 ng/mL

## 2017-10-26 LAB — PROTIME-INR
INR: 1.8
Prothrombin Time: 20.8 seconds — ABNORMAL HIGH (ref 11.4–15.2)

## 2017-10-26 LAB — APTT: aPTT: 41 seconds — ABNORMAL HIGH (ref 24–36)

## 2017-10-26 MED ORDER — POTASSIUM PHOSPHATES 15 MMOLE/5ML IV SOLN
40.0000 meq | Freq: Once | INTRAVENOUS | Status: AC
  Administered 2017-10-26: 40 meq via INTRAVENOUS
  Filled 2017-10-26: qty 9.09

## 2017-10-26 MED ORDER — SODIUM CHLORIDE 0.9 % IV SOLN
1.0000 g | Freq: Once | INTRAVENOUS | Status: AC
  Administered 2017-10-26: 1 g via INTRAVENOUS
  Filled 2017-10-26: qty 10

## 2017-10-26 MED ORDER — CHLORPROMAZINE HCL 25 MG PO TABS
25.0000 mg | ORAL_TABLET | Freq: Four times a day (QID) | ORAL | Status: AC | PRN
  Administered 2017-10-26 (×2): 25 mg via ORAL
  Filled 2017-10-26 (×3): qty 1

## 2017-10-26 MED ORDER — SODIUM CHLORIDE 0.9 % IV BOLUS (SEPSIS)
1000.0000 mL | Freq: Once | INTRAVENOUS | Status: AC
  Administered 2017-10-26: 1000 mL via INTRAVENOUS

## 2017-10-26 MED ORDER — MAGNESIUM SULFATE 2 GM/50ML IV SOLN
2.0000 g | Freq: Once | INTRAVENOUS | Status: AC
Start: 2017-10-26 — End: 2017-10-26
  Administered 2017-10-26: 2 g via INTRAVENOUS
  Filled 2017-10-26: qty 50

## 2017-10-26 MED ORDER — POTASSIUM CHLORIDE CRYS ER 20 MEQ PO TBCR
40.0000 meq | EXTENDED_RELEASE_TABLET | ORAL | Status: AC
  Administered 2017-10-26 (×2): 40 meq via ORAL
  Filled 2017-10-26: qty 2

## 2017-10-26 NOTE — Progress Notes (Addendum)
RN called about critical values K+2.7 and ca2+ 6.2; pt had not taken his po K+ supplement. Mg2+ also low.  K+, Ca2+ and mag2+ repleted intravenously; repeat CMP, mg2+ level, and phosphorous level in the am. Hypotensive but maintaining Map 63-65 with 125cc NS. HR 110-115, sinus tach.

## 2017-10-26 NOTE — Progress Notes (Signed)
PROGRESS NOTE    Verlan FriendsMarcus L Wiechmann  ZOX:[id number] DOB: [birth year] DOA: November 08, 2017 PCP: Patient, No Pcp Per     Brief Narrative:  Verlan FriendsMarcus L Zafar is a 31 yo male with past medical history of AIDS who presented from HIV clinic due to concern for diarrhea and dehydration. He was recently treated for diarrhea with Flagyl. However, he admits to having about 4-5 loose bowel movements daily. He denies any nausea or vomiting. He was admitted for further evaluation and treatment of chronic diarrhea, dehydration.   Assessment & Plan:   Principal Problem:   AIDS (acquired immune deficiency syndrome) (HCC) Active Problems:   Non-compliance   Protein-calorie malnutrition, severe   Diarrhea   Dehydration   Chronic diarrhea   Chronic diarrhea -C Diff negative, GI PCR panel negative -GI consulted, they recommend treatment for AIDS, no plan for endoscopy now  -ID checking CMV stool, AFB stool and culture   Sinus tachycardia -HR 140-150s this morning, EKG obtained which was reviewed independently, revealed sinus tachycardia. Patient has no overt cardiac symptoms. ?Infection vs continued dehydration. Patient has had very poor oral intake, ate 2 french fries and 1/2 chicken nugget per mom, had few bites of salad which he vomited last night. Will give IVF bolus today, start telemetry monitoring   Dehydration -IVF   GI bleed -Hgb 9 --> 6.5 (baseline Hgb 7-8). FOBT positive -S/p 1u pRBC 10/26, 2u pRBC 10/27  -GI consulted, no plan for endoscopy now  -Hgb stable   Hypovolemic hyponatremia -Continue normal saline IV fluid, trend BMP  -Check urine sodium, urine Cr   Hypokalemia -Replace, trend  AIDS -Recent CD4 12, viral load 611,000 -ID following  -Biktarvy for ART, bactrim for PCP ppx   Severe malnutrition in context of chronic illness -Dietitian consulted, continue to encourage oral intake    DVT prophylaxis: SCD Code Status: full Family Communication: mom at bedside Disposition  Plan: pending improvement, back home   Consultants:   ID  GI  Procedures:   None  Antimicrobials:  Anti-infectives    Start     Dose/Rate Route Frequency Ordered Stop   10/24/17 1000  sulfamethoxazole-trimethoprim (BACTRIM DS,SEPTRA DS) 800-160 MG per tablet 1 tablet  Status:  Discontinued     1 tablet Oral Daily October 18, 2017 1634 October 18, 2017 1635   10/24/17 1000  sulfamethoxazole-trimethoprim (BACTRIM DS,SEPTRA DS) 800-160 MG per tablet 1 tablet     1 tablet Oral Daily October 18, 2017 1635     10/24/17 1000  bictegravir-emtricitabine-tenofovir AF (BIKTARVY) 50-200-25 MG per tablet 1 tablet  Status:  Discontinued     1 tablet Oral Daily October 18, 2017 1701 October 18, 2017 1915   October 18, 2017 2030  bictegravir-emtricitabine-tenofovir AF (BIKTARVY) 50-200-25 MG per tablet 1 tablet    Comments:  Try to take at the same time each day with or without food.     1 tablet Oral Daily October 18, 2017 1854     October 18, 2017 1900  sulfamethoxazole-trimethoprim (BACTRIM,SEPTRA) 400-80 MG per tablet 1 tablet  Status:  Discontinued    Comments:  Patient taking differently: 30 day course filled 10/03/17     1 tablet Oral Daily October 18, 2017 1854 October 18, 2017 1915   October 18, 2017 1900  abacavir-dolutegravir-lamiVUDine (TRIUMEQ) 600-50-300 MG per tablet 1 tablet  Status:  Discontinued     1 tablet Oral Daily October 18, 2017 1854 October 18, 2017 1911       Subjective: No complaints this morning. Did not eat much yesterday. Has no appetite. Denies any chest pain, shortness of breath, fevers or chills, abdominal pain. Admits that his diarrhea  is slowing down, but continues to have loose bowel movements multiple times daily.   Objective: Vitals:   10/25/17 2202 10/25/17 2310 10/25/17 2311 10/26/17 0643  BP: 104/61 (!) 98/59 (!) 98/59 (!) 93/54  Pulse: (!) 131 (!) 123 (!) 125 (!) 142  Resp: (!) 24 20  (!) 21  Temp: 98.4 F (36.9 C) (!) 97.3 F (36.3 C) (!) 97.3 F (36.3 C) 98.7 F (37.1 C)  TempSrc: Oral Oral Oral Oral  SpO2: 100% 100% 100% 100%  Weight:        Height:        Intake/Output Summary (Last 24 hours) at 10/26/17 1101 Last data filed at 10/25/17 2312  Gross per 24 hour  Intake              640 ml  Output                0 ml  Net              640 ml   Filed Weights   10/02/2017 1900  Weight: 53.1 kg (117 lb)    Examination:  General exam: Appears calm and comfortable, thin  Respiratory system: Clear to auscultation. Respiratory effort normal. Cardiovascular system: S1 & S2 heard, tachycardic regular rhythm. No JVD, murmurs, rubs, gallops or clicks. No pedal edema. Gastrointestinal system: Abdomen is nondistended, soft and nontender. No organomegaly or masses felt. Normal bowel sounds heard. Central nervous system: Alert and oriented. No focal neurological deficits. Extremities: Symmetric 5 x 5 power. Skin: No rashes, lesions or ulcers Psychiatry: Judgement and insight appear normal. Mood & affect appropriate.   Data Reviewed: I have personally reviewed following labs and imaging studies  CBC:  Recent Labs Lab 10/03/2017 1138 10/24/17 0301 10/25/17 0637 10/26/17 0148  WBC 12.0* 8.4 11.5* 15.1*  NEUTROABS 9.9*  --  9.0* 12.6*  HGB 9.0* 6.5* 6.4* 11.2*  HCT 25.9* 18.7* 18.5* 32.0*  MCV 76.0* 75.7* 74.9* 77.7*  PLT 146* 120* 119* 106*   Basic Metabolic Panel:  Recent Labs Lab 10/14/2017 1138 10/24/17 0301 10/25/17 0637 10/26/17 0148  NA 124* 127* 127* 125*  K 3.0* 2.9* 3.0* 3.1*  CL 101 106 108 104  CO2 15* 15* 12* 14*  GLUCOSE 142* 115* 90 106*  BUN 28* 23* 18 17  CREATININE 1.24 0.96 1.00 0.95  CALCIUM 7.4* 6.7* 6.5* 6.7*  MG 1.9  --  1.4* 1.8   GFR: Estimated Creatinine Clearance: 84.6 mL/min (by C-G formula based on SCr of 0.95 mg/dL). Liver Function Tests:  Recent Labs Lab 10/05/2017 1138  AST 23  ALT 19  ALKPHOS 141*  BILITOT 1.9*  PROT 5.5*  ALBUMIN 1.5*   No results for input(s): LIPASE, AMYLASE in the last 168 hours. No results for input(s): AMMONIA in the last 168 hours. Coagulation  Profile: No results for input(s): INR, PROTIME in the last 168 hours. Cardiac Enzymes: No results for input(s): CKTOTAL, CKMB, CKMBINDEX, TROPONINI in the last 168 hours. BNP (last 3 results) No results for input(s): PROBNP in the last 8760 hours. HbA1C: No results for input(s): HGBA1C in the last 72 hours. CBG: No results for input(s): GLUCAP in the last 168 hours. Lipid Profile: No results for input(s): CHOL, HDL, LDLCALC, TRIG, CHOLHDL, LDLDIRECT in the last 72 hours. Thyroid Function Tests: No results for input(s): TSH, T4TOTAL, FREET4, T3FREE, THYROIDAB in the last 72 hours. Anemia Panel: No results for input(s): VITAMINB12, FOLATE, FERRITIN, TIBC, IRON, RETICCTPCT in the last 72 hours. Sepsis  Labs:  Recent Labs Lab 11-08-2017 1147 08-Nov-2017 1322  LATICACIDVEN 1.50 1.58    Recent Results (from the past 240 hour(s))  Blood culture (routine x 2)     Status: None   Collection Time: 10/18/17  6:00 PM  Result Value Ref Range Status   Specimen Description BLOOD LEFT ANTECUBITAL  Final   Special Requests   Final    BOTTLES DRAWN AEROBIC AND ANAEROBIC Blood Culture adequate volume   Culture NO GROWTH 5 DAYS  Final   Report Status 11/08/2017 FINAL  Final  Blood culture (routine x 2)     Status: None   Collection Time: 10/18/17  6:31 PM  Result Value Ref Range Status   Specimen Description BLOOD RIGHT HAND  Final   Special Requests IN PEDIATRIC BOTTLE Blood Culture adequate volume  Final   Culture NO GROWTH 5 DAYS  Final   Report Status 2017/11/08 FINAL  Final  Urine culture     Status: Abnormal   Collection Time: 10/18/17  7:20 PM  Result Value Ref Range Status   Specimen Description URINE, RANDOM  Final   Special Requests NONE  Final   Culture <10,000 COLONIES/mL (A)  Final   Report Status 10/20/2017 FINAL  Final  Urine Culture     Status: Abnormal   Collection Time: Nov 08, 2017  3:34 AM  Result Value Ref Range Status   Specimen Description URINE, CLEAN CATCH  Final    Special Requests Normal  Final   Culture <10,000 COLONIES/mL INSIGNIFICANT GROWTH (A)  Final   Report Status 10/25/2017 FINAL  Final  Culture, blood (routine x 2)     Status: None (Preliminary result)   Collection Time: 11/08/2017  1:00 PM  Result Value Ref Range Status   Specimen Description BLOOD RIGHT ANTECUBITAL  Final   Special Requests   Final    BOTTLES DRAWN AEROBIC AND ANAEROBIC Blood Culture adequate volume   Culture NO GROWTH 2 DAYS  Final   Report Status PENDING  Incomplete  Culture, blood (routine x 2)     Status: None (Preliminary result)   Collection Time: Nov 08, 2017  1:19 PM  Result Value Ref Range Status   Specimen Description BLOOD LEFT ANTECUBITAL  Final   Special Requests   Final    BOTTLES DRAWN AEROBIC AND ANAEROBIC Blood Culture adequate volume   Culture NO GROWTH 2 DAYS  Final   Report Status PENDING  Incomplete  C difficile quick scan w PCR reflex     Status: None   Collection Time: 2017/11/08  4:48 PM  Result Value Ref Range Status   C Diff antigen NEGATIVE NEGATIVE Final   C Diff toxin NEGATIVE NEGATIVE Final   C Diff interpretation No C. difficile detected.  Final  Gastrointestinal Panel by PCR , Stool     Status: None   Collection Time: 11-08-17  4:48 PM  Result Value Ref Range Status   Campylobacter species NOT DETECTED NOT DETECTED Final   Plesimonas shigelloides NOT DETECTED NOT DETECTED Final   Salmonella species NOT DETECTED NOT DETECTED Final   Yersinia enterocolitica NOT DETECTED NOT DETECTED Final   Vibrio species NOT DETECTED NOT DETECTED Final   Vibrio cholerae NOT DETECTED NOT DETECTED Final   Enteroaggregative E coli (EAEC) NOT DETECTED NOT DETECTED Final   Enteropathogenic E coli (EPEC) NOT DETECTED NOT DETECTED Final   Enterotoxigenic E coli (ETEC) NOT DETECTED NOT DETECTED Final   Shiga like toxin producing E coli (STEC) NOT DETECTED NOT DETECTED Final  Shigella/Enteroinvasive E coli (EIEC) NOT DETECTED NOT DETECTED Final   Cryptosporidium  NOT DETECTED NOT DETECTED Final   Cyclospora cayetanensis NOT DETECTED NOT DETECTED Final   Entamoeba histolytica NOT DETECTED NOT DETECTED Final   Giardia lamblia NOT DETECTED NOT DETECTED Final   Adenovirus F40/41 NOT DETECTED NOT DETECTED Final   Astrovirus NOT DETECTED NOT DETECTED Final   Norovirus GI/GII NOT DETECTED NOT DETECTED Final   Rotavirus A NOT DETECTED NOT DETECTED Final   Sapovirus (I, II, IV, and V) NOT DETECTED NOT DETECTED Final  Culture, blood (Routine X 2) w Reflex to ID Panel     Status: None (Preliminary result)   Collection Time: 11-17-17  8:05 PM  Result Value Ref Range Status   Specimen Description BLOOD LEFT HAND  Final   Special Requests IN PEDIATRIC BOTTLE Blood Culture adequate volume  Final   Culture NO GROWTH 2 DAYS  Final   Report Status PENDING  Incomplete  Culture, blood (Routine X 2) w Reflex to ID Panel     Status: None (Preliminary result)   Collection Time: November 17, 2017  8:05 PM  Result Value Ref Range Status   Specimen Description BLOOD LEFT ARM  Final   Special Requests IN PEDIATRIC BOTTLE Blood Culture adequate volume  Final   Culture NO GROWTH 2 DAYS  Final   Report Status PENDING  Incomplete       Radiology Studies: No results found.    Scheduled Meds: . bictegravir-emtricitabine-tenofovir AF  1 tablet Oral Daily  . feeding supplement  1 Container Oral TID BM  . feeding supplement (PRO-STAT SUGAR FREE 64)  30 mL Oral TID  . megestrol  400 mg Oral Daily  . multivitamin  5 mL Oral Daily  . sodium chloride flush  3 mL Intravenous Q12H  . sulfamethoxazole-trimethoprim  1 tablet Oral Daily   Continuous Infusions: . sodium chloride    . sodium chloride 125 mL/hr at 10/26/17 0910     LOS: 3 days    Time spent: 30 minutes   Noralee Stain, DO [facility] [email or web address] Password [email or web address] 10/26/2017, 11:01 AM

## 2017-10-26 NOTE — Progress Notes (Addendum)
Lab called this RN with two critical lab values. Pot 2.7 and calcium 6.2. MD made aware. Will continue to monitor and assess.   *Pt's family informed this RN that pt did not take the potassium pills that were given to him during the previous shift. This RN stayed with the pt at the bedside until he took all of the potassium pills which included three-20 meq tablets-MD made aware of this. MD ordered an additonal 40 meq infusion of IV pot phosphate x1, 2g of mag sulfate x1 and 1g of calcium gluconate x1.   Pt's BP had been soft overnight (90's/50's). Order obtained in the am for a 1L NS bolus x1. Vitals rechecked after completion of bolus and BP 90/52. Rechecked again 30 min later per MD request and was 92/49. Critical care physician, Newell CoralKristen Scatliffe came to assess pt to see if he was a candidate for the ICU, however she felt that he was hemodynamically stable and would not require pressors at this time. BP checked again at that time and was 96/57. Order then obtained by MD Margo AyeHall for transfer to stepdown ([facility] [place]).

## 2017-10-26 NOTE — Progress Notes (Addendum)
Notified night shift MD about pt's HR sustained in the 130's-140's, pt was asymptomatic-awaiting response from MD. Rapid response team made aware at 0706 as well-said they would be up to see the pt at the bedside. Will obtain an EKG and continue to monitor. Day shift RN, Maralyn SagoSarah updated on pt situation.  Day shift MD, Dr. Alvino Chapelhoi paged to make aware of situation as well. Will ctm.

## 2017-10-26 NOTE — Progress Notes (Signed)
Pt HR 130's sustained. BP 83/40, recheck 77/35. MD paged. Orders for 1L bolus. Will continue to monitor.

## 2017-10-27 ENCOUNTER — Other Ambulatory Visit: Payer: Self-pay | Admitting: Pharmacist

## 2017-10-27 ENCOUNTER — Encounter (HOSPITAL_COMMUNITY): Payer: Self-pay | Admitting: Radiology

## 2017-10-27 ENCOUNTER — Inpatient Hospital Stay (HOSPITAL_COMMUNITY): Payer: Medicaid Other

## 2017-10-27 DIAGNOSIS — B20 Human immunodeficiency virus [HIV] disease: Secondary | ICD-10-CM

## 2017-10-27 DIAGNOSIS — I959 Hypotension, unspecified: Secondary | ICD-10-CM

## 2017-10-27 LAB — CBC WITH DIFFERENTIAL/PLATELET
BASOS ABS: 0 10*3/uL (ref 0.0–0.1)
BASOS PCT: 0 %
Basophils Absolute: 0 10*3/uL (ref 0.0–0.1)
Basophils Relative: 0 %
EOS PCT: 1 %
EOS PCT: 2 %
Eosinophils Absolute: 0.1 10*3/uL (ref 0.0–0.7)
Eosinophils Absolute: 0.2 10*3/uL (ref 0.0–0.7)
HCT: 22 % — ABNORMAL LOW (ref 39.0–52.0)
HEMATOCRIT: 24.4 % — AB (ref 39.0–52.0)
HEMOGLOBIN: 7.8 g/dL — AB (ref 13.0–17.0)
Hemoglobin: 8.7 g/dL — ABNORMAL LOW (ref 13.0–17.0)
LYMPHS PCT: 9 %
Lymphocytes Relative: 9 %
Lymphs Abs: 0.8 10*3/uL (ref 0.7–4.0)
Lymphs Abs: 0.8 10*3/uL (ref 0.7–4.0)
MCH: 27.4 pg (ref 26.0–34.0)
MCH: 27.4 pg (ref 26.0–34.0)
MCHC: 35.7 g/dL (ref 30.0–36.0)
MCHC: 35.5 g/dL (ref 30.0–36.0)
MCV: 77 fL — AB (ref 78.0–100.0)
MCV: 77.2 fL — ABNORMAL LOW (ref 78.0–100.0)
MONO ABS: 0.8 10*3/uL (ref 0.1–1.0)
MONOS PCT: 8 %
Monocytes Absolute: 0.7 10*3/uL (ref 0.1–1.0)
Monocytes Relative: 9 %
NEUTROS PCT: 81 %
Neutro Abs: 7.5 10*3/uL (ref 1.7–7.7)
Neutro Abs: 6.8 10*3/uL (ref 1.7–7.7)
Neutrophils Relative %: 81 %
Platelets: 86 10*3/uL — ABNORMAL LOW (ref 150–400)
Platelets: 76 10*3/uL — ABNORMAL LOW (ref 150–400)
RBC: 3.17 MIL/uL — ABNORMAL LOW (ref 4.22–5.81)
RBC: 2.85 MIL/uL — AB (ref 4.22–5.81)
RDW: 18.2 % — AB (ref 11.5–15.5)
RDW: 18.3 % — ABNORMAL HIGH (ref 11.5–15.5)
WBC: 9.2 10*3/uL (ref 4.0–10.5)
WBC: 8.5 10*3/uL (ref 4.0–10.5)

## 2017-10-27 LAB — BASIC METABOLIC PANEL
ANION GAP: 4 — AB (ref 5–15)
BUN: 17 mg/dL (ref 6–20)
CALCIUM: 6.1 mg/dL — AB (ref 8.9–10.3)
CHLORIDE: 111 mmol/L (ref 101–111)
CO2: 12 mmol/L — ABNORMAL LOW (ref 22–32)
CREATININE: 0.82 mg/dL (ref 0.61–1.24)
GFR calc non Af Amer: >60 mL/min (ref >60)
Glucose, Bld: 107 mg/dL — ABNORMAL HIGH (ref 65–99)
POTASSIUM: 3.5 mmol/L (ref 3.5–5.1)
Sodium: 127 mmol/L — ABNORMAL LOW (ref 135–145)

## 2017-10-27 LAB — COMPREHENSIVE METABOLIC PANEL
ALT: 17 U/L (ref 17–63)
ANION GAP: 5 (ref 5–15)
AST: 25 U/L (ref 15–41)
Albumin: <1 g/dL — ABNORMAL LOW (ref 3.5–5.0)
Alkaline Phosphatase: 232 U/L — ABNORMAL HIGH (ref 38–126)
BUN: 17 mg/dL (ref 6–20)
CHLORIDE: 112 mmol/L — AB (ref 101–111)
CO2: 11 mmol/L — ABNORMAL LOW (ref 22–32)
Calcium: 6.3 mg/dL — CL (ref 8.9–10.3)
Creatinine, Ser: 0.81 mg/dL (ref 0.61–1.24)
GFR calc non Af Amer: >60 mL/min (ref >60)
Glucose, Bld: 100 mg/dL — ABNORMAL HIGH (ref 65–99)
POTASSIUM: 3.8 mmol/L (ref 3.5–5.1)
SODIUM: 128 mmol/L — AB (ref 135–145)
TOTAL PROTEIN: 3.7 g/dL — AB (ref 6.5–8.1)
Total Bilirubin: 1.5 mg/dL — ABNORMAL HIGH (ref 0.3–1.2)

## 2017-10-27 LAB — TROPONIN I: TROPONIN I: 0.03 ng/mL — AB (ref <0.03)

## 2017-10-27 LAB — LACTIC ACID, PLASMA: Lactic Acid, Venous: 1 mmol/L (ref 0.5–1.9)

## 2017-10-27 LAB — MAGNESIUM
MAGNESIUM: 1.7 mg/dL (ref 1.7–2.4)
MAGNESIUM: 1.7 mg/dL (ref 1.7–2.4)

## 2017-10-27 LAB — PHOSPHORUS
PHOSPHORUS: 4 mg/dL (ref 2.5–4.6)
PHOSPHORUS: 4.1 mg/dL (ref 2.5–4.6)

## 2017-10-27 MED ORDER — SODIUM CHLORIDE 0.9 % IV SOLN
1.0000 g | Freq: Once | INTRAVENOUS | Status: AC
  Administered 2017-10-27: 1 g via INTRAVENOUS
  Filled 2017-10-27: qty 10

## 2017-10-27 MED ORDER — SODIUM CHLORIDE 0.9 % IV BOLUS (SEPSIS)
1000.0000 mL | Freq: Once | INTRAVENOUS | Status: AC
  Administered 2017-10-27: 1000 mL via INTRAVENOUS

## 2017-10-27 MED ORDER — ABACAVIR-DOLUTEGRAVIR-LAMIVUD 600-50-300 MG PO TABS
1.0000 | ORAL_TABLET | Freq: Every day | ORAL | 3 refills | Status: AC
Start: 2017-10-27 — End: ?

## 2017-10-27 MED ORDER — PHENOL 1.4 % MT LIQD
1.0000 | OROMUCOSAL | Status: DC | PRN
  Filled 2017-10-27: qty 177

## 2017-10-27 MED ORDER — CALCIUM CARBONATE ANTACID 500 MG PO CHEW
1.0000 | CHEWABLE_TABLET | Freq: Every day | ORAL | Status: DC | PRN
  Administered 2017-10-27 – 2017-10-28 (×2): 200 mg via ORAL
  Filled 2017-10-27 (×2): qty 1

## 2017-10-27 MED ORDER — IOPAMIDOL (ISOVUE-300) INJECTION 61%: 100.0000 mL | Freq: Once | INTRAVENOUS | Status: DC | PRN

## 2017-10-27 MED ORDER — IOPAMIDOL (ISOVUE-300) INJECTION 61%
INTRAVENOUS | Status: AC
  Administered 2017-10-27: 15 mL via ORAL
  Filled 2017-10-27: qty 100

## 2017-10-27 MED ORDER — SODIUM CHLORIDE 0.9 % IV BOLUS (SEPSIS)
500.0000 mL | Freq: Once | INTRAVENOUS | Status: AC
  Administered 2017-10-28: 500 mL via INTRAVENOUS

## 2017-10-27 MED ORDER — IOPAMIDOL (ISOVUE-M 300) INJECTION 61%: 15.0000 mL | Freq: Once | INTRAMUSCULAR | Status: DC | PRN

## 2017-10-27 MED ORDER — SODIUM CHLORIDE 0.9 % IV SOLN
INTRAVENOUS | Status: DC
  Administered 2017-10-27 – 2017-10-29 (×4): via INTRAVENOUS

## 2017-10-27 MED ORDER — IOPAMIDOL (ISOVUE-300) INJECTION 61%
15.0000 mL | INTRAVENOUS | Status: AC
  Administered 2017-10-27: 15 mL via ORAL

## 2017-10-27 NOTE — Consult Note (Signed)
.. ..  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   SIGNIFICANT EVENTS  Hypotensive received 3L IVF within 24 hrs ( 2L bolus during day shift and 1 L overnight) Calcium 2 g IV- overnight Potassium IV replacement this evening and witnessed PO ingestion  STUDIES:  CT Chest Abd/Pelvis CXR EKG   HISTORY OF PRESENT ILLNESS:  31 yr old male with a PMHx of HIV/AIDS (Recent CD4 12, viral load 611,000) admitted on 2017/01/24 for Diarrhea ( h/o Giardia + and was treated with Flagyl at that time), dehydration, a fifty pound weight loss over 6 months and electrolyte imbalances including Hyponatremia, hypokalemia.  Prior to this admission he was seen in Sept 2018 after being non compliant with ART for 2 years. During hospital course pt had a drop in Hgb and was FOBT+(in the setting of frequent diarrhea) and received 1u pRBC 10/26, 2u pRBC 10/27 Being followed by GI (no plans for scope). Also has a poor appetite without complaints of dysphagia or odynophagia.  PCCM consulted on 10/29 due to hypotension and concern for need of vasopressors.  Pt received 2L IVF during day shift and 1L IVF tonight continues on enteric precautions LA 1.0 On my evaluation patient is not in distress able to converse in full sentences Sat 100% BP 96/57 MAP 68 - post IVF.   PAST MEDICAL HISTORY :   has a past medical history of AIDS (acquired immune deficiency syndrome) (HCC); Anemia; History of blood transfusion (09/25/2017); and HIV (human immunodeficiency virus infection) (HCC).  has a past surgical history that includes Fracture surgery; Wrist fracture surgery (Left); and ORIF finger / thumb fracture  (Right). Prior to Admission medications   Medication Sig Start Date End Date Taking? Authorizing Provider  feeding supplement (BOOST / RESOURCE BREEZE) LIQD Take 1 Container by mouth 3 (three) times daily between meals. Patient taking differently: Take 237 mLs by mouth daily.  10/02/17  Yes [name], [place]ann, DO  Multiple Vitamin (MULTIVITAMIN) LIQD Take 5 mLs by mouth daily. TLC life changes/ Nutriburst   Yes [provider]  sulfamethoxazole-trimethoprim (BACTRIM,SEPTRA) 400-80 MG tablet Take 1 tablet by mouth daily. Patient taking differently: Take 1 tablet by mouth daily. 30 day course filled 10/03/17 10/03/17  Yes [name], [name], DO  TRIUMEQ 600-50-300 MG tablet Take 1 tablet by mouth daily. 10/01/17  Yes [provider]  bictegravir-emtricitabine-tenofovir AF (BIKTARVY) 50-200-25 MG TABS tablet Take 1 tablet by mouth daily. Try to take at the same time each day with or without food. 2017/01/24   [name], [name], NP  megestrol (MEGACE ES) 625 MG/5ML suspension Take 5 mLs (625 mg total) by mouth daily. 2017/01/24   [name], [name], NP  pantoprazole (PROTONIX) 40 MG tablet Take 1 tablet (40 mg total) by mouth 2 (two) times daily. Patient not taking: Reported on 10/18/2017 10/02/17   [name], [name], DO   Allergies  Allergen Reactions  . Penicillins Hives    Has patient had a PCN reaction causing immediate rash, facial/tongue/throat swelling, SOB or lightheadedness with hypotension: Yes Has patient had a PCN reaction causing severe rash involving mucus membranes or skin necrosis: No Has patient had a PCN reaction that required hospitalization: No Has patient  had a PCN reaction occurring within the last 10 years: No If all of the above answers are "NO", then may proceed with Cephalosporin use.    FAMILY HISTORY:  family history is not on file. SOCIAL HISTORY:  reports that he has never smoked. He has never used smokeless tobacco. He reports that he drinks about 1.8 oz of  alcohol per week . He reports that he does not use drugs.  REVIEW OF SYSTEMS:  Bolded items are pertinent positives Constitutional: Negative for fever, chills, weight loss, malaise/fatigue and diaphoresis.  HENT: Negative for hearing loss, ear pain, nosebleeds, congestion, sore throat, neck pain, tinnitus and ear discharge.   Eyes: Negative for blurred vision, double vision, photophobia, pain, discharge and redness.  Respiratory: Negative for cough, hemoptysis, sputum production, shortness of breath, wheezing and stridor.   Cardiovascular: Negative for chest pain, palpitations, orthopnea, claudication, leg swelling and PND.  Gastrointestinal: Negative for heartburn, nausea, vomiting, abdominal pain, diarrhea, constipation, blood in stool and melena.  Genitourinary: Negative for dysuria, urgency, frequency, hematuria and flank pain.  Musculoskeletal: Negative for myalgias, back pain, joint pain and falls.  Skin: Negative for itching and rash.  Neurological: Negative for dizziness, tingling, tremors, sensory change, speech change, focal weakness, seizures, loss of consciousness, weakness and headaches.  Endo/Heme/Allergies: Negative for environmental allergies and polydipsia. Does not bruise/bleed easily.  SUBJECTIVE:   VITAL SIGNS: Temp:  [97.2 F (36.2 C)-98.7 F (37.1 C)] 97.2 F (36.2 C) (10/29 0132) Pulse Rate:  [110-142] 110 (10/29 0316) Resp:  [18-22] 18 (10/29 0345) BP: (77-96)/(35-60) 96/57 (10/29 0410) SpO2:  [100 %] 100 % (10/29 0345)  PHYSICAL EXAMINATION: General:  In no acute distress, thin male  Neuro:  Alert oriented with no focal deficits HEENT: normocephalic atraumatic no thrush observed  Cardiovascular:  Tachycardic S1 and S2 appreciated  Lungs: diminished at right base otherwise clear to auscultation bilaterally Abdomen: soft non-tender + BS Musculoskeletal:  No obvious deformities noted Skin: intact and dry, poor turgor   Recent Labs Lab 10/26/17 0148  10/26/17 1813 10/26/17 2058  NA 125* 128* 128*  K 3.1* 2.7* 2.8*  CL 104 111 111  CO2 14* 12* 13*  BUN 17 17 17   CREATININE 0.95 0.78 0.86  GLUCOSE 106* 102* 99    Recent Labs Lab 10/26/17 1813 10/26/17 2058 10/27/17 0332  HGB 8.3* 8.7* 7.8*  HCT 23.4* 24.4* 22.0*  WBC 9.4 9.2 8.5  PLT 82* 86* 76*   Dg Chest Port 1 View  Result Date: 10/26/2017 CLINICAL DATA:  Sepsis EXAM: PORTABLE CHEST 1 VIEW COMPARISON:  2017-11-19 FINDINGS: Mild right infrahilar atelectasis or infiltrate. Left lung is clear. Normal heart size. No pneumothorax. IMPRESSION: Mild right infrahilar atelectasis or mild infiltrate. Electronically Signed   By: Jasmine Pang M.D.   On: 10/26/2017 23:05    ASSESSMENT / RECOMMENDATIONS:  1. Hypotension - Severe Sepsis Not currently in shock and does not require vasopressors at this time LA<2, MAP consistently >60 Continue IVF Negative Troponin and sinus tachycardia on EKG Continue on telemetry monitoring  2. HIV/AIDS- last CD4 count 12 Viral load 611,000- continues on ART (Biktarvy) On  prophylaxis with Bactrim DS CXR clear with only mild right infrahilar atelectasis Blood Cultures on admission remain negative to date Pt has a h/o Giardia + and received Flagyl on past admission in Sept CMV pending Continue on Enteric precautions Continue to follow ID recs  3. Microcytic Anemia Patient received 3 units prbc transfusion between 10/26-10/27 Initial Hgb 9 possibly hemoconcentrated Expect  Hgb to drop due to IVF boluses in last 24 hrs- dilutional anemia FOBT+ but its in the context of frequent diarrhea Seen by GI no indication for scope at this time ID sent parvovirus.  4. Diarrhea and weight loss Possible secretory Diarrhea as opposed to infectious Pt has a poor appetite he was started on Megace along with protein supplements Poor nutritional status and decreased protein - BP will remain low  5. Electrolyte imbalances- secondary to chronic diarrhea -  recommend additional Calcium gluconate 2 g IV with magnesium 2 g IV And Potassium replacement for goal electrolytes Calcium >8, K+ 4 Magnesium> 2  At the time of this evaluation pt's BP hemodynamically stable. He is in no acute distress resting comfortable and arouses easily , father at bedside.    I, Dr Newell Coral have personally reviewed patient's available data, including medical history, events of note, physical examination and test results as part of my evaluation. I have discussed with resident/NP and other care providers such as pharmacist, RN and Hospitalist. The patient is critically ill with multiple organ systems affected but remains hemodynamically stable and requires high complexity decision making for assessment and support, frequent evaluation and titration of therapies, application of advanced monitoring technologies and extensive interpretation of multiple databases.  Pt not a candidate for ICU at this time and is being transferred to [facility]. Critical Care Time devoted to patient care services described in this note is 65  Minutes. This time reflects time of care of this signee Dr Newell Coral. This critical care time does not reflect procedure time, or teaching time or supervisory time but could involve care discussion time    DISPOSITION: at this time patient does not require ICU CC TIME: 65 mins PROGNOSIS: Guarded FAMILY: Father at bedside and Mother usually stays with patient   Signed Dr Newell Coral Pulmonary Critical Care Locums Pulmonary and Critical Care Medicine [facility]  10/27/2017, 4:17 AM

## 2017-10-27 NOTE — Progress Notes (Signed)
[place] for Infectious Disease  Date of Admission:  10/11/2017     Total days of antibiotics 0          Principal Problem:   AIDS (acquired immune deficiency syndrome) ([place]) Active Problems:   Non-compliance   Protein-calorie malnutrition, severe   Diarrhea   Dehydration   Chronic diarrhea   . bictegravir-emtricitabine-tenofovir AF  1 tablet Oral Daily  . feeding supplement  1 Container Oral TID BM  . feeding supplement (PRO-STAT SUGAR FREE 64)  30 mL Oral TID  . megestrol  400 mg Oral Daily  . multivitamin  5 mL Oral Daily  . sodium chloride flush  3 mL Intravenous Q12H  . sulfamethoxazole-trimethoprim  1 tablet Oral Daily    SUBJECTIVE: Diarrhea improved - no BM today thus far. Asking about new smaller pill for his HIV. Only complaint today is intermittent sharp pains in abdomen. Had salad last night and "picking at bits of things" today. Does not like the [employer].   Review of Systems: Review of Systems  Constitutional: Negative for chills and fever.  HENT: Negative for tinnitus.   Eyes: Negative for blurred vision and photophobia.  Respiratory: Negative for cough and sputum production.   Cardiovascular: Negative for chest pain and palpitations.  Gastrointestinal: Positive for abdominal pain. Negative for diarrhea, nausea and vomiting.  Genitourinary: Negative for dysuria.  Skin: Negative for rash.  Neurological: Negative for headaches.    Allergies  Allergen Reactions  . Penicillins Hives    Has patient had a PCN reaction causing immediate rash, facial/tongue/throat swelling, SOB or lightheadedness with hypotension: Yes Has patient had a PCN reaction causing severe rash involving mucus membranes or skin necrosis: No Has patient had a PCN reaction that required hospitalization: No Has patient had a PCN reaction occurring within the last 10 years: No If all of the above answers are "NO", then may proceed with Cephalosporin use.     OBJECTIVE: Vitals:   10/27/17 0410 10/27/17 0520 10/27/17 0829 10/27/17 1301  BP: (!) 96/57 (!) 95/57 110/68 107/72  Pulse:  (!) 111 (!) 128 (!) 125  Resp:  16 18 (!) 24  Temp:  98.4 F (36.9 C) (!) 97.4 F (36.3 C) 97.7 F (36.5 C)  TempSrc:  Oral Oral Oral  SpO2:  100% 100% 100%  Weight:      Height:       Body mass index is 18.88 kg/m.  Physical Exam  Constitutional: He is oriented to person, place, and time. He appears cachectic. He has a sickly appearance.  Eyes: No scleral icterus.  Cardiovascular: Regular rhythm and normal heart sounds.  Tachycardia present.   No murmur heard. Pulmonary/Chest: Effort normal and breath sounds normal.  Abdominal: Soft. Bowel sounds are normal. He exhibits no distension. There is tenderness.  Lymphadenopathy:    He has no cervical adenopathy.  Neurological: He is alert and oriented to person, place, and time.  Skin: Skin is warm and dry.  Psychiatric: Affect normal.    Lab Results Lab Results  Component Value Date   WBC 8.5 10/27/2017   HGB 7.8 (L) 10/27/2017   HCT 22.0 (L) 10/27/2017   MCV 77.2 (L) 10/27/2017   PLT 76 (L) 10/27/2017    Lab Results  Component Value Date   CREATININE 0.81 10/27/2017   BUN 17 10/27/2017   NA 128 (L) 10/27/2017   K 3.8 10/27/2017   CL 112 (H) 10/27/2017   CO2 11 (  L) 10/27/2017    Lab Results  Component Value Date   ALT 17 10/27/2017   AST 25 10/27/2017   ALKPHOS 232 (H) 10/27/2017   BILITOT 1.5 (H) 10/27/2017     Microbiology: BCx 10/25 >> NG x 4d  BCx 10/28 >> pending Bone Marrow Cx 10/1 >> negative AFB CDiff Ag 10/25 >> neg GI PCR Panel 10/25 >> negative   ASSESSMENT: 31 yo AA male with advanced HIV/AIDS with chronic diarrhea and severe malnutrition. 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 He has not been weighted since admission and not documented as to how well he is eating his meals. He does  not like the [employer] supplement and requesting alternative.   PLAN: 1. Will re-check CT abdomen/pelvis for lymphadenopathy in abdomen.  2. Will discuss nutrition with primary team - he may need to be fed enterally for a short course.  3. Await CMV PCR viral load - if strongly positive may need to reconsider colonoscopy for biopsy vs consider empiric treatment for CMV colitis.  4. May trial empiric tx for MAC despite negative culture data thus far - will d/w Dr. Baxter Flattery.  5. Continue Biktarvy and Bactrim daily.    Janene Madeira, MSN, NP-C [facility] for Infect[facility] Pager: [phone]  10/27/2017  4:26 PM

## 2017-10-27 NOTE — Progress Notes (Signed)
This MD spoke with CCM consultant. Per consultant, the pt is hemodynamically stable and will not require pressors at this time. Consultant will consider critical care unit admission if lactic acid worsens.

## 2017-10-27 NOTE — Progress Notes (Signed)
[facility] d/t low BP/high HR despite multiple boluses of NS, pt and family made aware of transfer and reason for it. Pt transferred to [facility], room 7 at 05:06 am. Report given to [place], [place]. Pt's belongings brought with pt as well, pt's father present at transfer. Pt in no acute distress at time of transfer.

## 2017-10-27 NOTE — Progress Notes (Signed)
Date and time results received: 10/27/17 07:42 (use smartphrase ".now" to insert current time)  Test: Ca Critical Value: 6.3  Name of Provider Notified: Dr. Alvino Chapelhoi  Orders Received? Or Actions Taken?:

## 2017-10-27 NOTE — Progress Notes (Signed)
This MD consulted CCM due to persistent hypotension and tachycardia not responding to IV fluid boluses. Pt maintaining MAP barely above 60. On maintenance IV fluid 125cc/hr. Will move pt to stepdown unit for close monitoring of BP and HR. EKG done reveals sinus tach 119. Suspect sepsis in the setting of severe immunosuppression 2/2 to AIDS. May need pressors. CCM provider made aware and will put the patient on the list.

## 2017-10-27 NOTE — Progress Notes (Signed)
Patient transferred from [facility] with Decreased BP, Dehydration and elevated HR. CHG bat done. CCMD called and verified. Skin assessments times two nures. Family at bedside.

## 2017-10-27 NOTE — Care Management Note (Signed)
Case Management Note  Patient Details  Name: Anthony Yang MRN: [id number] Date of Birth: October 21, 1986  Subjective/Objective:       Admitted with diarrhea/dehydration,FTT, history of AIDS, recently treated for diarrhea. Resides with parents.    [name] (Mother) Bunnie PhilipsRobert Mitch (Father)    [phone]     PCP: pt with no PCP,however, post hospital follow up for is [facility]   Action/Plan: ID, GI following... CM to f/u with disposition needs.  Expected Discharge Date:                  Expected Discharge Plan:  Home/Self Care (Resides with parents)  In-House Referral:     Discharge planning Services  CM Consult, Follow-up appt scheduled  Post Acute Care Choice:    Choice offered to:     DME Arranged:    DME Agency:     HH Arranged:    HH Agency:     Status of Service:  In process, will continue to follow  If discussed at Long Length of Stay Meetings, dates discussed:    Additional Comments:  Epifanio LeschesCole, Kareemah Grounds [place], RN 10/27/2017, 1:39 PM

## 2017-10-27 NOTE — Progress Notes (Signed)
PROGRESS NOTE    AIKAM Yang  ZOX:[id number] DOB: 1986-10-05 DOA: 10/03/2017 PCP: Patient, No Pcp Per     Brief Narrative:  Anthony Yang is a 31 yo male with past medical history of AIDS who presented from HIV clinic due to concern for diarrhea and dehydration. He was recently treated for diarrhea with Flagyl. However, he admits to having about 4-5 loose bowel movements daily. He denies any nausea or vomiting. He was admitted for further evaluation and treatment of chronic diarrhea, dehydration.   Assessment & Plan:   Principal Problem:   AIDS (acquired immune deficiency syndrome) (HCC) Active Problems:   Non-compliance   Protein-calorie malnutrition, severe   Diarrhea   Dehydration   Chronic diarrhea   Chronic diarrhea -C Diff negative, GI PCR panel negative -GI consulted, they recommend treatment for AIDS, no plan for endoscopy now  -ID checking CMV stool, AFB stool and culture - pending  -Per patient report, diarrhea seems to be improving in frequency (documented 16 episodes 10/26, 6 episodes 10/27, 10 episodes 10/28)  Hypotension with volume depletion -Required multiple fluid boluses overnight due to hypotension. He was transferred to stepdown unit with CCM consult. He did not require pressors. Lactic acid remained in normal limits. Patient's blood pressure has improved. This morning, blood pressure 110/68. He remains asymptomatic.  -Continue IVF  GI bleed -Hgb 9 --> 6.5 (baseline Hgb 7-8). FOBT positive -S/p 1u pRBC 10/26, 2u pRBC 10/27  -GI consulted, no plan for endoscopy now  -Hgb stable   Hypovolemic hyponatremia -Continue normal saline IV fluid, trend BMP  -Check urine sodium, urine Cr   Nonanion gap metabolic acidosis -Secondary to diarrhea -Monitor BMP   AIDS -Recent CD4 12, viral load 611,000 -ID following  -Biktarvy for ART, bactrim for PCP ppx   Severe malnutrition in context of chronic illness -Dietitian consulted, continue to encourage oral  intake    DVT prophylaxis: SCD Code Status: full Family Communication: family at bedside Disposition Plan: pending improvement, back home   Consultants:   ID  GI  Procedures:   None  Antimicrobials:  Anti-infectives    Start     Dose/Rate Route Frequency Ordered Stop   10/24/17 1000  sulfamethoxazole-trimethoprim (BACTRIM DS,SEPTRA DS) 800-160 MG per tablet 1 tablet  Status:  Discontinued     1 tablet Oral Daily 10/25/2017 1634 10/22/2017 1635   10/24/17 1000  sulfamethoxazole-trimethoprim (BACTRIM DS,SEPTRA DS) 800-160 MG per tablet 1 tablet     1 tablet Oral Daily 10/22/2017 1635     10/24/17 1000  bictegravir-emtricitabine-tenofovir AF (BIKTARVY) 50-200-25 MG per tablet 1 tablet  Status:  Discontinued     1 tablet Oral Daily 10/21/2017 1701 10/06/2017 1915   10/07/2017 2030  bictegravir-emtricitabine-tenofovir AF (BIKTARVY) 50-200-25 MG per tablet 1 tablet    Comments:  Try to take at the same time each day with or without food.     1 tablet Oral Daily 10/29/2017 1854     10/15/2017 1900  sulfamethoxazole-trimethoprim (BACTRIM,SEPTRA) 400-80 MG per tablet 1 tablet  Status:  Discontinued    Comments:  Patient taking differently: 30 day course filled 10/03/17     1 tablet Oral Daily 10/19/2017 1854 10/26/2017 1915   10/06/2017 1900  abacavir-dolutegravir-lamiVUDine (TRIUMEQ) 600-50-300 MG per tablet 1 tablet  Status:  Discontinued     1 tablet Oral Daily 10/29/2017 1854 10/03/2017 1911       Subjective: No complaints this morning. He denies any dizziness or lightheadedness, no chest pain or shortness of  breath. He states that he had midline abdominal tenderness last night that has now resolved. States his diarrhea is a little bit better.   Objective: Vitals:   10/27/17 0345 10/27/17 0410 10/27/17 0520 10/27/17 0829  BP: (!) 92/49 (!) 96/57 (!) 95/57 110/68  Pulse:   (!) 111 (!) 128  Resp: 18  16 18   Temp:   98.4 F (36.9 C) (!) 97.4 F (36.3 C)  TempSrc:   Oral Oral  SpO2: 100%  100% 100%   Weight:      Height:        Intake/Output Summary (Last 24 hours) at 10/27/17 1116 Last data filed at 10/27/17 0900  Gross per 24 hour  Intake              740 ml  Output              400 ml  Net              340 ml   Filed Weights   November 08, 2017 1900  Weight: 53.1 kg (117 lb)    Examination:  General exam: Appears calm and comfortable, thin  Respiratory system: Clear to auscultation. Respiratory effort normal. Cardiovascular system: S1 & S2 heard, tachycardic regular rhythm. No JVD, murmurs, rubs, gallops or clicks. No pedal edema. Gastrointestinal system: Abdomen is nondistended, soft and nontender. No organomegaly or masses felt. Normal bowel sounds heard. Central nervous system: Alert and oriented. No focal neurological deficits. Extremities: Symmetric 5 x 5 power. Skin: No rashes, lesions or ulcers Psychiatry: Judgement and insight appear normal. Mood & affect appropriate.   Data Reviewed: I have personally reviewed following labs and imaging studies  CBC:  Recent Labs Lab 2017/11/08 1138  10/25/17 0637 10/26/17 0148 10/26/17 1813 10/26/17 2058 10/27/17 0332  WBC 12.0*  < > 11.5* 15.1* 9.4 9.2 8.5  NEUTROABS 9.9*  --  9.0* 12.6*  --  7.5 6.8  HGB 9.0*  < > 6.4* 11.2* 8.3* 8.7* 7.8*  HCT 25.9*  < > 18.5* 32.0* 23.4* 24.4* 22.0*  MCV 76.0*  < > 74.9* 77.7* 77.0* 77.0* 77.2*  PLT 146*  < > 119* 106* 82* 86* 76*  < > = values in this interval not displayed. Basic Metabolic Panel:  Recent Labs Lab 10/25/17 0637 10/26/17 0148 10/26/17 1813 10/26/17 2058 10/27/17 0332 10/27/17 0540  NA 127* 125* 128* 128* 127* 128*  K 3.0* 3.1* 2.7* 2.8* 3.5 3.8  CL 108 104 111 111 111 112*  CO2 12* 14* 12* 13* 12* 11*  GLUCOSE 90 106* 102* 99 107* 100*  BUN 18 17 17 17 17 17   CREATININE 1.00 0.95 0.78 0.86 0.82 0.81  CALCIUM 6.5* 6.7* 6.2* 6.3* 6.1* 6.3*  MG 1.4* 1.8 1.6*  --  1.7 1.7  PHOS  --   --   --   --  4.0 4.1   GFR: Estimated Creatinine Clearance: 99.2 mL/min (by  C-G formula based on SCr of 0.81 mg/dL). Liver Function Tests:  Recent Labs Lab 08-Nov-2017 1138 10/26/17 2058 10/27/17 0540  AST 23 27 25   ALT 19 19 17   ALKPHOS 141* 229* 232*  BILITOT 1.9* 2.0* 1.5*  PROT 5.5* 4.0* 3.7*  ALBUMIN 1.5* <1.0* <1.0*   No results for input(s): LIPASE, AMYLASE in the last 168 hours. No results for input(s): AMMONIA in the last 168 hours. Coagulation Profile:  Recent Labs Lab 10/26/17 2058  INR 1.80   Cardiac Enzymes:  Recent Labs Lab 10/27/17 0332  TROPONINI  0.03*   BNP (last 3 results) No results for input(s): PROBNP in the last 8760 hours. HbA1C: No results for input(s): HGBA1C in the last 72 hours. CBG: No results for input(s): GLUCAP in the last 168 hours. Lipid Profile: No results for input(s): CHOL, HDL, LDLCALC, TRIG, CHOLHDL, LDLDIRECT in the last 72 hours. Thyroid Function Tests: No results for input(s): TSH, T4TOTAL, FREET4, T3FREE, THYROIDAB in the last 72 hours. Anemia Panel: No results for input(s): VITAMINB12, FOLATE, FERRITIN, TIBC, IRON, RETICCTPCT in the last 72 hours. Sepsis Labs:  Recent Labs Lab 10/11/2017 1322 10/26/17 1813 10/26/17 2058 10/26/17 2351  PROCALCITON  --   --  1.74  --   LATICACIDVEN 1.58 0.7 0.7 1.0    Recent Results (from the past 240 hour(s))  Blood culture (routine x 2)     Status: None   Collection Time: 10/18/17  6:00 PM  Result Value Ref Range Status   Specimen Description BLOOD LEFT ANTECUBITAL  Final   Special Requests   Final    BOTTLES DRAWN AEROBIC AND ANAEROBIC Blood Culture adequate volume   Culture NO GROWTH 5 DAYS  Final   Report Status 10/19/2017 FINAL  Final  Blood culture (routine x 2)     Status: None   Collection Time: 10/18/17  6:31 PM  Result Value Ref Range Status   Specimen Description BLOOD RIGHT HAND  Final   Special Requests IN PEDIATRIC BOTTLE Blood Culture adequate volume  Final   Culture NO GROWTH 5 DAYS  Final   Report Status 10/25/2017 FINAL  Final  Urine  culture     Status: Abnormal   Collection Time: 10/18/17  7:20 PM  Result Value Ref Range Status   Specimen Description URINE, RANDOM  Final   Special Requests NONE  Final   Culture <10,000 COLONIES/mL (A)  Final   Report Status 10/20/2017 FINAL  Final  Urine Culture     Status: Abnormal   Collection Time: 09/30/2017  3:34 AM  Result Value Ref Range Status   Specimen Description URINE, CLEAN CATCH  Final   Special Requests Normal  Final   Culture <10,000 COLONIES/mL INSIGNIFICANT GROWTH (A)  Final   Report Status 10/25/2017 FINAL  Final  Culture, blood (routine x 2)     Status: None (Preliminary result)   Collection Time: 10/02/2017  1:00 PM  Result Value Ref Range Status   Specimen Description BLOOD RIGHT ANTECUBITAL  Final   Special Requests   Final    BOTTLES DRAWN AEROBIC AND ANAEROBIC Blood Culture adequate volume   Culture NO GROWTH 3 DAYS  Final   Report Status PENDING  Incomplete  Culture, blood (routine x 2)     Status: None (Preliminary result)   Collection Time: 10/21/2017  1:19 PM  Result Value Ref Range Status   Specimen Description BLOOD LEFT ANTECUBITAL  Final   Special Requests   Final    BOTTLES DRAWN AEROBIC AND ANAEROBIC Blood Culture adequate volume   Culture NO GROWTH 3 DAYS  Final   Report Status PENDING  Incomplete  C difficile quick scan w PCR reflex     Status: None   Collection Time: 10/28/2017  4:48 PM  Result Value Ref Range Status   C Diff antigen NEGATIVE NEGATIVE Final   C Diff toxin NEGATIVE NEGATIVE Final   C Diff interpretation No C. difficile detected.  Final  Gastrointestinal Panel by PCR , Stool     Status: None   Collection Time: 10/03/2017  4:48 PM  Result Value Ref Range Status   Campylobacter species NOT DETECTED NOT DETECTED Final   Plesimonas shigelloides NOT DETECTED NOT DETECTED Final   Salmonella species NOT DETECTED NOT DETECTED Final   Yersinia enterocolitica NOT DETECTED NOT DETECTED Final   Vibrio species NOT DETECTED NOT DETECTED  Final   Vibrio cholerae NOT DETECTED NOT DETECTED Final   Enteroaggregative E coli (EAEC) NOT DETECTED NOT DETECTED Final   Enteropathogenic E coli (EPEC) NOT DETECTED NOT DETECTED Final   Enterotoxigenic E coli (ETEC) NOT DETECTED NOT DETECTED Final   Shiga like toxin producing E coli (STEC) NOT DETECTED NOT DETECTED Final   Shigella/Enteroinvasive E coli (EIEC) NOT DETECTED NOT DETECTED Final   Cryptosporidium NOT DETECTED NOT DETECTED Final   Cyclospora cayetanensis NOT DETECTED NOT DETECTED Final   Entamoeba histolytica NOT DETECTED NOT DETECTED Final   Giardia lamblia NOT DETECTED NOT DETECTED Final   Adenovirus F40/41 NOT DETECTED NOT DETECTED Final   Astrovirus NOT DETECTED NOT DETECTED Final   Norovirus GI/GII NOT DETECTED NOT DETECTED Final   Rotavirus A NOT DETECTED NOT DETECTED Final   Sapovirus (I, II, IV, and V) NOT DETECTED NOT DETECTED Final  Culture, blood (Routine X 2) w Reflex to ID Panel     Status: None (Preliminary result)   Collection Time: 10/26/2017  8:05 PM  Result Value Ref Range Status   Specimen Description BLOOD LEFT HAND  Final   Special Requests IN PEDIATRIC BOTTLE Blood Culture adequate volume  Final   Culture NO GROWTH 3 DAYS  Final   Report Status PENDING  Incomplete  Culture, blood (Routine X 2) w Reflex to ID Panel     Status: None (Preliminary result)   Collection Time: 10/20/2017  8:05 PM  Result Value Ref Range Status   Specimen Description BLOOD LEFT ARM  Final   Special Requests IN PEDIATRIC BOTTLE Blood Culture adequate volume  Final   Culture NO GROWTH 3 DAYS  Final   Report Status PENDING  Incomplete       Radiology Studies: Dg Chest Port 1 View  Result Date: 10/26/2017 CLINICAL DATA:  Sepsis EXAM: PORTABLE CHEST 1 VIEW COMPARISON:  09/29/2017 FINDINGS: Mild right infrahilar atelectasis or infiltrate. Left lung is clear. Normal heart size. No pneumothorax. IMPRESSION: Mild right infrahilar atelectasis or mild infiltrate. Electronically  Signed   By: Jasmine Pang M.D.   On: 10/26/2017 23:05      Scheduled Meds: . bictegravir-emtricitabine-tenofovir AF  1 tablet Oral Daily  . feeding supplement  1 Container Oral TID BM  . feeding supplement (PRO-STAT SUGAR FREE 64)  30 mL Oral TID  . megestrol  400 mg Oral Daily  . multivitamin  5 mL Oral Daily  . sodium chloride flush  3 mL Intravenous Q12H  . sulfamethoxazole-trimethoprim  1 tablet Oral Daily   Continuous Infusions: . sodium chloride    . sodium chloride 125 mL/hr at 10/27/17 0857     LOS: 4 days    Time spent: 30 minutes   Noralee Stain, DO [facility] [email or web address] Password [email or web address] 10/27/2017, 11:16 AM

## 2017-10-28 LAB — CBC WITH DIFFERENTIAL/PLATELET
BASOS PCT: 0 %
Basophils Absolute: 0 10*3/uL (ref 0.0–0.1)
EOS ABS: 0 10*3/uL (ref 0.0–0.7)
Eosinophils Relative: 0 %
HCT: 24.1 % — ABNORMAL LOW (ref 39.0–52.0)
Hemoglobin: 8.6 g/dL — ABNORMAL LOW (ref 13.0–17.0)
LYMPHS ABS: 1.1 10*3/uL (ref 0.7–4.0)
LYMPHS PCT: 10 %
MCH: 27.7 pg (ref 26.0–34.0)
MCHC: 35.7 g/dL (ref 30.0–36.0)
MCV: 77.5 fL — AB (ref 78.0–100.0)
MONO ABS: 1.1 10*3/uL — AB (ref 0.1–1.0)
Monocytes Relative: 10 %
NEUTROS ABS: 8.4 10*3/uL — AB (ref 1.7–7.7)
Neutrophils Relative %: 80 %
PLATELETS: 78 10*3/uL — AB (ref 150–400)
RBC: 3.11 MIL/uL — ABNORMAL LOW (ref 4.22–5.81)
RDW: 19 % — ABNORMAL HIGH (ref 11.5–15.5)
WBC: 10.6 10*3/uL — ABNORMAL HIGH (ref 4.0–10.5)

## 2017-10-28 LAB — CULTURE, BLOOD (ROUTINE X 2)
CULTURE: NO GROWTH
CULTURE: NO GROWTH
Culture: NO GROWTH
Culture: NO GROWTH
SPECIAL REQUESTS: ADEQUATE
SPECIAL REQUESTS: ADEQUATE
Special Requests: ADEQUATE
Special Requests: ADEQUATE

## 2017-10-28 LAB — BASIC METABOLIC PANEL
ANION GAP: 4 — AB (ref 5–15)
BUN: 18 mg/dL (ref 6–20)
CALCIUM: 6.4 mg/dL — AB (ref 8.9–10.3)
CO2: 11 mmol/L — AB (ref 22–32)
Chloride: 112 mmol/L — ABNORMAL HIGH (ref 101–111)
Creatinine, Ser: 0.84 mg/dL (ref 0.61–1.24)
GFR calc Af Amer: >60 mL/min (ref >60)
GFR calc non Af Amer: >60 mL/min (ref >60)
GLUCOSE: 102 mg/dL — AB (ref 65–99)
Potassium: 3.5 mmol/L (ref 3.5–5.1)
Sodium: 127 mmol/L — ABNORMAL LOW (ref 135–145)

## 2017-10-28 LAB — CMV DNA, QUANTITATIVE, PCR
CMV DNA Quant: 35280 IU/mL
LOG10 CMV QN DNA PL: 4.548 {Log_IU}/mL

## 2017-10-28 LAB — MAGNESIUM: Magnesium: 1.4 mg/dL — ABNORMAL LOW (ref 1.7–2.4)

## 2017-10-28 LAB — HUMAN PARVOVIRUS DNA DETECTION BY PCR: PARVOVIRUS B19 PCR: NEGATIVE

## 2017-10-28 MED ORDER — MAGNESIUM SULFATE 2 GM/50ML IV SOLN
2.0000 g | Freq: Once | INTRAVENOUS | Status: AC
  Administered 2017-10-28: 2 g via INTRAVENOUS
  Filled 2017-10-28: qty 50

## 2017-10-28 MED ORDER — ALBENDAZOLE 200 MG PO TABS
400.0000 mg | ORAL_TABLET | Freq: Two times a day (BID) | ORAL | Status: DC
  Administered 2017-10-28 – 2017-10-29 (×2): 400 mg via ORAL
  Filled 2017-10-28 (×5): qty 2

## 2017-10-28 MED ORDER — METOCLOPRAMIDE HCL 5 MG/ML IJ SOLN
5.0000 mg | Freq: Three times a day (TID) | INTRAMUSCULAR | Status: DC | PRN
  Administered 2017-10-28 – 2017-11-04 (×8): 5 mg via INTRAVENOUS
  Filled 2017-10-28 (×8): qty 2

## 2017-10-28 MED ORDER — POTASSIUM CHLORIDE CRYS ER 20 MEQ PO TBCR
40.0000 meq | EXTENDED_RELEASE_TABLET | Freq: Once | ORAL | Status: AC
  Administered 2017-10-28: 40 meq via ORAL
  Filled 2017-10-28: qty 2

## 2017-10-28 MED ORDER — AZITHROMYCIN 600 MG PO TABS
1200.0000 mg | ORAL_TABLET | ORAL | Status: DC
  Administered 2017-10-28 – 2017-11-04 (×2): 1200 mg via ORAL
  Filled 2017-10-28 (×2): qty 2

## 2017-10-28 MED ORDER — SIMETHICONE 80 MG PO CHEW
80.0000 mg | CHEWABLE_TABLET | Freq: Four times a day (QID) | ORAL | Status: DC | PRN
  Administered 2017-10-29 – 2017-11-11 (×2): 80 mg via ORAL
  Filled 2017-10-28 (×2): qty 1

## 2017-10-28 MED ORDER — SODIUM CHLORIDE 0.9 % IV SOLN
1.0000 g | Freq: Once | INTRAVENOUS | Status: AC
  Administered 2017-10-28: 1 g via INTRAVENOUS
  Filled 2017-10-28: qty 10

## 2017-10-28 NOTE — Progress Notes (Signed)
CRITICAL VALUE ALERT  Critical Value:  Calcium  Date & Time Notied:10/28/17 0607 Provider Notified: Schorr Orders Received/Actions taken: Waiting for call back

## 2017-10-28 NOTE — Progress Notes (Signed)
[facility] for Infectious [facility]    Date of Admission:  10/06/2017      ID: Anthony Yang is a 31 y.o. male with advanced hiv disease, poorly controlled, with AIDS wasting and chronic diarrhea Principal Problem:   Chronic diarrhea Active Problems:   AIDS (acquired immune deficiency syndrome) (HCC)   Non-compliance   Protein-calorie malnutrition, severe   Diarrhea   Dehydration    Subjective: He states that they attempted bedside NG placement without success. He is noticing less diarrhea today. His tray looks untouched from lunch  He underwent abd CT yesterday. Per my read appears to have ascites  Medications:  . albendazole  400 mg Oral BID WC  . azithromycin  1,200 mg Oral Weekly  . bictegravir-emtricitabine-tenofovir AF  1 tablet Oral Daily  . feeding supplement  1 Container Oral TID BM  . feeding supplement (PRO-STAT SUGAR FREE 64)  30 mL Oral TID  . megestrol  400 mg Oral Daily  . multivitamin  5 mL Oral Daily  . sodium chloride flush  3 mL Intravenous Q12H  . sulfamethoxazole-trimethoprim  1 tablet Oral Daily    Objective: Vital signs in last 24 hours: Temp:  [97.8 F (36.6 C)-99.5 F (37.5 C)] 97.8 F (36.6 C) (10/30 1145) Pulse Rate:  [118-146] 131 (10/30 1145) Resp:  [21-35] 27 (10/30 1145) BP: (102-120)/(72-82) 108/75 (10/30 1145) SpO2:  [100 %] 100 % (10/30 1145) Physical Exam  Constitutional: He is oriented to person, place, and time. He appears cachetic, severely malnourished. No distress.  HENT: facial muscle wasting, inc bitemporal wasting Mouth/Throat: Oropharynx is clear and moist. No oropharyngeal exudate.  Cardiovascular: tachycardic. Nl s1,s2 Pulmonary/Chest: Effort normal and breath sounds normal. No respiratory distress. He has no wheezes.  Abdominal: Soft. Bowel sounds are normal. He exhibits no distension. There is no tenderness.  Lymphadenopathy:  He has no cervical adenopathy.  Neurological: He is alert and oriented to person,  place, and time.  Skin: Skin is warm and dry. No rash noted. No erythema.  Psychiatric: He has a normal mood and affect. His behavior is normal.    Lab Results  Recent Labs  10/27/17 0332 10/27/17 0540 10/28/17 0405  WBC 8.5  --  10.6*  HGB 7.8*  --  8.6*  HCT 22.0*  --  24.1*  NA 127* 128* 127*  K 3.5 3.8 3.5  CL 111 112* 112*  CO2 12* 11* 11*  BUN 17 17 18   CREATININE 0.82 0.81 0.84   Liver Panel  Recent Labs  10/26/17 2058 10/27/17 0540  PROT 4.0* 3.7*  ALBUMIN <1.0* <1.0*  AST 27 25  ALT 19 17  ALKPHOS 229* 232*  BILITOT 2.0* 1.5*    Microbiology: 10/28 blood cx, 10/25 blood cx  pending Studies/Results: Ct Abdomen Pelvis W Contrast  Result Date: 10/27/2017 CLINICAL DATA:  31 year old male with abdominal pain and an intentional weight loss. EXAM: CT ABDOMEN AND PELVIS WITH CONTRAST TECHNIQUE: Multidetector CT imaging of the abdomen and pelvis was performed using the standard protocol following bolus administration of intravenous contrast. CONTRAST:  100 cc Isovue-300 COMPARISON:  Abdominal radiograph dated 10/02/2017 and CT of the abdomen pelvis dated 09/27/2017 FINDINGS: Lower chest: There are partially visualized moderate right and small left pleural effusions, new from prior study. Bibasilar, right greater left airspace densities are concerning for pneumonia. Clinical correlation is recommended. No intra-abdominal free air. There is diffuse mesenteric edema with progression of anasarca and moderate ascites. Hepatobiliary: The liver is unremarkable. There  is mild periportal edema. The gallbladder is unremarkable. Pancreas: Unremarkable. No pancreatic ductal dilatation or surrounding inflammatory changes. Spleen: Top-normal spleen size measuring up to 14 cm. Adrenals/Urinary Tract: The adrenal glands are unremarkable. The kidneys, visualized ureters, and urinary bladder appear unremarkable. Stomach/Bowel: There is a small hiatal hernia with gastroesophageal reflux. There  is apparent thickening of the wall of the visualized distal esophagus. There is diffuse thickened appearance of the small bowel which may be related to ascites and anasarca or represent an infectious/ inflammatory etiology. There is no bowel obstruction. The appendix is normal. Vascular/Lymphatic: The abdominal aorta and IVC appear unremarkable. No portal venous gas. Multiple mildly enlarged retroperitoneal nodes noted in the upper abdomen measuring up to 13 mm in short axis (series 3, image 37). Reproductive: The prostate and seminal vesicles are grossly unremarkable. Other: Diffuse edema and anasarca, worsened compared to the prior CT. Musculoskeletal: No acute or significant osseous findings. IMPRESSION: 1. Severe anasarca, diffuse mesenteric edema, and moderate ascites. These findings are worsened compared to prior CT. 2. Thickened appearance of the distal esophagus as well as diffusely thickened small bowel. Findings may be related to anasarca: Represent underlying infectious/inflammatory process. Clinical correlation is recommended. 3. Bilateral pleural effusions, new from prior CT. Bibasilar densities, predominantly involving the right lung base are concerning for an infectious process. 4. Mildly enlarged retroperitoneal lymph nodes in the upper abdomen. Electronically Signed   By: Anner Crete M.D.   On: 10/27/2017 23:05   Dg Chest Port 1 View  Result Date: 10/26/2017 CLINICAL DATA:  Sepsis EXAM: PORTABLE CHEST 1 VIEW COMPARISON:  09/29/2017 FINDINGS: Mild right infrahilar atelectasis or infiltrate. Left lung is clear. Normal heart size. No pneumothorax. IMPRESSION: Mild right infrahilar atelectasis or mild infiltrate. Electronically Signed   By: Donavan Foil M.D.   On: 10/26/2017 23:05     Assessment/Plan: AIDS associated wasting = patient is amenable to feeding tube to start TF. Recommend IR to place dobhoff. Recommend to have nutrition follow along as well to watch for refeeding  syndrome  Chronic diarrhea = appears slightly improved? Placed order to o and p as well as microsporidium smear since this has not be checked. Also other random association is toxo which we will check for IgM and IgG.  For now, we will plan on empirically treating with albendazole until studies return. Since he had recent BM aspirate on 10/10 that did not show MAC, we will look for other causes of diarrhea  hiv disease = continue with current regimen of biktavry  Ascites on CT = likely from poor nutritional state and hypoalbunemia. Will continue to follow  Baxter Flattery [facility] Cell: [phone] Pager: [phone]  10/28/2017, 3:25 PM

## 2017-10-28 NOTE — Progress Notes (Addendum)
Patient would not let this RN and two other RN's place the ordered gastric tube, he was asking for medication to numb his throat and nostril before inserting the tube, patient education completed, he verbalized understanding Choi MD notified, Cotrack tube to be placed tomorrow since the cotrack team are not here today, will continue to monitor.

## 2017-10-28 NOTE — Progress Notes (Signed)
Patient heart rate continues to be elevated. Provider on call notified. Order received for bolus IV fluid .

## 2017-10-28 NOTE — Plan of Care (Signed)
Problem: Physical Regulation: Goal: Ability to maintain clinical measurements within normal limits will improve Outcome: Not Progressing HR is still in the 130's to 140's. Provider on call notified. Fluid bolus ordered.

## 2017-10-28 NOTE — Progress Notes (Signed)
Pharmacy notified this RN that the med Albendazole is not available and they would send it to the unit as soon as they have it.

## 2017-10-28 NOTE — Progress Notes (Addendum)
PROGRESS NOTE    Anthony Yang  ZOX:[id number] DOB: Dec 11, 1986 DOA: 2017/11/08 PCP: Patient, No Pcp Per     Brief Narrative:  DEMONDRE AGUAS is a 31 yo male with past medical history of AIDS who presented from HIV clinic due to concern for diarrhea and dehydration. He was recently treated for diarrhea with Flagyl. However, he admits to having about 4-5 loose bowel movements daily. He denies any nausea or vomiting. He was admitted for further evaluation and treatment of chronic diarrhea, dehydration. GI and ID were consulted.   Assessment & Plan:   Principal Problem:   AIDS (acquired immune deficiency syndrome) (HCC) Active Problems:   Non-compliance   Protein-calorie malnutrition, severe   Diarrhea   Dehydration   Chronic diarrhea   Chronic diarrhea -C Diff negative, GI PCR panel negative -GI consulted, they recommend treatment for AIDS, no plan for endoscopy now  -ID checking CMV stool, AFB stool and culture, ova/parasite exam - pending  -Per patient report, diarrhea seems to be improving in frequency (documented 16 episodes 10/26, 6 episodes 10/27, 10 episodes 10/28, 4 episodes 10/29)  Hypotension with volume depletion -Required multiple fluid boluses due to hypotension. He was transferred to stepdown unit 10/29 with CCM consult. He did not require pressors. Lactic acid remained in normal limits. Patient's blood pressure has improved.  -Continue IVF  GI bleed -Hgb 9 --> 6.5 (baseline Hgb 7-8). FOBT positive -S/p 1u pRBC 10/26, 2u pRBC 10/27  -GI consulted, no plan for endoscopy now  -Hgb stable   Hypovolemic hyponatremia -Continue normal saline IV fluid, trend BMP  -Check urine sodium, urine Cr   Nonanion gap metabolic acidosis -Secondary to diarrhea -Monitor BMP   AIDS -Recent CD4 12, viral load 611,000 -ID following  -Biktarvy for ART, bactrim for PCP ppx   Severe malnutrition in context of chronic illness -Dietitian consulted, continue to encourage oral  intake  -Spoke with patient and family today regarding cortrak placement for enteral nutrition. They are in agreement. Cortrak team not available 10/29, but will be available 10/30. Will order NGT to initiate TF, and transition to Cortrak when available   Anasarca -Due to low albumin < 1.0   Hypomagnesemia -Replace, trend    DVT prophylaxis: SCD Code Status: full Family Communication: family at bedside Disposition Plan: pending improvement   Consultants:   ID  GI  Procedures:   None  Antimicrobials:  Anti-infectives    Start     Dose/Rate Route Frequency Ordered Stop   10/28/17 1200  albendazole (ALBENZA) tablet 400 mg     400 mg Oral 2 times daily with meals 10/28/17 0959 11/18/17 0759   10/28/17 1000  azithromycin (ZITHROMAX) tablet 1,200 mg     1,200 mg Oral Weekly 10/28/17 0902     10/24/17 1000  sulfamethoxazole-trimethoprim (BACTRIM DS,SEPTRA DS) 800-160 MG per tablet 1 tablet  Status:  Discontinued     1 tablet Oral Daily 2017-11-08 1634 11-08-17 1635   10/24/17 1000  sulfamethoxazole-trimethoprim (BACTRIM DS,SEPTRA DS) 800-160 MG per tablet 1 tablet     1 tablet Oral Daily 11/08/2017 1635     10/24/17 1000  bictegravir-emtricitabine-tenofovir AF (BIKTARVY) 50-200-25 MG per tablet 1 tablet  Status:  Discontinued     1 tablet Oral Daily 11-08-17 1701 2017-11-08 1915   November 08, 2017 2030  bictegravir-emtricitabine-tenofovir AF (BIKTARVY) 50-200-25 MG per tablet 1 tablet    Comments:  Try to take at the same time each day with or without food.     1  tablet Oral Daily 09/29/2017 1854     10/16/2017 1900  sulfamethoxazole-trimethoprim (BACTRIM,SEPTRA) 400-80 MG per tablet 1 tablet  Status:  Discontinued    Comments:  Patient taking differently: 30 day course filled 10/03/17     1 tablet Oral Daily 09/30/2017 1854 10/17/2017 1915   10/12/2017 1900  abacavir-dolutegravir-lamiVUDine (TRIUMEQ) 600-50-300 MG per tablet 1 tablet  Status:  Discontinued     1 tablet Oral Daily 10/06/2017 1854  10/05/2017 1911       Subjective: No complaints this morning. No worsening SOB, CP. Diarrhea improved.    Objective: Vitals:   10/28/17 0200 10/28/17 0400 10/28/17 0830 10/28/17 1145  BP:  110/72 102/77 108/75  Pulse:  (!) 136 (!) 118 (!) 131  Resp: (!) 24 (!) 23 (!) 21 (!) 27  Temp:  99.5 F (37.5 C) 98.7 F (37.1 C) 97.8 F (36.6 C)  TempSrc:  Oral Oral Oral  SpO2: 100% 100% 100% 100%  Weight:      Height:        Intake/Output Summary (Last 24 hours) at 10/28/17 1209 Last data filed at 10/28/17 0800  Gross per 24 hour  Intake          1247.92 ml  Output              440 ml  Net           807.92 ml   Filed Weights   10/22/2017 1900  Weight: 53.1 kg (117 lb)    Examination:  General exam: Appears calm and comfortable, thin  Respiratory system: Clear to auscultation. Respiratory effort normal. Cardiovascular system: S1 & S2 heard, tachycardic regular rhythm. No JVD, murmurs, rubs, gallops or clicks Gastrointestinal system: Abdomen is nondistended, soft and nontender. No organomegaly or masses felt. Normal bowel sounds heard. +anasarca  Central nervous system: Alert and oriented. No focal neurological deficits. Extremities: Symmetric, +pedal edema Skin: No rashes, lesions or ulcers Psychiatry: Judgement and insight appear normal. Mood & affect appropriate.   Data Reviewed: I have personally reviewed following labs and imaging studies  CBC:  Recent Labs Lab 10/25/17 0637 10/26/17 0148 10/26/17 1813 10/26/17 2058 10/27/17 0332 10/28/17 0405  WBC 11.5* 15.1* 9.4 9.2 8.5 10.6*  NEUTROABS 9.0* 12.6*  --  7.5 6.8 8.4*  HGB 6.4* 11.2* 8.3* 8.7* 7.8* 8.6*  HCT 18.5* 32.0* 23.4* 24.4* 22.0* 24.1*  MCV 74.9* 77.7* 77.0* 77.0* 77.2* 77.5*  PLT 119* 106* 82* 86* 76* 78*   Basic Metabolic Panel:  Recent Labs Lab 10/26/17 0148 10/26/17 1813 10/26/17 2058 10/27/17 0332 10/27/17 0540 10/28/17 0405  NA 125* 128* 128* 127* 128* 127*  K 3.1* 2.7* 2.8* 3.5 3.8 3.5    CL 104 111 111 111 112* 112*  CO2 14* 12* 13* 12* 11* 11*  GLUCOSE 106* 102* 99 107* 100* 102*  BUN 17 17 17 17 17 18   CREATININE 0.95 0.78 0.86 0.82 0.81 0.84  CALCIUM 6.7* 6.2* 6.3* 6.1* 6.3* 6.4*  MG 1.8 1.6*  --  1.7 1.7 1.4*  PHOS  --   --   --  4.0 4.1  --    GFR: Estimated Creatinine Clearance: 95.7 mL/min (by C-G formula based on SCr of 0.84 mg/dL). Liver Function Tests:  Recent Labs Lab 10/17/2017 1138 10/26/17 2058 10/27/17 0540  AST 23 27 25   ALT 19 19 17   ALKPHOS 141* 229* 232*  BILITOT 1.9* 2.0* 1.5*  PROT 5.5* 4.0* 3.7*  ALBUMIN 1.5* <1.0* <1.0*   No results for  input(s): LIPASE, AMYLASE in the last 168 hours. No results for input(s): AMMONIA in the last 168 hours. Coagulation Profile:  Recent Labs Lab 10/26/17 2058  INR 1.80   Cardiac Enzymes:  Recent Labs Lab 10/27/17 0332  TROPONINI 0.03*   BNP (last 3 results) No results for input(s): PROBNP in the last 8760 hours. HbA1C: No results for input(s): HGBA1C in the last 72 hours. CBG: No results for input(s): GLUCAP in the last 168 hours. Lipid Profile: No results for input(s): CHOL, HDL, LDLCALC, TRIG, CHOLHDL, LDLDIRECT in the last 72 hours. Thyroid Function Tests: No results for input(s): TSH, T4TOTAL, FREET4, T3FREE, THYROIDAB in the last 72 hours. Anemia Panel: No results for input(s): VITAMINB12, FOLATE, FERRITIN, TIBC, IRON, RETICCTPCT in the last 72 hours. Sepsis Labs:  Recent Labs Lab 10/15/2017 1322 10/26/17 1813 10/26/17 2058 10/26/17 2351  PROCALCITON  --   --  1.74  --   LATICACIDVEN 1.58 0.7 0.7 1.0    Recent Results (from the past 240 hour(s))  Blood culture (routine x 2)     Status: None   Collection Time: 10/18/17  6:00 PM  Result Value Ref Range Status   Specimen Description BLOOD LEFT ANTECUBITAL  Final   Special Requests   Final    BOTTLES DRAWN AEROBIC AND ANAEROBIC Blood Culture adequate volume   Culture NO GROWTH 5 DAYS  Final   Report Status 10/06/2017 FINAL   Final  Blood culture (routine x 2)     Status: None   Collection Time: 10/18/17  6:31 PM  Result Value Ref Range Status   Specimen Description BLOOD RIGHT HAND  Final   Special Requests IN PEDIATRIC BOTTLE Blood Culture adequate volume  Final   Culture NO GROWTH 5 DAYS  Final   Report Status 10/15/2017 FINAL  Final  Urine culture     Status: Abnormal   Collection Time: 10/18/17  7:20 PM  Result Value Ref Range Status   Specimen Description URINE, RANDOM  Final   Special Requests NONE  Final   Culture <10,000 COLONIES/mL (A)  Final   Report Status 10/20/2017 FINAL  Final  Urine Culture     Status: Abnormal   Collection Time: 10/25/2017  3:34 AM  Result Value Ref Range Status   Specimen Description URINE, CLEAN CATCH  Final   Special Requests Normal  Final   Culture <10,000 COLONIES/mL INSIGNIFICANT GROWTH (A)  Final   Report Status 10/25/2017 FINAL  Final  Culture, blood (routine x 2)     Status: None (Preliminary result)   Collection Time: 10/13/2017  1:00 PM  Result Value Ref Range Status   Specimen Description BLOOD RIGHT ANTECUBITAL  Final   Special Requests   Final    BOTTLES DRAWN AEROBIC AND ANAEROBIC Blood Culture adequate volume   Culture NO GROWTH 4 DAYS  Final   Report Status PENDING  Incomplete  Culture, blood (routine x 2)     Status: None (Preliminary result)   Collection Time: 10/22/2017  1:19 PM  Result Value Ref Range Status   Specimen Description BLOOD LEFT ANTECUBITAL  Final   Special Requests   Final    BOTTLES DRAWN AEROBIC AND ANAEROBIC Blood Culture adequate volume   Culture NO GROWTH 4 DAYS  Final   Report Status PENDING  Incomplete  C difficile quick scan w PCR reflex     Status: None   Collection Time: 10/17/2017  4:48 PM  Result Value Ref Range Status   C Diff antigen NEGATIVE NEGATIVE  Final   C Diff toxin NEGATIVE NEGATIVE Final   C Diff interpretation No C. difficile detected.  Final  Gastrointestinal Panel by PCR , Stool     Status: None   Collection  Time: 2017-11-07  4:48 PM  Result Value Ref Range Status   Campylobacter species NOT DETECTED NOT DETECTED Final   Plesimonas shigelloides NOT DETECTED NOT DETECTED Final   Salmonella species NOT DETECTED NOT DETECTED Final   Yersinia enterocolitica NOT DETECTED NOT DETECTED Final   Vibrio species NOT DETECTED NOT DETECTED Final   Vibrio cholerae NOT DETECTED NOT DETECTED Final   Enteroaggregative E coli (EAEC) NOT DETECTED NOT DETECTED Final   Enteropathogenic E coli (EPEC) NOT DETECTED NOT DETECTED Final   Enterotoxigenic E coli (ETEC) NOT DETECTED NOT DETECTED Final   Shiga like toxin producing E coli (STEC) NOT DETECTED NOT DETECTED Final   Shigella/Enteroinvasive E coli (EIEC) NOT DETECTED NOT DETECTED Final   Cryptosporidium NOT DETECTED NOT DETECTED Final   Cyclospora cayetanensis NOT DETECTED NOT DETECTED Final   Entamoeba histolytica NOT DETECTED NOT DETECTED Final   Giardia lamblia NOT DETECTED NOT DETECTED Final   Adenovirus F40/41 NOT DETECTED NOT DETECTED Final   Astrovirus NOT DETECTED NOT DETECTED Final   Norovirus GI/GII NOT DETECTED NOT DETECTED Final   Rotavirus A NOT DETECTED NOT DETECTED Final   Sapovirus (I, II, IV, and V) NOT DETECTED NOT DETECTED Final  Culture, blood (Routine X 2) w Reflex to ID Panel     Status: None (Preliminary result)   Collection Time: 07-Nov-2017  8:05 PM  Result Value Ref Range Status   Specimen Description BLOOD LEFT HAND  Final   Special Requests IN PEDIATRIC BOTTLE Blood Culture adequate volume  Final   Culture NO GROWTH 4 DAYS  Final   Report Status PENDING  Incomplete  Culture, blood (Routine X 2) w Reflex to ID Panel     Status: None (Preliminary result)   Collection Time: 2017/11/07  8:05 PM  Result Value Ref Range Status   Specimen Description BLOOD LEFT ARM  Final   Special Requests IN PEDIATRIC BOTTLE Blood Culture adequate volume  Final   Culture NO GROWTH 4 DAYS  Final   Report Status PENDING  Incomplete       Radiology  Studies: Ct Abdomen Pelvis W Contrast  Result Date: 10/27/2017 CLINICAL DATA:  31 year old male with abdominal pain and an intentional weight loss. EXAM: CT ABDOMEN AND PELVIS WITH CONTRAST TECHNIQUE: Multidetector CT imaging of the abdomen and pelvis was performed using the standard protocol following bolus administration of intravenous contrast. CONTRAST:  100 cc Isovue-300 COMPARISON:  Abdominal radiograph dated 10/02/2017 and CT of the abdomen pelvis dated 09/27/2017 FINDINGS: Lower chest: There are partially visualized moderate right and small left pleural effusions, new from prior study. Bibasilar, right greater left airspace densities are concerning for pneumonia. Clinical correlation is recommended. No intra-abdominal free air. There is diffuse mesenteric edema with progression of anasarca and moderate ascites. Hepatobiliary: The liver is unremarkable. There is mild periportal edema. The gallbladder is unremarkable. Pancreas: Unremarkable. No pancreatic ductal dilatation or surrounding inflammatory changes. Spleen: Top-normal spleen size measuring up to 14 cm. Adrenals/Urinary Tract: The adrenal glands are unremarkable. The kidneys, visualized ureters, and urinary bladder appear unremarkable. Stomach/Bowel: There is a small hiatal hernia with gastroesophageal reflux. There is apparent thickening of the wall of the visualized distal esophagus. There is diffuse thickened appearance of the small bowel which may be related to ascites and anasarca or  represent an infectious/ inflammatory etiology. There is no bowel obstruction. The appendix is normal. Vascular/Lymphatic: The abdominal aorta and IVC appear unremarkable. No portal venous gas. Multiple mildly enlarged retroperitoneal nodes noted in the upper abdomen measuring up to 13 mm in short axis (series 3, image 37). Reproductive: The prostate and seminal vesicles are grossly unremarkable. Other: Diffuse edema and anasarca, worsened compared to the prior  CT. Musculoskeletal: No acute or significant osseous findings. IMPRESSION: 1. Severe anasarca, diffuse mesenteric edema, and moderate ascites. These findings are worsened compared to prior CT. 2. Thickened appearance of the distal esophagus as well as diffusely thickened small bowel. Findings may be related to anasarca: Represent underlying infectious/inflammatory process. Clinical correlation is recommended. 3. Bilateral pleural effusions, new from prior CT. Bibasilar densities, predominantly involving the right lung base are concerning for an infectious process. 4. Mildly enlarged retroperitoneal lymph nodes in the upper abdomen. Electronically Signed   By: Elgie Collard M.D.   On: 10/27/2017 23:05   Dg Chest Port 1 View  Result Date: 10/26/2017 CLINICAL DATA:  Sepsis EXAM: PORTABLE CHEST 1 VIEW COMPARISON:  10/03/2017 FINDINGS: Mild right infrahilar atelectasis or infiltrate. Left lung is clear. Normal heart size. No pneumothorax. IMPRESSION: Mild right infrahilar atelectasis or mild infiltrate. Electronically Signed   By: Jasmine Pang M.D.   On: 10/26/2017 23:05      Scheduled Meds: . albendazole  400 mg Oral BID WC  . azithromycin  1,200 mg Oral Weekly  . bictegravir-emtricitabine-tenofovir AF  1 tablet Oral Daily  . feeding supplement  1 Container Oral TID BM  . feeding supplement (PRO-STAT SUGAR FREE 64)  30 mL Oral TID  . megestrol  400 mg Oral Daily  . multivitamin  5 mL Oral Daily  . sodium chloride flush  3 mL Intravenous Q12H  . sulfamethoxazole-trimethoprim  1 tablet Oral Daily   Continuous Infusions: . sodium chloride    . sodium chloride 125 mL/hr at 10/28/17 0647     LOS: 5 days    Time spent: 30 minutes   Noralee Stain, DO [facility] [email or web address] Password [email or web address] 10/28/2017, 12:09 PM

## 2017-10-28 NOTE — Progress Notes (Signed)
Pharmacy notified twice for them to dispense Albendazsole tablets, they said med wazs not available and they would dispense it as soon at they have it, will continue to monitor.

## 2017-10-28 NOTE — Progress Notes (Signed)
Patient c/o gas. MD Alvino Chapelhoi notified ,awaiting callback

## 2017-10-28 NOTE — Plan of Care (Signed)
Problem: Activity: Goal: Risk for activity intolerance will decrease Outcome: Not Progressing Dyspnea and Increased HR.

## 2017-10-29 DIAGNOSIS — D62 Acute posthemorrhagic anemia: Secondary | ICD-10-CM

## 2017-10-29 DIAGNOSIS — E860 Dehydration: Secondary | ICD-10-CM

## 2017-10-29 DIAGNOSIS — D696 Thrombocytopenia, unspecified: Secondary | ICD-10-CM | POA: Diagnosis present

## 2017-10-29 LAB — PARVOVIRUS B19 ANTIBODY, IGG AND IGM
Parovirus B19 IgG Abs: 0.2 index (ref 0.0–0.8)
Parovirus B19 IgM Abs: 0.4 index (ref 0.0–0.8)

## 2017-10-29 LAB — BASIC METABOLIC PANEL
Anion gap: 4 — ABNORMAL LOW (ref 5–15)
BUN: 22 mg/dL — AB (ref 6–20)
CHLORIDE: 113 mmol/L — AB (ref 101–111)
CO2: 13 mmol/L — AB (ref 22–32)
CREATININE: 0.93 mg/dL (ref 0.61–1.24)
Calcium: 6.5 mg/dL — ABNORMAL LOW (ref 8.9–10.3)
GFR calc Af Amer: >60 mL/min (ref >60)
GFR calc non Af Amer: >60 mL/min (ref >60)
Glucose, Bld: 101 mg/dL — ABNORMAL HIGH (ref 65–99)
POTASSIUM: 3.9 mmol/L (ref 3.5–5.1)
Sodium: 130 mmol/L — ABNORMAL LOW (ref 135–145)

## 2017-10-29 LAB — CBC WITH DIFFERENTIAL/PLATELET
BASOS PCT: 0 %
Basophils Absolute: 0 10*3/uL (ref 0.0–0.1)
EOS PCT: 0 %
Eosinophils Absolute: 0 10*3/uL (ref 0.0–0.7)
HEMATOCRIT: 22.6 % — AB (ref 39.0–52.0)
HEMOGLOBIN: 7.9 g/dL — AB (ref 13.0–17.0)
LYMPHS ABS: 1.4 10*3/uL (ref 0.7–4.0)
Lymphocytes Relative: 12 %
MCH: 27.3 pg (ref 26.0–34.0)
MCHC: 35 g/dL (ref 30.0–36.0)
MCV: 78.2 fL (ref 78.0–100.0)
MONOS PCT: 9 %
Monocytes Absolute: 1 10*3/uL (ref 0.1–1.0)
NEUTROS ABS: 9 10*3/uL — AB (ref 1.7–7.7)
Neutrophils Relative %: 79 %
Platelets: 89 10*3/uL — ABNORMAL LOW (ref 150–400)
RBC: 2.89 MIL/uL — ABNORMAL LOW (ref 4.22–5.81)
RDW: 19.5 % — AB (ref 11.5–15.5)
WBC: 11.4 10*3/uL — ABNORMAL HIGH (ref 4.0–10.5)

## 2017-10-29 LAB — GLUCOSE, CAPILLARY
GLUCOSE-CAPILLARY: 101 mg/dL — AB (ref 65–99)
GLUCOSE-CAPILLARY: 104 mg/dL — AB (ref 65–99)
GLUCOSE-CAPILLARY: 112 mg/dL — AB (ref 65–99)
Glucose-Capillary: 104 mg/dL — ABNORMAL HIGH (ref 65–99)

## 2017-10-29 LAB — TOXOPLASMA ANTIBODIES- IGG AND  IGM
Toxoplasma Antibody- IgM: <3 [AU]/ml (ref 0.0–7.9)
Toxoplasma IgG Ratio: <3 [IU]/mL (ref 0.0–7.1)

## 2017-10-29 LAB — MAGNESIUM: Magnesium: 1.7 mg/dL (ref 1.7–2.4)

## 2017-10-29 MED ORDER — OSMOLITE 1.5 CAL PO LIQD
1000.0000 mL | ORAL | Status: DC
  Administered 2017-10-29 – 2017-10-30 (×2): 1000 mL
  Filled 2017-10-29 (×4): qty 1000

## 2017-10-29 MED ORDER — JEVITY 1.2 CAL PO LIQD
1000.0000 mL | ORAL | Status: DC
  Administered 2017-10-29: 12:00:00
  Filled 2017-10-29 (×2): qty 1000

## 2017-10-29 MED ORDER — PRO-STAT SUGAR FREE PO LIQD
30.0000 mL | Freq: Two times a day (BID) | ORAL | Status: DC
  Administered 2017-10-29 – 2017-10-31 (×3): 30 mL
  Filled 2017-10-29 (×4): qty 30

## 2017-10-29 MED ORDER — JEVITY 1.2 CAL PO LIQD: 1000.0000 mL | ORAL | Status: DC

## 2017-10-29 MED ORDER — ALBENDAZOLE 200 MG PO TABS
400.0000 mg | ORAL_TABLET | Freq: Two times a day (BID) | ORAL | Status: DC
  Administered 2017-10-29 – 2017-11-06 (×15): 400 mg
  Filled 2017-10-29 (×18): qty 2

## 2017-10-29 MED ORDER — SULFAMETHOXAZOLE-TRIMETHOPRIM 200-40 MG/5ML PO SUSP
20.0000 mL | Freq: Every day | ORAL | Status: DC
  Administered 2017-10-30 – 2017-11-13 (×13): 20 mL via ORAL
  Filled 2017-10-29 (×17): qty 20

## 2017-10-29 NOTE — Progress Notes (Addendum)
Nutrition Consult/Follow Up  DOCUMENTATION CODES:   Severe malnutrition in context of chronic illness  INTERVENTION:    Initiate Osmolite 1.5 @ 20 ml/hr and increase by 10 ml every 12 hours to goal rate of 50 ml/hr  Prostat liquid protein 30 ml BID  Total TF regimen provides 2000 kcal,105 gm of protein, and 914 ml of free water  Recommend monitoring Mg, Phos, K+ x 3 days >> replete as needed  NUTRITION DIAGNOSIS:   Severe Malnutrition related to chronic illness (AIDS) as evidenced by energy intake < 75% for > or equal to 1 month, percent weight loss, ongoing  GOAL:   Patient will meet greater than or equal to 90% of their needs, progressing  MONITOR:   TF tolerance, PO intake, Supplement acceptance, Labs, Weight trends, Skin  ASSESSMENT:   Anthony Yang  is a 31 y.o. male with past medical history relevant for HIV/AIDs (CD4 count was 28) [facility]  being off ART x 2 years with 2 month (originally diagnosed with HIV in 2013 when he was being treated for syphilis) who now presents to the ED from the HIV/infectious disease clinic with concerns about persistent diarrhea and dehydration. Patient has a history of about 50 pound weight loss over the last 6 months or so.  RD consulted for TF initiation & management. Cortrak 10 F feeding tube in place (tip is post pyloric). Pt at high risk for refeeding syndrome given severe malnutrition. Medications reviewed and include MVI, Reglan and Megace. Labs reviewed. Na 130 (L). CBG 104.  Diet Order:  Diet regular Room service appropriate? Yes; Fluid consistency: Thin  EDUCATION NEEDS:   No education needs have been identified at this time  Skin:  Skin Assessment: Skin Integrity Issues: Skin Integrity Issues:: Incisions Incisions: sacrum Other: open wound on coccyx and sacrum  Last BM:  10/31  Height:   Ht Readings from Last 1 Encounters:  10/01/2017 5\' 6"  (1.676 m)    Weight:    Wt Readings from Last 1 Encounters:  10/04/2017 117 lb (53.1 kg)    Ideal Body Weight:  64.5 kg  BMI:  Body mass index is 18.88 kg/m.  Estimated Nutritional Needs:   Kcal:  1900-2100  Protein:  105-120 gm  Fluid:  1.9-2.1 L  Maureen ChattersKatie Gurjit Loconte, RD, LDN Pager #: [phone] After-Hours Pager #: [phone]

## 2017-10-29 NOTE — Progress Notes (Signed)
Daily Rounding Note  10/29/2017, 10:33 AM  LOS: 6 days    Patient seen by GI/Dr. 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  Dr. Leone Payor suspected patient was chronically infected with something leading to the diarrhea and did not feel there was a role for colonoscopy.  He felt that his primary issue was with poorly controlled AIDS and with adequate treatment the diarrhea would improve. Stool path panel + for Giardia during admission 08/2017 and treated with Metronidazole but diarrhea continued.  Stool C. Difficile studies and GI pathogen panel all negative. Patient's CMV Ab IgG is significantly elevated at >10.  ID has requested GI reinvolvement and consideration for colonoscopy with biopsy to assess for CMV involvement in the colon.  The diarrhea has improved since his admission.  He was having around 10 watery to loose stools daily but is now down to 2-3 soft, formed stools daily.  He still has a sensation of abdominal bloating.     OBJECTIVE:         Vital signs in last 24 hours:    Temp:  [97.8 F (36.6 C)-100.1 F (37.8 C)] 98.2 F (36.8 C) (10/31 0730) Pulse Rate:  [109-131] 113 (10/31 0730) Resp:  [20-27] 20 (10/31 0730) BP: (102-125)/(62-75) 107/62 (10/31 0730) SpO2:  [99 %-100 %] 99 % (10/31 0730) Last BM Date: 10/29/17 Filed Weights   11-12-17 1900  Weight: 53.1 kg (117 lb)   General: Cachectic, severely malnourished looking.  Nasogastric feeding tube in place on the right nares. Hiccuping and tearful at times Heart: RRR. Chest: Clear bilaterally.  No labored breathing. Abdomen: Slightly bloated but not distended.  Not tender.  Active bowel sounds.  No masses.  No HSM. Extremities: Nonpitting pedal edema. Neuro/Psych: Alert.  Oriented x3.  Calm but seems depressed with flat affect.  Answers all questions appropriately.  Intake/Output from previous day: 10/30 0701 - 10/31  0700 In: 5103.3 [P.O.:420; I.V.:4683.3] Out: 550 [Urine:280; Emesis/NG output:270]  Intake/Output this shift: No intake/output data recorded.  Lab Results:  Recent Labs  10/27/17 0332 10/28/17 0405 10/29/17 0712  WBC 8.5 10.6* 11.4*  HGB 7.8* 8.6* 7.9*  HCT 22.0* 24.1* 22.6*  PLT 76* 78* 89*   BMET  Recent Labs  10/27/17 0540 10/28/17 0405 10/29/17 0712  NA 128* 127* 130*  K 3.8 3.5 3.9  CL 112* 112* 113*  CO2 11* 11* 13*  GLUCOSE 100* 102* 101*  BUN 17 18 22*  CREATININE 0.81 0.84 0.93  CALCIUM 6.3* 6.4* 6.5*   LFT  Recent Labs  10/26/17 2058 10/27/17 0540  PROT 4.0* 3.7*  ALBUMIN <1.0* <1.0*  AST 27 25  ALT 19 17  ALKPHOS 229* 232*  BILITOT 2.0* 1.5*   PT/INR  Recent Labs  10/26/17 2058  LABPROT 20.8*  INR 1.80   Hepatitis Panel No results for input(s): HEPBSAG, HCVAB, HEPAIGM, HEPBIGM in the last 72 hours.  Studies/Results: Ct Abdomen Pelvis W Contrast  Result Date: 10/27/2017 CLINICAL DATA:  31 year old male with abdominal pain and an intentional weight loss. EXAM: CT ABDOMEN AND PELVIS WITH CONTRAST TECHNIQUE: Multidetector CT imaging of the abdomen and  pelvis was performed using the standard protocol following bolus administration of intravenous contrast. CONTRAST:  100 cc Isovue-300 COMPARISON:  Abdominal radiograph dated 10/02/2017 and CT of the abdomen pelvis dated 09/27/2017 FINDINGS: Lower chest: There are partially visualized moderate right and small left pleural effusions, new from prior study. Bibasilar, right greater left airspace densities are concerning for pneumonia. Clinical correlation is recommended. No intra-abdominal free air. There is diffuse mesenteric edema with progression of anasarca and moderate ascites. Hepatobiliary: The liver is unremarkable. There is mild periportal edema. The gallbladder is unremarkable. Pancreas: Unremarkable. No pancreatic ductal dilatation or surrounding inflammatory changes. Spleen: Top-normal spleen  size measuring up to 14 cm. Adrenals/Urinary Tract: The adrenal glands are unremarkable. The kidneys, visualized ureters, and urinary bladder appear unremarkable. Stomach/Bowel: There is a small hiatal hernia with gastroesophageal reflux. There is apparent thickening of the wall of the visualized distal esophagus. There is diffuse thickened appearance of the small bowel which may be related to ascites and anasarca or represent an infectious/ inflammatory etiology. There is no bowel obstruction. The appendix is normal. Vascular/Lymphatic: The abdominal aorta and IVC appear unremarkable. No portal venous gas. Multiple mildly enlarged retroperitoneal nodes noted in the upper abdomen measuring up to 13 mm in short axis (series 3, image 37). Reproductive: The prostate and seminal vesicles are grossly unremarkable. Other: Diffuse edema and anasarca, worsened compared to the prior CT. Musculoskeletal: No acute or significant osseous findings. IMPRESSION: 1. Severe anasarca, diffuse mesenteric edema, and moderate ascites. These findings are worsened compared to prior CT. 2. Thickened appearance of the distal esophagus as well as diffusely thickened small bowel. Findings may be related to anasarca: Represent underlying infectious/inflammatory process. Clinical correlation is recommended. 3. Bilateral pleural effusions, new from prior CT. Bibasilar densities, predominantly involving the right lung base are concerning for an infectious process. 4. Mildly enlarged retroperitoneal lymph nodes in the upper abdomen. Electronically Signed   By: Elgie CollardArash  Radparvar M.D.   On: 10/27/2017 23:05   Scheduled Meds: . albendazole  400 mg Oral BID WC  . azithromycin  1,200 mg Oral Weekly  . bictegravir-emtricitabine-tenofovir AF  1 tablet Oral Daily  . feeding supplement  1 Container Oral TID BM  . feeding supplement (PRO-STAT SUGAR FREE 64)  30 mL Per Tube BID  . megestrol  400 mg Oral Daily  . multivitamin  5 mL Oral Daily  .  sodium chloride flush  3 mL Intravenous Q12H  . sulfamethoxazole-trimethoprim  1 tablet Oral Daily   Continuous Infusions: . sodium chloride    . sodium chloride 125 mL/hr at 10/29/17 0254  . feeding supplement (OSMOLITE 1.5 CAL)     PRN Meds:.sodium chloride, acetaminophen **OR** acetaminophen, albuterol, baclofen, calcium carbonate, iopamidol, iopamidol, metoCLOPramide (REGLAN) injection, ondansetron **OR** ondansetron (ZOFRAN) IV, phenol, simethicone, sodium chloride flush, traZODone  ASSESMENT:   *  Chronic diarrhea.  C. difficile and gastrointestinal panel studies negative.  FOBT positive, though no overt bleeding per rectum or melena.  Serologies positive for CMV.  Question if CMV involves the colon.  As he is severely malnourished and protein deficient, colonic edema and malabsorption is also a possible source of the diarrhea. Diarrhea has improved since admission.  Down from 10, loose/watery stools daily to 2 or 3 soft formed stools daily.  *  AIDS with high viral count.  CD4 count 12.  Wasting, FTT.  *  Chronic anemia. Borderline low to microcytic.  Iron, iron sats, TIBC low.  Ferritin 4800  *   Severe protein calorie  malnutrition with wasting.  Fine gauge nasogastric feeding tube placed today and will start tube feeds soon.  Megace in place  *  Hiccups.  Current management strategy is as needed metoclopramide, Thorazine and baclofen.  Rule out esophagitis, reflux versus infectious, as a cause.  *  Electrolyte derangements including sodium, chloride.  Previous hypokalemia and hypomagnesemia corrected.   PLAN   *  Dr Myrtie Neither will decide re colonoscopy.  Pt prefers to avoid colonoscopy but is not entirely unwilling to undergo procedure if recommended.  If he does undergo colonoscopy, wonder if he could also undergo upper endoscopy for investigation of the hiccups.?    Jennye Moccasin  10/29/2017, 10:33 AM Pager: [phone]   I have discussed the case with the PA, and that is the  plan I formulated. I personally interviewed and examined the patient.  I have performed an extensive chart review, extensively interviewed and examined the patient and reviewed the images of his CT scan from 2 days ago. Most notably, he has ascites and diffuse bowel edema, which is clearly related to his severe hypoalbuminemia. He has mild distention of the transverse colon. He has not had bloody diarrhea, and it sounds as if his diarrhea is improved overall from last week. This does not seem to be the clinical picture of CMV colitis. I suspect he has malabsorption from diffuse intestinal edema. I am glad that he has been started on small bowel feedings, and hopefully with time, patience and protein -calorie nutrition he will improve. He has been treated for a positive Giardia test on 11-07-2017, and a repeat ova and parasite is pending. His C. difficile is negative. The significance of his positive fecal occult blood test is unclear, but does not appear to require endoscopic workup at present. I'm afraid I do not think I can be of any more help for this persistent hiccups. An upper endoscopy is very unlikely to be revealing for that, and would also almost certainly dislodge his small bowel feeding tube.  I discuss this all with the patient and his questions of been answered. Truthfully, he really did not wish to have a colonoscopy at all. I reviewed this with Dr. Drue Second infectious disease, and we will gladly reconsult as the need arises.  Total 40 minutes face-to-face, over half spent in chart review, counseling and coordinate and care. Charlie Pitter III Pager [phone]  Mon-Fri 8a-5p [phone] after 5p, weekends, holidays

## 2017-10-29 NOTE — Progress Notes (Signed)
Cortrak Tube Team Note:  Consult received to place a Cortrak feeding tube.  Noted platelets <100, spoke with Dr Irene LimboGoodrich, ok to proceed with nasal bridle placement.   A 10 F Cortrak tube was placed in the R nare and secured with a nasal bridle at 71 cm. Per the Cortrak monitor reading the tube tip is post pyloric.   No x-ray is required. RN may begin using tube.  If the tube becomes dislodged please keep the tube and contact the Cortrak team at [email or web address] (password [email or web address]) for replacement.  If after hours and replacement cannot be delayed, place a NG tube and confirm placement with an abdominal x-ray.    Kendell BaneHeather Abigale Dorow RD, LDN, CNSC [phone] Pager [phone] After Hours Pager

## 2017-10-29 NOTE — Progress Notes (Signed)
[facility] for Infectious Disease  Date of Admission:  10/29/2017     Total days of antibiotics 0          Principal Problem:   Chronic diarrhea Active Problems:   AIDS (acquired immune deficiency syndrome) (HCC)   Non-compliance   Protein-calorie malnutrition, severe   Diarrhea   Dehydration   . albendazole  400 mg Oral BID WC  . azithromycin  1,200 mg Oral Weekly  . bictegravir-emtricitabine-tenofovir AF  1 tablet Oral Daily  . feeding supplement  1 Container Oral TID BM  . feeding supplement (PRO-STAT SUGAR FREE 64)  30 mL Per Tube BID  . megestrol  400 mg Oral Daily  . multivitamin  5 mL Oral Daily  . sodium chloride flush  3 mL Intravenous Q12H  . sulfamethoxazole-trimethoprim  1 tablet Oral Daily    SUBJECTIVE: Diarrhea came back overnight - 3 BMs last PM and one today. Cortrack feeding tube in place. Patient now with hiccups which he a/w IVF. Eating a little bit.   Review of Systems: Review of Systems  Constitutional: Negative for chills and fever.  HENT: Negative for tinnitus.   Eyes: Negative for blurred vision and photophobia.  Respiratory: Negative for cough and sputum production.   Cardiovascular: Negative for chest pain and palpitations.  Gastrointestinal: Positive for abdominal pain. Negative for diarrhea, nausea and vomiting.  Genitourinary: Negative for dysuria.  Skin: Negative for rash.  Neurological: Negative for headaches.    Allergies  Allergen Reactions  . Penicillins Hives    Has patient had a PCN reaction causing immediate rash, facial/tongue/throat swelling, SOB or lightheadedness with hypotension: Yes Has patient had a PCN reaction causing severe rash involving mucus membranes or skin necrosis: No Has patient had a PCN reaction that required hospitalization: No Has patient had a PCN reaction occurring within the last 10 years: No If all of the above answers are "NO", then may proceed with Cephalosporin use.     OBJECTIVE: Vitals:   10/28/17 2118 10/29/17 0001 10/29/17 0400 10/29/17 0730  BP: 106/70 102/66 125/71 107/62  Pulse:   (!) 109 (!) 113  Resp:   (!) 23 20  Temp: 98.7 F (37.1 C)  100.1 F (37.8 C) 98.2 F (36.8 C)  TempSrc: Oral  Oral Oral  SpO2:   100% 99%  Weight:      Height:       Body mass index is 18.88 kg/m.  Physical Exam  Constitutional: He is oriented to person, place, and time. He appears cachectic. He has a sickly appearance.  Eyes: No scleral icterus.  Cardiovascular: Regular rhythm and normal heart sounds.  Tachycardia present.   No murmur heard. Pulmonary/Chest: Effort normal and breath sounds normal.  Abdominal: Soft. Bowel sounds are normal. He exhibits no distension. There is tenderness.  Lymphadenopathy:    He has no cervical adenopathy.  Neurological: He is alert and oriented to person, place, and time.  Skin: Skin is warm and dry.  Psychiatric: Affect normal.    Lab Results Lab Results  Component Value Date   WBC 11.4 (H) 10/29/2017   HGB 7.9 (L) 10/29/2017   HCT 22.6 (L) 10/29/2017   MCV 78.2 10/29/2017   PLT 89 (L) 10/29/2017    Lab Results  Component Value Date   CREATININE 0.93 10/29/2017   BUN 22 (H) 10/29/2017   NA 130 (L) 10/29/2017   K 3.9 10/29/2017   CL 113 (H) 10/29/2017  CO2 13 (L) 10/29/2017    Lab Results  Component Value Date   ALT 17 10/27/2017   AST 25 10/27/2017   ALKPHOS 232 (H) 10/27/2017   BILITOT 1.5 (H) 10/27/2017     Microbiology: BCx 10/25 >> NG x 4d  BCx 10/28 >> pending Bone Marrow Cx 10/1 >> negative AFB CDiff Ag 10/25 >> neg GI PCR Panel 10/25 >> negative CMV PCR >> ~35,000   ASSESSMENT: 31 yo AA male with advanced HIV/AIDS with chronic diarrhea and severe malnutrition. Microsporidia vs CMV colitis. MAC excluded with negative BM Bx. Nausea likely r/t azithromycin. + CMV PCR however uncertain significance at this point.   PLAN:  Will ask GI about re-consideration for colonoscopy for biopsy  to determine if CMV is causing his diarrhea/colitis. If unable to tolerate scope may add empiric valganciclovir in addition to empiric albendazole until we can rule in or out microsporidia.   Enteral tube in place - dietary recommendations have been made for tube feeding. Will need to watch for re-feeding syndrome. Encouraged the patient to continue PO intake.   Hiccups? Uncertain about cause - defer to GI.   Janene Madeira, MSN, NP-C [facility] for Infectious [place] Pager: [phone]  10/29/2017  10:22 AM

## 2017-10-29 NOTE — Progress Notes (Signed)
PROGRESS NOTE  Anthony FriendsMarcus L Yang ZOX:[id number] DOB: [birth year] DOA: 10/04/2017 PCP: Patient, No Pcp Per  CM scheduled post hospital follow up for 11[facility]14[facility]2017 [facility]  Brief Narrative: 31 year old male PMH AIDS presented to the emergency department from ID clinic with ongoing diarrhea, found to have tachycardia and dehydration.  Seen by gastroenterology for chronic diarrhea, failure to thrive, weight loss.  No colonoscopy recommended.  Recommended treating underlying illness.  Followed by infectious disease with further studies sent.  He developed hypotension and tachycardia refractory to IV fluids and was transferred to stepdown unit and seen in consultation with critical care.  Assessment/Plan Chronic diarrhea, dehydration. Recent giardia.  GI pathogen panel negative.  C. difficile negative. -Diarrhea has decreased. -Per GI no role for endoscopy.  Treat AIDs. -Per ID, continue albendazole, f/u studies  -await CMV viral load  AIDS with associated wasting, FTT, weight loss. CD4 count 12, VL 611k. Off ART 2 years. Started on Triumeq 08/2017. -continue biktarvy; daily Batcrim and weekly azithromycin for OI prophylaxis.   FTT with reported 50lb weight loss. -TF, monitor for refeeding syndrome.  Check magnesium, phosphorus, BMP in a.m.  Pseudo-hypocalcemia. -Corrected calcium is 9.0 given severe hypoalbuminemia.  Non-AG metabolic acidosis -secondary to diarrhea.  Microcytic anemia -Trending down.  Repeat CBC in a.m. -Parvovirus pending   Chronic thrombocytopenia -Better today.  Presumed secondary to AIDS  Hypovolemic hyponatremia  -Improving.  Severe malnutrition in the context of chronic illness, albumin <1.0 -continue Megace   Feeding tube placed today.  Management.  Dietitian.  Monitor for refeeding syndrome.  Anti-infectives as per ID.  Clinical condition remains guarded.  DVT prophylaxis: SCDs Code Status: full Family Communication:  sister-in-law at bedside Disposition Plan: home    Brendia Sacksaniel Goodrich, MD  [facility] Direct contact: [phone] --Via amion app OR  --[email or web address]; password [email or web address]  7PM-7AM contact night coverage as above 10/29/2017, 10:50 AM  LOS: 6 days   Consultants:  ID  GI  PCCM  Procedures:  Transfusion 1 unit PRBC 10/26  Transfusion 2 unit PRBC 10/27  Antimicrobials:  Albendazole 10/30 >>  Interval history/Subjective: Diarrhea has decreased.  Some nausea.  No vomiting.  Hiccups.  Some gas pain in the abdomen.  Breathing okay.  Objective: Vitals: Afebrile, 98.2, 27, 110, 107/62, 99% on room air  Exam:     Constitutional: Appears chronically ill, nontoxic  Eyes.  Appear grossly unremarkable.  ENT.  Lips appear grossly normal.  Respiratory.  Clear to auscultation bilaterally.  No wheezes, rales or rhonchi.  Normal respiratory effort.  Cardiovascular.  Regular rate and rhythm.  No murmur, rub or gallop.  Trace bilateral pedal edema.  Abdomen.  Soft, nondistended.  Mild tenderness.  Psychiatric very grossly normal mood and affect.  Speech fluent and appropriate   I have personally reviewed the following:   Labs:  Sodium improved, 130.  CO2 improved, 13.  BUN slightly higher, 22.  Creatinine within normal limits.  Magnesium 1.7.  Calcium 6.5  WBC without significant change, 11.4.  Platelets improved, 89.  Hemoglobin slightly lower, 7.9.  Imaging studies:  CT abdomen and pelvis noted.  Clinically there is no evidence of other findings secondary to hypoalbuminemia.  Medical tests:     Test discussed with performing physician:    Decision to obtain old records:    Review and summation of old records:    Scheduled Meds: . albendazole  400 mg Oral BID WC  . azithromycin  1,200 mg Oral Weekly  .  bictegravir-emtricitabine-tenofovir AF  1 tablet Oral Daily  . feeding supplement  1 Container Oral TID BM  . feeding supplement (PRO-STAT SUGAR FREE  64)  30 mL Per Tube BID  . megestrol  400 mg Oral Daily  . multivitamin  5 mL Oral Daily  . sodium chloride flush  3 mL Intravenous Q12H  . sulfamethoxazole-trimethoprim  1 tablet Oral Daily   Continuous Infusions: . sodium chloride    . sodium chloride 125 mL/hr at 10/29/17 0254  . feeding supplement (OSMOLITE 1.5 CAL)      Principal Problem:   Chronic diarrhea Active Problems:   AIDS (acquired immune deficiency syndrome) (HCC)   Non-compliance   Microcytic anemia   Protein-calorie malnutrition, severe   Dehydration   Thrombocytopenia (HCC)   LOS: 6 days

## 2017-10-30 ENCOUNTER — Inpatient Hospital Stay (HOSPITAL_COMMUNITY): Payer: Medicaid Other

## 2017-10-30 ENCOUNTER — Ambulatory Visit: Payer: Self-pay

## 2017-10-30 DIAGNOSIS — E46 Unspecified protein-calorie malnutrition: Secondary | ICD-10-CM

## 2017-10-30 LAB — BASIC METABOLIC PANEL
Anion gap: 4 — ABNORMAL LOW (ref 5–15)
BUN: 26 mg/dL — AB (ref 6–20)
CHLORIDE: 114 mmol/L — AB (ref 101–111)
CO2: 13 mmol/L — AB (ref 22–32)
CREATININE: 0.85 mg/dL (ref 0.61–1.24)
Calcium: 6.5 mg/dL — ABNORMAL LOW (ref 8.9–10.3)
GFR calc non Af Amer: >60 mL/min (ref >60)
Glucose, Bld: 136 mg/dL — ABNORMAL HIGH (ref 65–99)
Potassium: 3.5 mmol/L (ref 3.5–5.1)
Sodium: 131 mmol/L — ABNORMAL LOW (ref 135–145)

## 2017-10-30 LAB — GLUCOSE, CAPILLARY
GLUCOSE-CAPILLARY: 101 mg/dL — AB (ref 65–99)
Glucose-Capillary: 117 mg/dL — ABNORMAL HIGH (ref 65–99)
Glucose-Capillary: 108 mg/dL — ABNORMAL HIGH (ref 65–99)
Glucose-Capillary: 101 mg/dL — ABNORMAL HIGH (ref 65–99)

## 2017-10-30 LAB — PHOSPHORUS: Phosphorus: 3.2 mg/dL (ref 2.5–4.6)

## 2017-10-30 LAB — CBC
HEMATOCRIT: 22.3 % — AB (ref 39.0–52.0)
Hemoglobin: 7.7 g/dL — ABNORMAL LOW (ref 13.0–17.0)
MCH: 27.1 pg (ref 26.0–34.0)
MCHC: 34.5 g/dL (ref 30.0–36.0)
MCV: 78.5 fL (ref 78.0–100.0)
Platelets: 79 10*3/uL — ABNORMAL LOW (ref 150–400)
RBC: 2.84 MIL/uL — ABNORMAL LOW (ref 4.22–5.81)
RDW: 19.5 % — AB (ref 11.5–15.5)
WBC: 10.7 10*3/uL — AB (ref 4.0–10.5)

## 2017-10-30 LAB — OVA + PARASITE EXAM

## 2017-10-30 LAB — MAGNESIUM: Magnesium: 1.6 mg/dL — ABNORMAL LOW (ref 1.7–2.4)

## 2017-10-30 LAB — O&P RESULT

## 2017-10-30 MED ORDER — ONDANSETRON HCL 4 MG/2ML IJ SOLN
4.0000 mg | Freq: Four times a day (QID) | INTRAMUSCULAR | Status: DC
  Administered 2017-10-30 – 2017-11-01 (×8): 4 mg via INTRAVENOUS
  Filled 2017-10-30 (×8): qty 2

## 2017-10-30 MED ORDER — SODIUM CHLORIDE 0.9 % IV BOLUS (SEPSIS)
500.0000 mL | Freq: Once | INTRAVENOUS | Status: AC
  Administered 2017-10-30: 500 mL via INTRAVENOUS

## 2017-10-30 MED ORDER — PROMETHAZINE HCL 25 MG/ML IJ SOLN
12.5000 mg | Freq: Four times a day (QID) | INTRAMUSCULAR | Status: DC | PRN
  Administered 2017-10-30 – 2017-11-01 (×4): 12.5 mg via INTRAVENOUS
  Filled 2017-10-30 (×4): qty 1

## 2017-10-30 MED ORDER — PROMETHAZINE HCL 25 MG/ML IJ SOLN
12.5000 mg | Freq: Once | INTRAMUSCULAR | Status: AC
  Administered 2017-10-30: 12.5 mg via INTRAVENOUS
  Filled 2017-10-30: qty 1

## 2017-10-30 MED ORDER — MAGNESIUM SULFATE 2 GM/50ML IV SOLN
2.0000 g | Freq: Once | INTRAVENOUS | Status: AC
  Administered 2017-10-30: 2 g via INTRAVENOUS
  Filled 2017-10-30: qty 50

## 2017-10-30 NOTE — Progress Notes (Signed)
PROGRESS NOTE  NHAN QUALLEY ZOX:[id number] DOB: Dec 16, 1986 DOA: 09/30/2017 PCP: Patient, No Pcp Per  CM scheduled post hospital follow up for 11/14/2017 at 1:00 pm @ [facility].  Brief Narrative: 31 year old male PMH AIDS presented to the emergency department from ID clinic with ongoing diarrhea, found to have tachycardia and dehydration.  Seen by gastroenterology for chronic diarrhea, failure to thrive, weight loss.  No colonoscopy recommended.  Recommended treating underlying illness.  Followed by infectious disease with further studies sent.  He developed hypotension and tachycardia refractory to IV fluids and was transferred to stepdown unit and seen in consultation with critical care.  Assessment/Plan Chronic diarrhea, dehydration. Recent giardia.  GI pathogen panel negative.  C. difficile negative. -diarrhea subjectively better. 7 stools recorded last 24 hours. -GI recommends against colonoscopy and EGD. Recommends TF and treat AIDS.  -Per ID, continue albendazole, f/u studies   AIDS with associated wasting, FTT, weight loss. CD4 count 12, VL 611k. Off ART 2 years. Started on Triumeq 08/2017. -continue biktarvy; daily Batcrim and weekly azithromycin for OI prophylaxis.   FTT with reported 50lb weight loss. -continue TF, monitor for refeeding syndrome.  Check magnesium, phosphorus, BMP again in AM  Vomiting. -reportedly an issue prior to FT. Abd exam benign. CT abd/pelvis 10/29 without acute features. Portable AXR 11/1 no acute features. -Schedule Zofran for persistent nausea, intermittent vomiting. PRN Phenergan for breakthrough vomiting. Continue TF  Pseudo-hypocalcemia. -Corrected calcium recently 9.0 given severe hypoalbuminemia.  Non-AG metabolic acidosis -secondary to diarrhea.  Microcytic anemia -stable. Check CBC 11/3. -Parvovirus pending   Chronic thrombocytopenia -stable  Hypovolemic hyponatremia  -slowly improving  Severe malnutrition in the  context of chronic illness, albumin <1.0 -continue Megace   Condition remains guarded. Further recommendations per ID.  DVT prophylaxis: SCDs Code Status: full Family Communication: sister-in-law at bedside Disposition Plan: home    Brendia Sacks, MD  [facility] Direct contact: [phone] --Via amion app OR  --[email or web address]; password [email or web address]  7PM-7AM contact night coverage as above 10/30/2017, 12:07 PM  LOS: 7 days   Consultants:  ID  GI  PCCM  Procedures:  Transfusion 1 unit PRBC 10/26  Transfusion 2 unit PRBC 10/27  Antimicrobials:  Albendazole 10/30 >>  Interval history/Subjective: ID and GI notes from yesterday noted. Multiple episodes of vomiting overnight. Per RN appearance of bile, not TF. TF held. RR and HR increased. KUB was fine.  No pain, but having intermittent vomiting, reported as green. Ate 2 bites of food today.  Objective: Vitals:  Vitals:   10/30/17 0437 10/30/17 0744  BP: 106/73 107/74  Pulse: 84 (!) 111  Resp: (!) 21 (!) 25  Temp: 99.2 F (37.3 C) 97.9 F (36.6 C)  SpO2: 100% 100%    Exam:  Constitutional:  . Appears calm, acutely and chronically ill Eyes:  . pupils and irises appear normal ENMT:  . grossly normal hearing  Respiratory:  . CTA bilaterally, no w/r/r.  . Respiratory effort normal. Cardiovascular:  . RRR, no m/r/g . 2+ BLE extremity edema   Abdomen:  . Soft, nt Musculoskeletal:  . Digits/nails BUE: no clubbing, cyanosis, petechiae, infection Psychiatric:  . judgement and insight appear normal . Mental status o Mood, affect appropriate o Orientation to person, place, time    I have personally reviewed the following:  7 stools recorded Labs:  CBG stable  Na 131  Mg 1.6  Hgb stable 7.7  Plts stable 79  BG no growth thus far  Imaging studies:  AXR  portable: enteric tube RUQ of abdomen likely duodenum.    Scheduled Meds: . albendazole  400 mg Per Tube BID WC  . azithromycin   1,200 mg Oral Weekly  . bictegravir-emtricitabine-tenofovir AF  1 tablet Oral Daily  . feeding supplement  1 Container Oral TID BM  . feeding supplement (PRO-STAT SUGAR FREE 64)  30 mL Per Tube BID  . megestrol  400 mg Oral Daily  . multivitamin  5 mL Oral Daily  . sodium chloride flush  3 mL Intravenous Q12H  . sulfamethoxazole-trimethoprim  20 mL Oral Daily   Continuous Infusions: . sodium chloride    . feeding supplement (OSMOLITE 1.5 CAL) Stopped (10/30/17 0422)    Principal Problem:   Chronic diarrhea Active Problems:   AIDS (acquired immune deficiency syndrome) (HCC)   Non-compliance   Microcytic anemia   Protein-calorie malnutrition, severe   Dehydration   Thrombocytopenia (HCC)   LOS: 7 days

## 2017-10-30 NOTE — Progress Notes (Addendum)
Clance Boll. Bodenheimer, NP with hospitalist notified d/t concern with patient having multiple episodes of vomiting and new onset vocal hoarseness with concern for cortrak displacement.  Tube feedings held pending hospitalist notification.  Pt RR 30's with HR increasing to 130-140's. Orders received to continue to hold tube feeding and obtain KUB.  Per hospitalist ok to hold tube feedings until rounding team evaluates. Phenergan given per order for vomiting.

## 2017-10-30 NOTE — Progress Notes (Signed)
[facility]   Reason for visit: Follow up on AIDS, malnutrition, chronic diarrhea  Interval History: unable to swallow pills this am, afebrile; vomited overnight and tube feeds stopped; no complaints to me today.  Father at bedside   Physical Exam: Constitutional:  Vitals:   10/30/17 0437 10/30/17 0744  BP: 106/73 107/74  Pulse: 84 (!) 111  Resp: (!) 21 (!) 25  Temp: 99.2 F (37.3 C) 97.9 F (36.6 C)  SpO2: 100% 100%   patient appears in NAD; chronically ill-appearing, cachectic Eyes: anicteric HENT: + feeding tube Respiratory: Normal respiratory effort; CTA B Cardiovascular: RRR GI: soft, nt, nd  Review of Systems: Constitutional: negative for fevers and chills Integument/breast: negative for rash Musculoskeletal: negative for myalgias and arthralgias  Lab Results  Component Value Date   WBC 10.7 (H) 10/30/2017   HGB 7.7 (L) 10/30/2017   HCT 22.3 (L) 10/30/2017   MCV 78.5 10/30/2017   PLT 79 (L) 10/30/2017    Lab Results  Component Value Date   CREATININE 0.85 10/30/2017   BUN 26 (H) 10/30/2017   NA 131 (L) 10/30/2017   K 3.5 10/30/2017   CL 114 (H) 10/30/2017   CO2 13 (L) 10/30/2017    Lab Results  Component Value Date   ALT 17 10/27/2017   AST 25 10/27/2017   ALKPHOS 232 (H) 10/27/2017     Microbiology: Recent Results (from the past 240 hour(s))  Urine Culture     Status: Abnormal   Collection Time: 26-Jul-2017  3:34 AM  Result Value Ref Range Status   Specimen Description URINE, CLEAN CATCH  Final   Special Requests Normal  Final   Culture <10,000 COLONIES/mL INSIGNIFICANT GROWTH (A)  Final   Report Status 10/25/2017 FINAL  Final  Culture, blood (routine x 2)     Status: None   Collection Time: 26-Jul-2017  1:00 PM  Result Value Ref Range Status   Specimen Description BLOOD RIGHT ANTECUBITAL  Final   Special Requests   Final    BOTTLES DRAWN AEROBIC AND ANAEROBIC Blood Culture adequate volume   Culture NO GROWTH 5 DAYS   Final   Report Status 10/28/2017 FINAL  Final  Culture, blood (routine x 2)     Status: None   Collection Time: 26-Jul-2017  1:19 PM  Result Value Ref Range Status   Specimen Description BLOOD LEFT ANTECUBITAL  Final   Special Requests   Final    BOTTLES DRAWN AEROBIC AND ANAEROBIC Blood Culture adequate volume   Culture NO GROWTH 5 DAYS  Final   Report Status 10/28/2017 FINAL  Final  C difficile quick scan w PCR reflex     Status: None   Collection Time: 26-Jul-2017  4:48 PM  Result Value Ref Range Status   C Diff antigen NEGATIVE NEGATIVE Final   C Diff toxin NEGATIVE NEGATIVE Final   C Diff interpretation No C. difficile detected.  Final  Gastrointestinal Panel by PCR , Stool     Status: None   Collection Time: 26-Jul-2017  4:48 PM  Result Value Ref Range Status   Campylobacter species NOT DETECTED NOT DETECTED Final   Plesimonas shigelloides NOT DETECTED NOT DETECTED Final   Salmonella species NOT DETECTED NOT DETECTED Final   Yersinia enterocolitica NOT DETECTED NOT DETECTED Final   Vibrio species NOT DETECTED NOT DETECTED Final   Vibrio cholerae NOT DETECTED NOT DETECTED Final   Enteroaggregative E coli (EAEC) NOT DETECTED NOT DETECTED Final   Enteropathogenic E  coli (EPEC) NOT DETECTED NOT DETECTED Final   Enterotoxigenic E coli (ETEC) NOT DETECTED NOT DETECTED Final   Shiga like toxin producing E coli (STEC) NOT DETECTED NOT DETECTED Final   Shigella/Enteroinvasive E coli (EIEC) NOT DETECTED NOT DETECTED Final   Cryptosporidium NOT DETECTED NOT DETECTED Final   Cyclospora cayetanensis NOT DETECTED NOT DETECTED Final   Entamoeba histolytica NOT DETECTED NOT DETECTED Final   Giardia lamblia NOT DETECTED NOT DETECTED Final   Adenovirus F40/41 NOT DETECTED NOT DETECTED Final   Astrovirus NOT DETECTED NOT DETECTED Final   Norovirus GI/GII NOT DETECTED NOT DETECTED Final   Rotavirus A NOT DETECTED NOT DETECTED Final   Sapovirus (I, II, IV, and V) NOT DETECTED NOT DETECTED Final    Culture, blood (Routine X 2) w Reflex to ID Panel     Status: None   Collection Time: 10/21/2017  8:05 PM  Result Value Ref Range Status   Specimen Description BLOOD LEFT HAND  Final   Special Requests IN PEDIATRIC BOTTLE Blood Culture adequate volume  Final   Culture NO GROWTH 5 DAYS  Final   Report Status 10/28/2017 FINAL  Final  Culture, blood (Routine X 2) w Reflex to ID Panel     Status: None   Collection Time: 10/22/2017  8:05 PM  Result Value Ref Range Status   Specimen Description BLOOD LEFT ARM  Final   Special Requests IN PEDIATRIC BOTTLE Blood Culture adequate volume  Final   Culture NO GROWTH 5 DAYS  Final   Report Status 10/28/2017 FINAL  Final  Culture, blood (x 2)     Status: None (Preliminary result)   Collection Time: 10/26/17  9:08 PM  Result Value Ref Range Status   Specimen Description BLOOD LEFT ARM  Final   Special Requests   Final    BOTTLES DRAWN AEROBIC AND ANAEROBIC Blood Culture adequate volume   Culture NO GROWTH 3 DAYS  Final   Report Status PENDING  Incomplete  Culture, blood (x 2)     Status: None (Preliminary result)   Collection Time: 10/26/17  9:10 PM  Result Value Ref Range Status   Specimen Description BLOOD RIGHT LOWER FOREARM  Final   Special Requests   Final    BOTTLES DRAWN AEROBIC AND ANAEROBIC Blood Culture adequate volume   Culture NO GROWTH 3 DAYS  Final   Report Status PENDING  Incomplete  OVA + PARASITE EXAM     Status: None   Collection Time: 10/28/17  5:25 PM  Result Value Ref Range Status   OVA + PARASITE EXAM Final report  Final    Comment: (NOTE) These results were obtained using wet preparation(s) and trichrome stained smear. This test does not include testing for Cryptosporidium parvum, Cyclospora, or Microsporidia. Performed At: [facility] [address] [place], [place] [id number] Mila Homer MD WU:[id number]    Source of Sample STOOL  Final    Impression/Plan:  1. AIDS - on Biktarvy and tolerating other  than trouble swallowing this am.  It appears he was able to take it.  This will be key for his recovery.   2.  FTT - from #1, chronic diarrhea.  Advancing feeds as much as possible.  Monitor for refeeding syndrome.   3.  OI risk - on bactrim, azithromycin.

## 2017-10-30 NOTE — Progress Notes (Signed)
Pt. Vomiting despite Zofran and phenergan given as ordered.  Having frequent, watery diarrhea.  MD notified.  Will continue to monitor.

## 2017-10-30 DEATH — deceased

## 2017-10-31 ENCOUNTER — Encounter: Payer: Self-pay | Admitting: Licensed Clinical Social Worker

## 2017-10-31 LAB — GLUCOSE, CAPILLARY
GLUCOSE-CAPILLARY: 82 mg/dL (ref 65–99)
GLUCOSE-CAPILLARY: 90 mg/dL (ref 65–99)
GLUCOSE-CAPILLARY: 91 mg/dL (ref 65–99)
Glucose-Capillary: 83 mg/dL (ref 65–99)
Glucose-Capillary: 87 mg/dL (ref 65–99)
Glucose-Capillary: 90 mg/dL (ref 65–99)

## 2017-10-31 LAB — BASIC METABOLIC PANEL
Anion gap: 6 (ref 5–15)
BUN: 31 mg/dL — AB (ref 6–20)
CHLORIDE: 114 mmol/L — AB (ref 101–111)
CO2: 13 mmol/L — AB (ref 22–32)
CREATININE: 0.87 mg/dL (ref 0.61–1.24)
Calcium: 6.5 mg/dL — ABNORMAL LOW (ref 8.9–10.3)
GFR calc Af Amer: >60 mL/min (ref >60)
GFR calc non Af Amer: >60 mL/min (ref >60)
GLUCOSE: 105 mg/dL — AB (ref 65–99)
POTASSIUM: 3.5 mmol/L (ref 3.5–5.1)
Sodium: 133 mmol/L — ABNORMAL LOW (ref 135–145)

## 2017-10-31 LAB — PHOSPHORUS: Phosphorus: 3.7 mg/dL (ref 2.5–4.6)

## 2017-10-31 LAB — MAGNESIUM: Magnesium: 1.9 mg/dL (ref 1.7–2.4)

## 2017-10-31 MED ORDER — VITAL 1.5 CAL PO LIQD
1000.0000 mL | ORAL | Status: DC
  Administered 2017-10-31 – 2017-11-05 (×5): 1000 mL
  Filled 2017-10-31 (×13): qty 1000

## 2017-10-31 NOTE — Plan of Care (Signed)
Problem: Fluid Volume: Goal: Ability to maintain a balanced intake and output will improve Outcome: Not Progressing Patient ability to increase intake hindered by nausea and frequent vomiting. Tube feeds stopped throughout the night.   Problem: Nutrition: Goal: Adequate nutrition will be maintained Outcome: Not Progressing Patient intake hindered by nausea and vomiting.

## 2017-10-31 NOTE — Progress Notes (Signed)
[facility]   Reason for visit: Follow up on AIDS, malnutrition, chronic diarrhea  Interval History:no complaints, tearful; afebrile; vomited this am but tube feeds on; no complaints to me today.     Physical Exam: Constitutional:  Vitals:   10/31/17 0040 10/31/17 0402  BP: 117/83 107/76  Pulse: 94 (!) 125  Resp: (!) 22 (!) 22  Temp: 98.9 F (37.2 C) 99.9 F (37.7 C)  SpO2: 100% 99%   patient appears in NAD; chronically ill-appearing, cachectic Eyes: anicteric HENT: + feeding tube running Respiratory: Normal respiratory effort; CTA B Cardiovascular: RRR GI: soft, nt, nd  Review of Systems: Constitutional: negative for fevers and chills Integument/breast: negative for rash Musculoskeletal: negative for myalgias and arthralgias  Lab Results  Component Value Date   WBC 10.7 (H) 10/30/2017   HGB 7.7 (L) 10/30/2017   HCT 22.3 (L) 10/30/2017   MCV 78.5 10/30/2017   PLT 79 (L) 10/30/2017    Lab Results  Component Value Date   CREATININE 0.87 10/31/2017   BUN 31 (H) 10/31/2017   NA 133 (L) 10/31/2017   K 3.5 10/31/2017   CL 114 (H) 10/31/2017   CO2 13 (L) 10/31/2017    Lab Results  Component Value Date   ALT 17 10/27/2017   AST 25 10/27/2017   ALKPHOS 232 (H) 10/27/2017     Microbiology: Recent Results (from the past 240 hour(s))  Urine Culture     Status: Abnormal   Collection Time: 10/22/2017  3:34 AM  Result Value Ref Range Status   Specimen Description URINE, CLEAN CATCH  Final   Special Requests Normal  Final   Culture <10,000 COLONIES/mL INSIGNIFICANT GROWTH (A)  Final   Report Status 10/25/2017 FINAL  Final  Culture, blood (routine x 2)     Status: None   Collection Time: 10/26/2017  1:00 PM  Result Value Ref Range Status   Specimen Description BLOOD RIGHT ANTECUBITAL  Final   Special Requests   Final    BOTTLES DRAWN AEROBIC AND ANAEROBIC Blood Culture adequate volume   Culture NO GROWTH 5 DAYS  Final   Report Status  10/28/2017 FINAL  Final  Culture, blood (routine x 2)     Status: None   Collection Time: 10/11/2017  1:19 PM  Result Value Ref Range Status   Specimen Description BLOOD LEFT ANTECUBITAL  Final   Special Requests   Final    BOTTLES DRAWN AEROBIC AND ANAEROBIC Blood Culture adequate volume   Culture NO GROWTH 5 DAYS  Final   Report Status 10/28/2017 FINAL  Final  C difficile quick scan w PCR reflex     Status: None   Collection Time: 10/12/2017  4:48 PM  Result Value Ref Range Status   C Diff antigen NEGATIVE NEGATIVE Final   C Diff toxin NEGATIVE NEGATIVE Final   C Diff interpretation No C. difficile detected.  Final  Gastrointestinal Panel by PCR , Stool     Status: None   Collection Time: 10/17/2017  4:48 PM  Result Value Ref Range Status   Campylobacter species NOT DETECTED NOT DETECTED Final   Plesimonas shigelloides NOT DETECTED NOT DETECTED Final   Salmonella species NOT DETECTED NOT DETECTED Final   Yersinia enterocolitica NOT DETECTED NOT DETECTED Final   Vibrio species NOT DETECTED NOT DETECTED Final   Vibrio cholerae NOT DETECTED NOT DETECTED Final   Enteroaggregative E coli (EAEC) NOT DETECTED NOT DETECTED Final   Enteropathogenic E coli (EPEC) NOT DETECTED  NOT DETECTED Final   Enterotoxigenic E coli (ETEC) NOT DETECTED NOT DETECTED Final   Shiga like toxin producing E coli (STEC) NOT DETECTED NOT DETECTED Final   Shigella/Enteroinvasive E coli (EIEC) NOT DETECTED NOT DETECTED Final   Cryptosporidium NOT DETECTED NOT DETECTED Final   Cyclospora cayetanensis NOT DETECTED NOT DETECTED Final   Entamoeba histolytica NOT DETECTED NOT DETECTED Final   Giardia lamblia NOT DETECTED NOT DETECTED Final   Adenovirus F40/41 NOT DETECTED NOT DETECTED Final   Astrovirus NOT DETECTED NOT DETECTED Final   Norovirus GI/GII NOT DETECTED NOT DETECTED Final   Rotavirus A NOT DETECTED NOT DETECTED Final   Sapovirus (I, II, IV, and V) NOT DETECTED NOT DETECTED Final  Culture, blood (Routine X 2)  w Reflex to ID Panel     Status: None   Collection Time: 10/15/2017  8:05 PM  Result Value Ref Range Status   Specimen Description BLOOD LEFT HAND  Final   Special Requests IN PEDIATRIC BOTTLE Blood Culture adequate volume  Final   Culture NO GROWTH 5 DAYS  Final   Report Status 10/28/2017 FINAL  Final  Culture, blood (Routine X 2) w Reflex to ID Panel     Status: None   Collection Time: 09/29/2017  8:05 PM  Result Value Ref Range Status   Specimen Description BLOOD LEFT ARM  Final   Special Requests IN PEDIATRIC BOTTLE Blood Culture adequate volume  Final   Culture NO GROWTH 5 DAYS  Final   Report Status 10/28/2017 FINAL  Final  Culture, blood (x 2)     Status: None (Preliminary result)   Collection Time: 10/26/17  9:08 PM  Result Value Ref Range Status   Specimen Description BLOOD LEFT ARM  Final   Special Requests   Final    BOTTLES DRAWN AEROBIC AND ANAEROBIC Blood Culture adequate volume   Culture NO GROWTH 4 DAYS  Final   Report Status PENDING  Incomplete  Culture, blood (x 2)     Status: None (Preliminary result)   Collection Time: 10/26/17  9:10 PM  Result Value Ref Range Status   Specimen Description BLOOD RIGHT LOWER FOREARM  Final   Special Requests   Final    BOTTLES DRAWN AEROBIC AND ANAEROBIC Blood Culture adequate volume   Culture NO GROWTH 4 DAYS  Final   Report Status PENDING  Incomplete  OVA + PARASITE EXAM     Status: None   Collection Time: 10/28/17  5:25 PM  Result Value Ref Range Status   OVA + PARASITE EXAM Final report  Final    Comment: (NOTE) These results were obtained using wet preparation(s) and trichrome stained smear. This test does not include testing for Cryptosporidium parvum, Cyclospora, or Microsporidia. Performed At: [facility] [address] [place], [place] [id number] Mila Homer MD WG:[id number]    Source of Sample STOOL  Final    Impression/Plan:  1. AIDS - on Biktarvy and tolerating and swallowing now.  Biggest issue  is hiccup.   2.  FTT - from #1, chronic diarrhea.  Advancing feeds as much as possible.  Monitor for refeeding syndrome.   3.  OI risk - continue on bactrim, azithromycin.    Dr. Drue Second available over the weekend if needed.

## 2017-10-31 NOTE — Progress Notes (Signed)
Pt still has nausea and vomiting after getting 12.5 mg Phenergan.  Scheduled zofran has been given and 5 mg Reglan IV to try to control GI symptoms.  Will continue to monitor.  Harriet Massonavidson, Timmie Dugue E, RN

## 2017-10-31 NOTE — Progress Notes (Signed)
PROGRESS NOTE  Anthony Yang [id number] DOB: 05/31/1986 DOA: 10/26/2017 PCP: Patient, No Pcp Per  CM scheduled post hospital follow up for 11/14/2017 at 1:00 pm [facility].  Brief Narrative: 31 year old male PMH AIDS presented to the emergency department from ID clinic with ongoing diarrhea, found to have tachycardia and dehydration.  Seen by gastroenterology for chronic diarrhea, failure to thrive, weight loss.  No colonoscopy recommended.  Recommended treating underlying illness.  Followed by infectious disease with further studies sent.  He developed hypotension and tachycardia refractory to IV fluids and was transferred to stepdown unit and seen in consultation with critical care.  Assessment/Plan Chronic diarrhea, dehydration. Recent giardia. GI pathogen panel negative. C. difficile negative. - multiple stools recorded though patient reports subjective improvement. - no endoscopy rec per GI - management per ID  Vomiting. CT abd/pelvis 10/29 without acute features. Portable AXR 11/1 no acute features. GI rec against upper/lower endoscopy. - patient minimizes symptoms. Recent imaging unremarkable. Likely connected to diarrhea/AIDS - scheduled Zofran + PRN Phenergan  FTT with reported 50lb weight loss. -resume TF, monitor for refeeding syndrome.  Check Mg, phos, BMP again in AM  AIDS with associated wasting, FTT, weight loss. CD4 count 12, VL 611k. Off ART 2 years. Started on Triumeq 08/2017. -continue biktarvy; daily Batcrim and weekly azithromycin for OI prophylaxis.   Pseudo-hypocalcemia. -Corrected calcium recently 9.0 given severe hypoalbuminemia.  Non-AG metabolic acidosis -secondary to diarrhea.  Microcytic anemia -stable. Check CBC 11/3. -Parvovirus pending   Chronic thrombocytopenia -stable  Hypovolemic hyponatremia  -improving with IVF  Severe malnutrition in the context of chronic illness, albumin <1.0 -continue Megace   No significant  improvement. Continue treatment for diarrhea and vomiting. Resume TF (discussed with dietitian). D/c telemetry. Transfer to Med-surg.   DVT prophylaxis: SCDs Code Status: full Family Communication: sister-in-law at bedside Disposition Plan: home    Brendia Sacksaniel Niomie Englert, MD  [facility] Direct contact: [phone] --Via amion app OR  --[email or web address]; password [email or web address]  7PM-7AM contact night coverage as above 10/31/2017, 7:54 AM  LOS: 8 days   Consultants:  ID  GI  PCCM  Procedures:  Transfusion 1 unit PRBC 10/26  Transfusion 2 unit PRBC 10/27  Antimicrobials:  Albendazole 10/30 >>  Interval history/Subjective: RN last night documented frequent vomiting and diarrhea beginning last evening. Patient reports no complaints to multiple physicians.  Patient reports some vomiting, reports diarrhea improving, no abdominal pain.  Objective: Vitals:  Vitals:   10/31/17 0040 10/31/17 0402  BP: 117/83 107/76  Pulse: 94 (!) 125  Resp: (!) 22 (!) 22  Temp: 98.9 F (37.2 C) 99.9 F (37.7 C)  SpO2: 100% 99%    Exam: Constitutional:  . Appears chronically ill, uncomfortable but not toxic Eyes:  . pupils and irises appear normal ENMT:  . grossly normal hearing  . Lips appear normal Respiratory:  . CTA bilaterally, no w/r/r.  . Respiratory effort normal.  Cardiovascular:  . RRR, no m/r/g . Trace bilateral pedal edema   Abdomen:  . no tenderness  Musculoskeletal:  . Digits/nails BUE: no clubbing, cyanosis, petechiae, infection Psychiatric:  . judgement and insight appear normal . Mental status o Mood, affect appropriate  I have personally reviewed the following:   7 stools recorded  Labs:  CBG stable  No sig change in BMP except BUN up to 31. Mg and phos WNL.  Imaging studies:     Scheduled Meds: . albendazole  400 mg Per Tube BID WC  . azithromycin  1,200  mg Oral Weekly  . bictegravir-emtricitabine-tenofovir AF  1 tablet Oral Daily  . feeding  supplement  1 Container Oral TID BM  . feeding supplement (PRO-STAT SUGAR FREE 64)  30 mL Per Tube BID  . megestrol  400 mg Oral Daily  . multivitamin  5 mL Oral Daily  . ondansetron (ZOFRAN) IV  4 mg Intravenous Q6H  . sodium chloride flush  3 mL Intravenous Q12H  . sulfamethoxazole-trimethoprim  20 mL Oral Daily   Continuous Infusions: . sodium chloride    . feeding supplement (OSMOLITE 1.5 CAL) 1,000 mL (10/30/17 1429)    Principal Problem:   Chronic diarrhea Active Problems:   AIDS (acquired immune deficiency syndrome) (HCC)   Non-compliance   Microcytic anemia   Protein-calorie malnutrition, severe   Dehydration   Thrombocytopenia (HCC)   LOS: 8 days

## 2017-10-31 NOTE — Progress Notes (Signed)
Nutrition Follow Up  DOCUMENTATION CODES:   Severe malnutrition in context of chronic illness  INTERVENTION:    D/C Osmolite 1.5  Start Vital 1.5 at 10 ml/hr and increase by 10 ml every 12 hours to goal rate of 60 ml/hr  Provides 2160 kcals, 97 gm protein, 1100 ml of free water daily  NUTRITION DIAGNOSIS:   Severe Malnutrition related to chronic illness (AIDS) as evidenced by energy intake < 75% for > or equal to 1 month, percent weight loss, ongoing  GOAL:   Patient will meet greater than or equal to 90% of their needs, progressing  MONITOR:   TF tolerance, PO intake, Supplement acceptance, Labs, Weight trends, Skin  ASSESSMENT:   Anthony Yang  is a 31 y.o. male with past medical history relevant for HIV/AIDs (CD4 count was 28) [facility] after  being off ART x 2 years with 2 month (originally diagnosed with HIV in 2013 when he was being treated for syphilis) who now presents to the ED from the HIV/infectious disease clinic with concerns about persistent diarrhea and dehydration. Patient has a history of about 50 pound weight loss over the last 6 months or so.  10/31 Cortrak tube placed   Chart reviewed. Pt having nausea and frequent vomiting. On Regular diet. PO intake has been very poor. Taking bites. GI note reviewed. No colonoscopy recommended.  Continues to be at risk for refeeding syndrome. Medications reviewed and include MVI, Reglan and Megace. Labs reviewed. Na 133 (L). Mg, Phos, K WNL.  Nutrition care plan discussed with Dr. Irene LimboGoodrich. Will start Vital 1.5 formula (semi-elemental). Tube tip likely in proximal duodenum.  Diet Order:  Diet regular Room service appropriate? Yes; Fluid consistency: Thin  EDUCATION NEEDS:   No education needs have been identified at this time  Skin:  Skin Assessment: Skin Integrity Issues: Skin Integrity Issues:: Incisions Incisions: sacrum Other: open wound on coccyx and  sacrum  Last BM:  11/2  Height:   Ht Readings from Last 1 Encounters:  10/09/2017 5\' 6"  (1.676 m)   Weight:   Wt Readings from Last 1 Encounters:  10/31/17 143 lb (64.9 kg)   Ideal Body Weight:  64.5 kg  BMI:  Body mass index is 23.08 kg/m.  Estimated Nutritional Needs:   Kcal:  1900-2100  Protein:  105-120 gm  Fluid:  1.9-2.1 L  Maureen ChattersKatie Makenzi Bannister, RD, LDN Pager #: [phone] After-Hours Pager #: [phone]

## 2017-11-01 LAB — CULTURE, BLOOD (ROUTINE X 2)
CULTURE: NO GROWTH
Culture: NO GROWTH
SPECIAL REQUESTS: ADEQUATE
Special Requests: ADEQUATE

## 2017-11-01 LAB — HEMOGLOBIN AND HEMATOCRIT, BLOOD
HCT: 24.3 % — ABNORMAL LOW (ref 39.0–52.0)
HEMOGLOBIN: 8.6 g/dL — AB (ref 13.0–17.0)

## 2017-11-01 LAB — HEPATIC FUNCTION PANEL
ALT: 18 U/L (ref 17–63)
AST: 26 U/L (ref 15–41)
Albumin: <1 g/dL — ABNORMAL LOW (ref 3.5–5.0)
Alkaline Phosphatase: 273 U/L — ABNORMAL HIGH (ref 38–126)
BILIRUBIN DIRECT: 0.2 mg/dL (ref 0.1–0.5)
BILIRUBIN INDIRECT: 0.6 mg/dL (ref 0.3–0.9)
Total Bilirubin: 0.8 mg/dL (ref 0.3–1.2)
Total Protein: 4 g/dL — ABNORMAL LOW (ref 6.5–8.1)

## 2017-11-01 LAB — GLUCOSE, CAPILLARY
GLUCOSE-CAPILLARY: 121 mg/dL — AB (ref 65–99)
GLUCOSE-CAPILLARY: 92 mg/dL (ref 65–99)
GLUCOSE-CAPILLARY: 94 mg/dL (ref 65–99)
Glucose-Capillary: 92 mg/dL (ref 65–99)
Glucose-Capillary: 109 mg/dL — ABNORMAL HIGH (ref 65–99)
Glucose-Capillary: 99 mg/dL (ref 65–99)

## 2017-11-01 LAB — BASIC METABOLIC PANEL
ANION GAP: 6 (ref 5–15)
BUN: 30 mg/dL — AB (ref 6–20)
CALCIUM: 6.2 mg/dL — AB (ref 8.9–10.3)
CO2: 14 mmol/L — ABNORMAL LOW (ref 22–32)
Chloride: 114 mmol/L — ABNORMAL HIGH (ref 101–111)
Creatinine, Ser: 0.94 mg/dL (ref 0.61–1.24)
GFR calc Af Amer: >60 mL/min (ref >60)
GLUCOSE: 100 mg/dL — AB (ref 65–99)
Potassium: 3.4 mmol/L — ABNORMAL LOW (ref 3.5–5.1)
Sodium: 134 mmol/L — ABNORMAL LOW (ref 135–145)

## 2017-11-01 LAB — CBC
HCT: 17 % — ABNORMAL LOW (ref 39.0–52.0)
Hemoglobin: 5.8 g/dL — CL (ref 13.0–17.0)
MCH: 27 pg (ref 26.0–34.0)
MCHC: 34.1 g/dL (ref 30.0–36.0)
MCV: 79.1 fL (ref 78.0–100.0)
PLATELETS: 67 10*3/uL — AB (ref 150–400)
RBC: 2.15 MIL/uL — AB (ref 4.22–5.81)
RDW: 20 % — ABNORMAL HIGH (ref 11.5–15.5)
WBC: 7.5 10*3/uL (ref 4.0–10.5)

## 2017-11-01 LAB — PREPARE RBC (CROSSMATCH)

## 2017-11-01 LAB — MAGNESIUM: MAGNESIUM: 1.7 mg/dL (ref 1.7–2.4)

## 2017-11-01 LAB — PHOSPHORUS: PHOSPHORUS: 3.7 mg/dL (ref 2.5–4.6)

## 2017-11-01 MED ORDER — PROCHLORPERAZINE EDISYLATE 5 MG/ML IJ SOLN
10.0000 mg | Freq: Four times a day (QID) | INTRAMUSCULAR | Status: DC | PRN
  Filled 2017-11-01 (×2): qty 2

## 2017-11-01 MED ORDER — DRONABINOL 2.5 MG PO CAPS
2.5000 mg | ORAL_CAPSULE | Freq: Two times a day (BID) | ORAL | Status: DC
  Administered 2017-11-01 – 2017-11-05 (×8): 2.5 mg via ORAL
  Filled 2017-11-01 (×9): qty 1

## 2017-11-01 MED ORDER — SODIUM CHLORIDE 0.9 % IV SOLN: Freq: Once | INTRAVENOUS | Status: AC

## 2017-11-01 MED ORDER — SODIUM CHLORIDE 0.9% FLUSH
10.0000 mL | INTRAVENOUS | Status: DC | PRN
  Administered 2017-11-01 – 2017-11-04 (×3): 10 mL
  Administered 2017-11-04: 40 mL
  Administered 2017-11-06: 10 mL
  Filled 2017-11-01 (×5): qty 40

## 2017-11-01 MED ORDER — SODIUM CHLORIDE 0.9% FLUSH
10.0000 mL | Freq: Two times a day (BID) | INTRAVENOUS | Status: DC
  Administered 2017-11-08 – 2017-11-10 (×5): 10 mL

## 2017-11-01 MED ORDER — PROCHLORPERAZINE EDISYLATE 5 MG/ML IJ SOLN
10.0000 mg | Freq: Four times a day (QID) | INTRAMUSCULAR | Status: DC | PRN
  Filled 2017-11-01: qty 2

## 2017-11-01 MED ORDER — CALCIUM GLUCONATE 10 % IV SOLN
1.0000 g | Freq: Once | INTRAVENOUS | Status: AC
  Administered 2017-11-01: 1 g via INTRAVENOUS
  Filled 2017-11-01: qty 10

## 2017-11-01 MED ORDER — SODIUM CHLORIDE 0.9 % IV SOLN
Freq: Once | INTRAVENOUS | Status: AC
  Administered 2017-11-01: 14:00:00 via INTRAVENOUS

## 2017-11-01 NOTE — Progress Notes (Signed)
Upon assessment no veins found for PIV. Vein for midline visualized.

## 2017-11-01 NOTE — Progress Notes (Signed)
CRITICAL VALUE ALERT  Critical Value:calcium 6.2  Date & Time Notied:  11/01/2017, 04:51 Provider Notified: yes. 04:52  Orders Received/Actions taken: calcium gluconate IVP ordered

## 2017-11-01 NOTE — Progress Notes (Signed)
PROGRESS NOTE  Anthony Yang ZOX:[id number] DOB: 03/06/1986 DOA: 11/04/2017 PCP: Patient, No Pcp Per  CM scheduled post hospital follow up for 11/14/2017 at 1:00 pm @ [facility].  Brief Narrative: 31 year old male PMH AIDS presented to the emergency department from ID clinic with ongoing diarrhea, found to have tachycardia and dehydration.  Seen by gastroenterology for chronic diarrhea, failure to thrive, weight loss.  No colonoscopy recommended.  Recommended treating underlying illness.  Followed by infectious disease with further studies sent.  He developed hypotension and tachycardia refractory to IV fluids and was transferred to stepdown unit and seen in consultation with critical care.  Assessment/Plan Chronic diarrhea, dehydration. Recent giardia. GI pathogen panel negative. C. difficile negative. - slowly improving, no abdominal pain. No endoscopy per GI. Continue albendazole per ID.   Vomiting. CT abd/pelvis 10/29 without acute features. Portable AXR 11/1 no acute features. GI rec against upper/lower endoscopy. - abd exam benign, presume secondary to GI process/AIDS - unrelieved by scheduled ondansetron and PRN promethazine (seems to worsen symptoms).   - scheduled Zofran + PRN Compazine, Reglan.  - start Marinol  Microcytic anemia, anemia of critical illness/AIDS -worse today. Transfuse 2 units PRBC. No evidence of bleeding. -Parvovirus abs negative  FTT with reported 50lb weight loss. -tolerating TF  -Mg, phos, K+ adequate. Check again in AM--monitoring for refeeding syndrome.  AIDS with associated wasting, FTT, weight loss. CD4 count 12, VL 611k. Off ART 2 years. Started on Triumeq 08/2017. -continue biktarvy; daily Batcrim and weekly azithromycin for OI prophylaxis.   Pseudo-hypocalcemia. -Corrected calcium 8.7 given severe hypoalbuminemia. -Tums  Non-AG metabolic acidosis -stable, secondary to diarrhea  Chronic thrombocytopenia -slightly lower today.  Recheck in AM  Hypovolemic hyponatremia  -improving with TF  Severe malnutrition in the context of chronic illness, albumin <1.0 -continue Megace   Remains ill but not toxic. Still vomiting and has diarrhea and unable to tolerate PO. Remain inpatient  Consult ST for oral dysphagia  DVT prophylaxis: SCDs Code Status: full Family Communication: father at bedside Disposition Plan: home    Brendia Sacks, MD  [facility] Direct contact: [phone] --Via amion app OR  --[email or web address]; password [email or web address]  7PM-7AM contact night coverage as above 11/01/2017, 9:18 AM  LOS: 9 days   Consultants:  ID  GI  PCCM  Procedures:  Transfusion 1 unit PRBC 10/26  Transfusion 2 unit PRBC 10/27  Antimicrobials:  Albendazole 10/30 >>  Interval history/Subjective: Low grade temp. PRBC ordered. Midline ordered.  Nausea and frequent vomiting overnight. Diarrhea slowing down. Poor appetite, unable to keep food down. No abdominal pain.  Father reports pt has difficulty swallowing.  Objective: Vitals:  Vitals:   11/01/17 0731 11/01/17 0912  BP: 116/70 (!) 114/55  Pulse: (!) 131 (!) 128  Resp: (!) 23 (!) 23  Temp: 100.1 F (37.8 C) 98.8 F (37.1 C)  SpO2: 99% 99%    Exam:  Constitutional:  . Appears ill, debilitated, weak Eyes:  . pupils and irises appear normal ENMT:  . grossly normal hearing  . Lips appear normal Respiratory:  . CTA bilaterally, no w/r/r.  . Respiratory effort normal. Cardiovascular:  . Tachycardic, regular rhythm, no m/r/g . 1+ BLE extremity edema   Abdomen:  . Soft, ntnd Skin:  . No rashes, lesions, ulcers Psychiatric:  . judgement and insight appear normal . Mental status o Mood, affect appropriate  I have personally reviewed the following:   6 stools recorded  Labs:  CBG stable  Hgb 7.7 >> 5.8  Plts 79 >> 67  BMP noted, stable creatinine. LFTs noted.  Imaging studies:     Scheduled Meds: . albendazole  400 mg  Per Tube BID WC  . azithromycin  1,200 mg Oral Weekly  . bictegravir-emtricitabine-tenofovir AF  1 tablet Oral Daily  . megestrol  400 mg Oral Daily  . multivitamin  5 mL Oral Daily  . ondansetron (ZOFRAN) IV  4 mg Intravenous Q6H  . sodium chloride flush  10-40 mL Intracatheter Q12H  . sodium chloride flush  3 mL Intravenous Q12H  . sulfamethoxazole-trimethoprim  20 mL Oral Daily   Continuous Infusions: . sodium chloride    . sodium chloride    . feeding supplement (VITAL 1.5 CAL) 1,000 mL (10/31/17 1249)    Principal Problem:   Chronic diarrhea Active Problems:   AIDS (acquired immune deficiency syndrome) (HCC)   Non-compliance   Microcytic anemia   Protein-calorie malnutrition, severe   Dehydration   Thrombocytopenia (HCC)   LOS: 9 days

## 2017-11-01 NOTE — Progress Notes (Signed)
CRITICAL VALUE ALERT  Critical Value:  Hb 5.8  Date & Time Notied:  11/ 02/2017; 04:14  Provider Notified:yes @04 :17 Orders Received/Actions taken: Transfuse 1 unit of RBC.

## 2017-11-01 NOTE — Evaluation (Signed)
Clinical/Bedside Swallow Evaluation Patient Details  Name: Anthony Yang MRN: [id number] Date of Birth: 1986-04-02  Today's Date: 11/01/2017 Time: SLP Start Time (ACUTE ONLY): 1701 SLP Stop Time (ACUTE ONLY): 1720 SLP Time Calculation (min) (ACUTE ONLY): 19 min  Past Medical History:  Past Medical History:  Diagnosis Date  . AIDS (acquired immune deficiency syndrome) (HCC)    hx/notes 09/25/2017  . Anemia   . History of blood transfusion 09/25/2017   "anemic"  . HIV (human immunodeficiency virus infection) (HCC)    hx/notes 09/24/2017   Past Surgical History:  Past Surgical History:  Procedure Laterality Date  . FRACTURE SURGERY    . ORIF FINGER / THUMB FRACTURE Right   . WRIST FRACTURE SURGERY Left    HPI:  31 year old male PMH AIDS presented to the emergency department from ID clinic with ongoing diarrhea, found to have tachycardia and dehydration. Seen by gastroenterology for chronic diarrhea, failure to thrive, weight loss. No colonoscopy recommended. CT abdomen 10/29 revealed bilateral pleural effusions, bibasilar densities, predominantly involving the right lung base are concerning for an infectious process; thickened appearance of the distal esophagus as well as diffusely thickened small bowel.   Assessment / Plan / Recommendation Clinical Impression  Patient's oropharyngeal swallow appears grossly within functional limits with adequate airway protection at bedside. Oral mechanism examination is unremarkable; pt swallows single and multiple sips of thin liquids with appearance of timely swallow and no overt signs of aspiration. Pt does exhibit some oral aversion behaviors with solids. He grimaces when swallowing pureed solids, though this appears due to taste. With regular solid cracker, pt states, "this pattern is happening again, where I chew and chew and chew and can't swallow." SLP cued pt for sip of liquid, with which he was able to clear solid cracker. Suspect pt's dysphagia  is behavioral, possibly psyschological in nature; aversion may be due to underlying GI issues. Provided education re: compensations including thorough mastication, upright posture during/after meals, following solids with liquids. Encouraged pt to try blended foods at home such as soups, smoothies if he continues to have difficulties with solids. Will defer diet order to MD as pt currently on clear liquids due to vomiting. No further follow-up recommend for ST at this time; SLP will s/o.   SLP Visit Diagnosis: Dysphagia, unspecified (R13.10)    Aspiration Risk  Mild aspiration risk    Diet Recommendation Regular;Thin liquid   Liquid Administration via: Cup;Straw Medication Administration: Other (Comment) (per pt preference) Supervision: Patient able to self feed Compensations: Slow rate;Small sips/bites;Follow solids with liquid Postural Changes: Seated upright at 90 degrees;Remain upright for at least 30 minutes after po intake    Other  Recommendations Oral Care Recommendations: Oral care BID   Follow up Recommendations None      Frequency and Duration            Prognosis Prognosis for Safe Diet Advancement: Fair Barriers to Reach Goals: Other (Comment) (cormorbidities)      Swallow Study   General Date of Onset: 11/23/17 HPI: 31 year old male PMH AIDS presented to the emergency department from ID clinic with ongoing diarrhea, found to have tachycardia and dehydration. Seen by gastroenterology for chronic diarrhea, failure to thrive, weight loss. No colonoscopy recommended. CT abdomen 10/29 revealed bilateral pleural effusions, bibasilar densities, predominantly involving the right lung base are concerning for an infectious process; thickened appearance of the distal esophagus as well as diffusely thickened small bowel. Type of Study: Bedside Swallow Evaluation Previous Swallow Assessment: none in chart  Diet Prior to this Study: Thin liquids Temperature Spikes Noted:  No Respiratory Status: Room air History of Recent Intubation: No Behavior/Cognition: Alert;Cooperative Oral Cavity Assessment: Within Functional Limits Oral Care Completed by SLP: No Oral Cavity - Dentition: Adequate natural dentition Vision: Functional for self-feeding Self-Feeding Abilities: Able to feed self Patient Positioning: Upright in bed Baseline Vocal Quality: Normal Volitional Cough: Strong Volitional Swallow: Able to elicit    Oral/Motor/Sensory Function Overall Oral Motor/Sensory Function: Within functional limits   Ice Chips Ice chips: Not tested   Thin Liquid Thin Liquid: Within functional limits Presentation: Cup;Straw;Self Fed    Nectar Thick Nectar Thick Liquid: Not tested   Honey Thick Honey Thick Liquid: Not tested   Puree Puree: Within functional limits Presentation: Spoon;Self Fed   Solid   GO   Shon HaleMary Beth Crooked CreekBardin, TennesseeMS, CCC-SLP Speech-Language Pathologist [phone] Solid: Impaired Presentation: Self Fed Oral Phase Functional Implications: Oral holding;Prolonged oral transit        Arlana LindauMary E Deone Omahoney 11/01/2017,5:45 PM

## 2017-11-02 ENCOUNTER — Encounter (HOSPITAL_COMMUNITY): Payer: Self-pay | Admitting: Family Medicine

## 2017-11-02 DIAGNOSIS — B20 Human immunodeficiency virus [HIV] disease: Secondary | ICD-10-CM

## 2017-11-02 DIAGNOSIS — A084 Viral intestinal infection, unspecified: Secondary | ICD-10-CM

## 2017-11-02 DIAGNOSIS — D638 Anemia in other chronic diseases classified elsewhere: Secondary | ICD-10-CM

## 2017-11-02 HISTORY — DX: Anemia in other chronic diseases classified elsewhere: D63.8

## 2017-11-02 LAB — BASIC METABOLIC PANEL
Anion gap: 4 — ABNORMAL LOW (ref 5–15)
BUN: 29 mg/dL — AB (ref 6–20)
CALCIUM: 6.3 mg/dL — AB (ref 8.9–10.3)
CO2: 16 mmol/L — ABNORMAL LOW (ref 22–32)
CREATININE: 0.83 mg/dL (ref 0.61–1.24)
Chloride: 114 mmol/L — ABNORMAL HIGH (ref 101–111)
GFR calc Af Amer: >60 mL/min (ref >60)
GLUCOSE: 110 mg/dL — AB (ref 65–99)
Potassium: 3.1 mmol/L — ABNORMAL LOW (ref 3.5–5.1)
SODIUM: 134 mmol/L — AB (ref 135–145)

## 2017-11-02 LAB — GLUCOSE, CAPILLARY
GLUCOSE-CAPILLARY: 104 mg/dL — AB (ref 65–99)
GLUCOSE-CAPILLARY: 101 mg/dL — AB (ref 65–99)
Glucose-Capillary: 103 mg/dL — ABNORMAL HIGH (ref 65–99)
Glucose-Capillary: 104 mg/dL — ABNORMAL HIGH (ref 65–99)
Glucose-Capillary: 126 mg/dL — ABNORMAL HIGH (ref 65–99)

## 2017-11-02 LAB — CBC
HCT: 24.9 % — ABNORMAL LOW (ref 39.0–52.0)
Hemoglobin: 8.7 g/dL — ABNORMAL LOW (ref 13.0–17.0)
MCH: 28.7 pg (ref 26.0–34.0)
MCHC: 34.9 g/dL (ref 30.0–36.0)
MCV: 82.2 fL (ref 78.0–100.0)
PLATELETS: 59 10*3/uL — AB (ref 150–400)
RBC: 3.03 MIL/uL — ABNORMAL LOW (ref 4.22–5.81)
RDW: 17.2 % — AB (ref 11.5–15.5)
WBC: 8 10*3/uL (ref 4.0–10.5)

## 2017-11-02 LAB — MAGNESIUM: MAGNESIUM: 1.7 mg/dL (ref 1.7–2.4)

## 2017-11-02 LAB — PHOSPHORUS: PHOSPHORUS: 3.4 mg/dL (ref 2.5–4.6)

## 2017-11-02 MED ORDER — POTASSIUM CHLORIDE 10 MEQ/100ML IV SOLN
10.0000 meq | INTRAVENOUS | Status: AC
  Administered 2017-11-02 (×4): 10 meq via INTRAVENOUS
  Filled 2017-11-02 (×4): qty 100

## 2017-11-02 NOTE — Progress Notes (Signed)
PROGRESS NOTE  Anthony FriendsMarcus L Rotenberry AVW:[id number] DOB: 1986-05-23 DOA: 10/26/2017 PCP: Patient, No Pcp Per  CM scheduled post hospital follow up for 11/14/2017 at 1:00 pm [facility].  Brief Narrative: 31 year old male PMH AIDS presented to the emergency department from ID clinic with ongoing diarrhea, found to have tachycardia and dehydration.  Seen by gastroenterology for chronic diarrhea, failure to thrive, weight loss.  No colonoscopy recommended.  Recommended treating underlying illness.  Followed by infectious disease with further studies sent.  He developed hypotension and tachycardia refractory to IV fluids and was transferred to stepdown unit and seen in consultation with critical care.  Assessment/Plan Chronic diarrhea, dehydration. Recent giardia. GI pathogen panel negative. C. difficile negative. - improving, less diarrhea. No abdominal pain. No endoscopy per GI. Continue albendazole today per ID   Vomiting. CT abd/pelvis 10/29 without acute features. Portable AXR 11/1 no acute features. GI rec against upper/lower endoscopy. - a little better today. Has been able to drink some liquids. No nausea. Aggravated by hiccups which are transient. - abd exam remains benign. Presume secondary to GI process/AIDS - PRN Compazine, PRN Reglan, Marinol, baclofen (hiccups)  Microcytic anemia, anemia of critical illness/AIDS. Parvovirus abs negative. -improved s/p 2 units PRBC transfusion 11/3.  FTT with reported 50lb weight loss. -tolerating TF  -Mg, phos, K+ remain adequate.   AIDS with associated wasting, FTT, weight loss. CD4 count 12, VL 611k. Off ART 2 years. Started on Triumeq 08/2017. -continue biktarvy; daily Batcrim and weekly azithromycin for OI prophylaxis.   Pseudo-hypocalcemia. -Corrected calcium 8.7 given severe hypoalbuminemia. -Tums  Non-AG metabolic acidosis -stable, secondary to diarrhea  Chronic thrombocytopenia - slightly lower. Follow with repeat CBC in 48  hours.  Hypovolemic hyponatremia  - minimal. Has improved with TF.  Severe malnutrition in the context of chronic illness, albumin <1.0 -continue Megace   Condition remains guarded. Will f/u ID recs 11/5. Will reconsult GI for further recs 11/5 unless continues to improve.  DVT prophylaxis: SCDs Code Status: full Family Communication: father and mother at bedside 11/4 Disposition Plan: home    Brendia Sacksaniel Annaleah Arata, MD  [facility] Direct contact: [phone] --Via amion app OR  --[email or web address]; password [email or web address]  7PM-7AM contact night coverage as above 11/02/2017, 2:43 PM  LOS: 10 days   Consultants:  ID  GI  PCCM  Procedures:  Transfusion 1 unit PRBC 10/26  Transfusion 2 unit PRBC 10/27  Transfusion 2 unit PRBC 11/3  Antimicrobials:  Albendazole 10/30 >>  Interval history/Subjective: ST evaluation noted. Recommended regular diet, thin liquid.  Diarrhea continues to slow down. No nausea. Vomiting lessening. Hiccups transient. No abdominal pain.  Objective: Vitals:  Vitals:   11/02/17 0852 11/02/17 1146  BP: 114/65 111/66  Pulse: (!) 109 (!) 108  Resp: (!) 22 18  Temp:  98.6 F (37 C)  SpO2: 99% 99%    Exam:  Constitutional:   . Appears chronically ill, debilitated Respiratory:  . CTA bilaterally, no w/r/r.  . Respiratory effort normal.   Cardiovascular:  . RRR, no m/r/g . 1-2+ bilateral pedal edema L > R   Abdomen:  . Soft, ntnd Psychiatric:  . Mental status o Mood, affect appropriate   I have personally reviewed the following:   1 stool recorded  Labs:  CBG remains stable  Hgb 7.7 >> 5.8 >> 8.7  Plts 79 >> 67 >> 59  BMP noted, potassium 3.1.  Magnesium and phosphorus WNL  Imaging studies:     Scheduled Meds: . albendazole  400 mg Per Tube BID WC  . azithromycin  1,200 mg Oral Weekly  . bictegravir-emtricitabine-tenofovir AF  1 tablet Oral Daily  . dronabinol  2.5 mg Oral BID AC  . megestrol  400 mg Oral Daily  .  multivitamin  5 mL Oral Daily  . sodium chloride flush  10-40 mL Intracatheter Q12H  . sodium chloride flush  3 mL Intravenous Q12H  . sulfamethoxazole-trimethoprim  20 mL Oral Daily   Continuous Infusions: . sodium chloride    . sodium chloride    . feeding supplement (VITAL 1.5 CAL) 1,000 mL (11/01/17 1719)    Principal Problem:   Chronic diarrhea Active Problems:   AIDS (acquired immune deficiency syndrome) (HCC)   Non-compliance   Microcytic anemia   Protein-calorie malnutrition, severe   Dehydration   Thrombocytopenia (HCC)   LOS: 10 days

## 2017-11-02 NOTE — Progress Notes (Signed)
CRITICAL VALUE ALERT  Critical Value: CALCIUM 6.3  Date & Time Notied:  11/02/2017  Provider Notified: BLOUNT  Orders Received/Actions taken:Awaiting order Re-paged at 05:12: still awaiting order Re-paged at 06:08:  Still awaiting order 06:20: Heard back. No new order received from provider.

## 2017-11-03 ENCOUNTER — Other Ambulatory Visit: Payer: Self-pay

## 2017-11-03 LAB — GLUCOSE, CAPILLARY
GLUCOSE-CAPILLARY: 105 mg/dL — AB (ref 65–99)
GLUCOSE-CAPILLARY: 130 mg/dL — AB (ref 65–99)
GLUCOSE-CAPILLARY: 104 mg/dL — AB (ref 65–99)
GLUCOSE-CAPILLARY: 107 mg/dL — AB (ref 65–99)
Glucose-Capillary: 92 mg/dL (ref 65–99)
Glucose-Capillary: 98 mg/dL (ref 65–99)

## 2017-11-03 LAB — MAGNESIUM: Magnesium: 1.5 mg/dL — ABNORMAL LOW (ref 1.7–2.4)

## 2017-11-03 MED ORDER — ADULT MULTIVITAMIN LIQUID CH
5.0000 mL | Freq: Every day | ORAL | Status: DC
  Administered 2017-11-04 – 2017-11-07 (×2): 5 mL via ORAL
  Filled 2017-11-03 (×4): qty 15

## 2017-11-03 MED ORDER — MAGNESIUM SULFATE 2 GM/50ML IV SOLN
2.0000 g | Freq: Once | INTRAVENOUS | Status: AC
  Administered 2017-11-03: 2 g via INTRAVENOUS
  Filled 2017-11-03: qty 50

## 2017-11-03 NOTE — Progress Notes (Signed)
PROGRESS NOTE  Anthony Yang ZOX:[id number] DOB: 1986-05-22 DOA: 11-01-17 PCP: Patient, No Pcp Per  CM scheduled post hospital follow up for 11/14/2017 at 1:00 pm @ [facility].  Brief Narrative: 31 year old male PMH AIDS presented to the emergency department from ID clinic with ongoing diarrhea, found to have tachycardia and dehydration.  Seen by gastroenterology for chronic diarrhea, failure to thrive, weight loss.  No colonoscopy recommended.  Recommended treating underlying illness.  Followed by infectious disease with further studies sent.  He developed hypotension and tachycardia refractory to IV fluids and was transferred to stepdown unit and seen in consultation with critical care.  Assessment/Plan Chronic diarrhea, dehydration. Recent giardia. GI pathogen panel negative. C. difficile negative. - pt minimizing per RN, still 5+ stools per day - no abd pain. F/u GI recs. - management per ID and GI  Vomiting. CT abd/pelvis 10/29 without acute features. Portable AXR 11/1 no acute features. GI rec against upper/lower endoscopy. - no apparent change. No nausea, no pain. Transient hiccups only. - reluctant to take ordered antiemetics--thinks Zofran and Phenergan made things worse - await further GI recs  Microcytic anemia, anemia of critical illness/AIDS. Parvovirus abs negative. - improved s/p 2 units PRBC transfusion 11/3. - CBC in AM  FTT with reported 50lb weight loss. -tolerating TF but have not yet been advanced. D/w RN--plan advance Q12 hours. -Mg, low.  AIDS with associated wasting, FTT, weight loss. CD4 count 12, VL 611k. Off ART 2 years. Started on Triumeq 08/2017. -continue biktarvy; daily Batcrim and weekly azithromycin for OI prophylaxis.   Pseudo-hypocalcemia.  Non-AG metabolic acidosis -stable, secondary to diarrhea  Chronic thrombocytopenia - follow q3 days  Hypovolemic hyponatremia  - minimal. Has improved with TF.  Severe malnutrition in the  context of chronic illness, albumin <1.0 - continue Megace  Prognosis remains guarded. Discussed with ID. Defer treatment to ID. Ok with TPN if ID wants to proceed. Discussed with patient at bedside. Discussed with dietician and bedside RN. Time >35 minutes in counseling and coordination of care as above   DVT prophylaxis: SCDs Code Status: full Family Communication:   Disposition Plan: home    Brendia Sacks, MD  [facility] Direct contact: [phone] --Via amion app OR  --[email or web address]; password [email or web address]  7PM-7AM contact night coverage as above 11/03/2017, 10:37 AM  LOS: 11 days   Consultants:  ID  GI  PCCM  Procedures:  Transfusion 1 unit PRBC 10/26  Transfusion 2 unit PRBC 10/27  Transfusion 2 unit PRBC 11/3  Antimicrobials:  Albendazole 10/30 >>  Interval history/Subjective: Feels ok, reports little diarrhea. Continues to have vomiting and hiccups.  RN reports 5 stools yesterday, concerned pt minimizes symptoms.  Objective: Vitals:  Vitals:   11/03/17 0453 11/03/17 0800  BP:  109/66  Pulse:  (!) 120  Resp:  (!) 23  Temp: 98.7 F (37.1 C)   SpO2:  97%    Exam:   Constitutional:  . Appears ill, non-toxic Eyes:  . pupils and irises appear normal ENMT:  . grossly normal hearing  . Lips appear normal Respiratory:  . CTA bilaterally, no w/r/r.  . Respiratory effort normal.  Cardiovascular:  . RRR, no m/r/g . Bilateral pedal edema 1+   Abdomen:  . Soft, ntnd Psychiatric:  . Mental status o Mood, affect appropriate  I have personally reviewed the following:   UOP: 200+ I/O since admission: inaccurate Last BM charted: 11/4 (1) Foley: no Telemetry: ST Status: SDU   Labs:  CBG stable  Mg 1.5  Imaging studies:     Scheduled Meds: . albendazole  400 mg Per Tube BID WC  . azithromycin  1,200 mg Oral Weekly  . bictegravir-emtricitabine-tenofovir AF  1 tablet Oral Daily  . dronabinol  2.5 mg Oral BID AC  . megestrol   400 mg Oral Daily  . multivitamin  5 mL Oral Daily  . sodium chloride flush  10-40 mL Intracatheter Q12H  . sodium chloride flush  3 mL Intravenous Q12H  . sulfamethoxazole-trimethoprim  20 mL Oral Daily   Continuous Infusions: . sodium chloride    . sodium chloride    . feeding supplement (VITAL 1.5 CAL) 1,000 mL (11/02/17 1750)  . magnesium sulfate 1 - 4 g bolus IVPB 2 g (11/03/17 0956)    Principal Problem:   Chronic diarrhea Active Problems:   AIDS (acquired immune deficiency syndrome) (HCC)   Non-compliance   Protein-calorie malnutrition, severe   Dehydration   Thrombocytopenia (HCC)   Anemia in other chronic diseases classified elsewhere   AIDS-associated secretory diarrhea (HCC)   LOS: 11 days

## 2017-11-03 NOTE — Progress Notes (Signed)
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 albendazole  400 mg Per Tube BID WC  . azithromycin  1,200 mg Oral Weekly  . bictegravir-emtricitabine-tenofovir AF  1 tablet Oral Daily  . dronabinol  2.5 mg Oral BID AC  . megestrol  400 mg Oral Daily  . multivitamin  5 mL Oral Daily  . sodium chloride flush  10-40 mL Intracatheter Q12H  . sodium chloride flush  3 mL Intravenous Q12H  . sulfamethoxazole-trimethoprim  20 mL Oral Daily    SUBJECTIVE: Diarrhea significantly improved with albendazole. Continues to take his Biktarvy daily. Has had problems with vomiting over the weekend. Has continued to throw up bilious emesis (no tube feed) and thought this to be a/w some of the antiemetics he was receiving. Would really like some ice cream more than anything.   Review of Systems: Review of Systems  Constitutional: Positive for fever. Negative for chills.  HENT: Negative for tinnitus.   Eyes: Negative for blurred vision and photophobia.  Respiratory: Negative for cough and sputum production.   Cardiovascular: Negative for chest pain and palpitations.  Gastrointestinal: Negative for abdominal pain, diarrhea, nausea and vomiting.       Hiccups   Genitourinary: Negative for dysuria.  Skin: Negative for rash.  Neurological: Negative for headaches.  Psychiatric/Behavioral: Positive for depression.    Allergies  Allergen Reactions  . Penicillins Hives    Has patient had a PCN reaction causing immediate rash, facial/tongue/throat swelling, SOB or lightheadedness with hypotension: Yes Has patient had a PCN  reaction causing severe rash involving mucus membranes or skin necrosis: No Has patient had a PCN reaction that required hospitalization: No Has patient had a PCN reaction occurring within the last 10 years: No If all of the above answers are "NO", then may proceed with Cephalosporin use.    OBJECTIVE: Vitals:   11/03/17 0009 11/03/17 0400 11/03/17 0453 11/03/17 0800  BP:  (!) 106/54  109/66  Pulse:  (!) 121  (!) 120  Resp:  (!) 21  (!) 23  Temp: 99.9 F (37.7 C)  98.7 F (37.1 C)   TempSrc: Oral  Oral   SpO2:  98%  97%  Weight:      Height:       Body mass index is 23.08 kg/m.  Physical Exam  Constitutional: He is oriented to person, place, and time. He appears malnourished. He appears cachectic.  Eyes: No scleral icterus.  Cardiovascular: Regular rhythm and normal heart sounds. Tachycardia present.  No murmur heard. Pulmonary/Chest: Effort normal and breath sounds normal.  Abdominal: Soft. Bowel sounds are normal. He exhibits no distension. There is no tenderness.  Lymphadenopathy:    He has no cervical adenopathy.  Neurological: He is alert and oriented to person, place, and time.  Skin: Skin is warm and dry.  Psychiatric: Affect normal.    Lab Results Lab Results  Component Value Date   WBC 8.0 11/02/2017   HGB 8.7 (L) 11/02/2017   HCT 24.9 (L) 11/02/2017   MCV 82.2 11/02/2017   PLT 59 (L) 11/02/2017  Lab Results  Component Value Date   CREATININE 0.83 11/02/2017   BUN 29 (H) 11/02/2017   NA 134 (L) 11/02/2017   K 3.1 (L) 11/02/2017   CL 114 (H) 11/02/2017   CO2 16 (L) 11/02/2017    Lab Results  Component Value Date   ALT 18 11/01/2017   AST 26 11/01/2017   ALKPHOS 273 (H) 11/01/2017   BILITOT 0.8 11/01/2017     Microbiology: BCx 10/25 >> NG x 4d  BCx 10/28 >> pending Bone Marrow Cx 10/1 >> negative AFB CDiff Ag 10/25 >> neg GI PCR Panel 10/25 >> negative CMV PCR >> ~35,000   ASSESSMENT: 31 yo AA male with advanced HIV/AIDS with chronic  diarrhea and severe malnutrition 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 MAC excluded with negative BM Bx.   D/W dietary team - at this point he has been unable to have TF increased with vomiting, however his enteral tube is post pyloric and emesis does not appear to have TF contents per patient. I am hopeful that this can be increased as it does not appear he has ever been close to established goal. Still taking in very little PO and now on clear liquids. Recommending consideration of TNA.   Father hopeful he will D/C home mid week, however I am unsure with nutritional goals unmet this will happen.   PLAN:  D/W Dr. Sarajane Jews regarding adding TNA to nutrition plan for short-term bridging therapy and maintaining enteral feeds if possible (OK in discussion with RD). He does not appear to be absorbing much if anything enterally/PO.   Continue albendazole - does not appear this is on [facility] formulary. Will need to arrange with [facility] outpatient pharmacy.   He has been getting Biktarvy at same time as MVI - will push MVI to evening dose.   Will arrange for FU in [facility] next week (Monday 11/12) per his father's request to keep close eye on him. Also has visit with PCP Friday 11/16. Will arrange for FU with Marcie Bal our clinic counselor as he and his father are interested.    Janene Madeira, MSN, NP-C [facility] Pager: [phone]  11/03/2017  10:13 AM

## 2017-11-04 DIAGNOSIS — R1114 Bilious vomiting: Secondary | ICD-10-CM

## 2017-11-04 LAB — CBC
HEMATOCRIT: 18.5 % — AB (ref 39.0–52.0)
HEMOGLOBIN: 6.4 g/dL — AB (ref 13.0–17.0)
MCH: 29 pg (ref 26.0–34.0)
MCHC: 34.6 g/dL (ref 30.0–36.0)
MCV: 83.7 fL (ref 78.0–100.0)
Platelets: 32 10*3/uL — ABNORMAL LOW (ref 150–400)
RBC: 2.21 MIL/uL — AB (ref 4.22–5.81)
RDW: 19.7 % — ABNORMAL HIGH (ref 11.5–15.5)
WBC: 3.4 10*3/uL — ABNORMAL LOW (ref 4.0–10.5)

## 2017-11-04 LAB — GLUCOSE, CAPILLARY
GLUCOSE-CAPILLARY: 109 mg/dL — AB (ref 65–99)
GLUCOSE-CAPILLARY: 174 mg/dL — AB (ref 65–99)
GLUCOSE-CAPILLARY: 93 mg/dL (ref 65–99)
Glucose-Capillary: 111 mg/dL — ABNORMAL HIGH (ref 65–99)
Glucose-Capillary: 108 mg/dL — ABNORMAL HIGH (ref 65–99)
Glucose-Capillary: 128 mg/dL — ABNORMAL HIGH (ref 65–99)

## 2017-11-04 LAB — HEPATIC FUNCTION PANEL
ALK PHOS: 162 U/L — AB (ref 38–126)
ALT: 19 U/L (ref 17–63)
AST: 34 U/L (ref 15–41)
BILIRUBIN DIRECT: 0.2 mg/dL (ref 0.1–0.5)
BILIRUBIN TOTAL: 0.7 mg/dL (ref 0.3–1.2)
Indirect Bilirubin: 0.5 mg/dL (ref 0.3–0.9)
Total Protein: 4 g/dL — ABNORMAL LOW (ref 6.5–8.1)

## 2017-11-04 LAB — BASIC METABOLIC PANEL
ANION GAP: 3 — AB (ref 5–15)
BUN: 23 mg/dL — ABNORMAL HIGH (ref 6–20)
CHLORIDE: 110 mmol/L (ref 101–111)
CO2: 20 mmol/L — AB (ref 22–32)
Calcium: 6 mg/dL — CL (ref 8.9–10.3)
Creatinine, Ser: 0.71 mg/dL (ref 0.61–1.24)
GFR calc Af Amer: >60 mL/min (ref >60)
GLUCOSE: 115 mg/dL — AB (ref 65–99)
POTASSIUM: 3.8 mmol/L (ref 3.5–5.1)
Sodium: 133 mmol/L — ABNORMAL LOW (ref 135–145)

## 2017-11-04 LAB — PREPARE RBC (CROSSMATCH)

## 2017-11-04 LAB — MAGNESIUM: MAGNESIUM: 1.7 mg/dL (ref 1.7–2.4)

## 2017-11-04 MED ORDER — SODIUM CHLORIDE 0.9 % IV BOLUS (SEPSIS)
1000.0000 mL | Freq: Once | INTRAVENOUS | Status: AC
  Administered 2017-11-04: 1000 mL via INTRAVENOUS

## 2017-11-04 MED ORDER — CALCIUM CARBONATE ANTACID 500 MG PO CHEW: 1.0000 | CHEWABLE_TABLET | Freq: Three times a day (TID) | ORAL | Status: DC

## 2017-11-04 MED ORDER — SODIUM CHLORIDE 0.9 % IV BOLUS (SEPSIS)
500.0000 mL | Freq: Once | INTRAVENOUS | Status: AC
  Administered 2017-11-04: 500 mL via INTRAVENOUS

## 2017-11-04 MED ORDER — MAGNESIUM SULFATE 2 GM/50ML IV SOLN
2.0000 g | Freq: Once | INTRAVENOUS | Status: AC
  Administered 2017-11-04: 2 g via INTRAVENOUS
  Filled 2017-11-04: qty 50

## 2017-11-04 MED ORDER — SODIUM CHLORIDE 0.9 % IV SOLN
Freq: Once | INTRAVENOUS | Status: AC
  Administered 2017-11-04: 21:00:00 via INTRAVENOUS

## 2017-11-04 MED ORDER — CALCIUM CARBONATE ANTACID 500 MG PO CHEW
2.0000 | CHEWABLE_TABLET | Freq: Three times a day (TID) | ORAL | Status: DC
  Administered 2017-11-04 – 2017-11-06 (×7): 400 mg via ORAL
  Filled 2017-11-04 (×8): qty 2

## 2017-11-04 MED ORDER — CHLORPROMAZINE HCL 25 MG PO TABS
25.0000 mg | ORAL_TABLET | Freq: Three times a day (TID) | ORAL | Status: AC
Start: 2017-11-04 — End: 2017-11-04
  Administered 2017-11-04 (×3): 25 mg via ORAL
  Filled 2017-11-04 (×3): qty 1

## 2017-11-04 NOTE — Progress Notes (Signed)
CRITICAL VALUE ALERT  Critical Value: calcium 6.0 Date & Time Notied:  11/04/2017  Provider Notified: Toniann FailKakrakandy MD  Orders Received/Actions taken: Hepatic functional panel ordered

## 2017-11-04 NOTE — Progress Notes (Signed)
Pt's HR at 150 and above sustained, BP 106/63 (77) Resp 20. Paged MD. Order given 1L bolus NS over 1 hour.

## 2017-11-04 NOTE — Progress Notes (Signed)
CRITICAL VALUE ALERT  Critical Value:  Hgb 6.4  Date & Time Notied:  11/04/17 at 20:05  Provider Notified: Toniann FailKakrakandy, MD  Orders Received/Actions taken: Give 1 Unit of PRBCs.

## 2017-11-04 NOTE — Progress Notes (Signed)
PT Cancellation Note  Patient Details Name: Anthony Yang MRN: [id number] DOB: 06-08-1986   Cancelled Treatment:    Reason Eval/Treat Not Completed: Patient not medically ready. Pt asleep with HR in 150s. RN aware and discussed that PT will follow-up for PT evaluation tomorrow. Father present and also made aware of this.  Ina HomesJaclyn Darrah Dredge, PT, DPT Acute Rehab Services  Pager: [phone]  Malachy ChamberJaclyn L Tonae Livolsi 11/04/2017, 4:44 PM

## 2017-11-04 NOTE — Progress Notes (Addendum)
PROGRESS NOTE  Verlan FriendsMarcus L Laprise ZOX:[id number] DOB: Mar 10, 1986 DOA: 10/13/2017 PCP: Patient, No Pcp Per  CM scheduled post hospital follow up for 11/14/2017 at 1:00 [facility]  Brief Narrative: 31 year old male PMH AIDS presented to the emergency department from ID clinic with ongoing diarrhea, found to have tachycardia and dehydration.  Seen by gastroenterology for chronic diarrhea, failure to thrive, weight loss.  No colonoscopy or EGD recommended.  Recommended treating underlying illness.  Followed by infectious disease with further studies sent.  He developed hypotension and tachycardia refractory to IV fluids and was transferred to stepdown unit and seen in consultation with critical care. Hemodynamics have subsequently remained stable but appetite has been poor and vomiting has been an issue. Cortrak tube placed for nutrition with poor advancement. Barriers to d/c: vomiting, poor oral intake, diarrhea. ID considering TPN.  Assessment/Plan Chronic diarrhea, dehydration. Recent giardia. GI pathogen panel negative. C. difficile negative. - pt continues to underreport diarrhea.   - no abdominal pain - defer management to ID  Vomiting. CT abd/pelvis 10/29 without acute features. Portable AXR 11/1 no acute features. GI rec against upper/lower endoscopy. - no change but not taking any antiemetics except Reglan (reluctant to take Zofran, Phenergan). On Marinol but no apparent effect thus far.  - wants to try solid food - may be related to hiccups. Discussed with pharmacist who reviewed meds. Will try Thorazine today x3 days and assess QTc in AM. If effective and continued, monitor QTc periodically while on azithromycin and trazodone.   Microcytic anemia, anemia of critical illness/AIDS. Parvovirus abs negative. - improved s/p 2 units PRBC transfusion 11/3. - CBC 11/7  FTT with reported 50lb weight loss. - advance TF 10 ml/hour Q12 hours per RD - check Mg and phosphorus  11/7  AIDS with associated wasting, FTT, weight loss. CD4 count 12, VL 611k. Off ART 2 years. Started on Triumeq 08/2017. -continue biktarvy; daily Batcrim and weekly azithromycin for OI prophylaxis.  - I discussed with Ms. Dixon yesterday and requested ID discuss patient's prognosis with patient and consider involving palliative care.  Mild hypocalcemia. -corrected 8.4  Non-AG metabolic acidosis - stable, CO2 trending up, secondary to diarrhea  Chronic thrombocytopenia - follow q3 days, next 11/7  Hypovolemic hyponatremia  - minimal. Has improved with TF.  Severe malnutrition in the context of chronic illness, albumin <1.0 - continue Megace     DVT prophylaxis: SCDs Code Status: full Family Communication:   Disposition Plan: home    Brendia Sacksaniel Lavender Stanke, MD  [facility] Direct contact: [phone] --Via amion app OR  --[email or web address]; password [email or web address]  7PM-7AM contact night coverage as above 11/04/2017, 9:52 AM  LOS: 12 days   Consultants:  ID  GI  PCCM  Procedures:  Transfusion 1 unit PRBC 10/26  Transfusion 2 unit PRBC 10/27  Transfusion 2 unit PRBC 11/3  Antimicrobials:  Albendazole 10/30 >>  Interval history/Subjective: Feels ok, no pain. Continues to have hiccups. Vomiting was "bad" yesterday but better today. Diarrhea minimal. Tolerating some liquids.  Objective: Vitals:  Vitals:   11/04/17 0417 11/04/17 0800  BP:  117/81  Pulse:  (!) 119  Resp: (!) 21 (!) 22  Temp:  97.8 F (36.6 C)  SpO2:      Exam: Constitutional:  . Appears calm, ill, uncomfortable, debilitated but not toxic Eyes:  . pupils and irises appear normal ENMT:  . grossly normal hearing  Respiratory:  . CTA bilaterally, no w/r/r.  . Respiratory effort normal.  Cardiovascular:  .  RRR, no m/r/g . No LE extremity edema   Abdomen:  . Soft, non-tender, mild to moderate distension Psychiatric:  . Mental status o Mood, affect appropriate  I have personally reviewed  the following:   UOP: 1400 I/O since admission: inaccurate Last BM charted: 11/5 (4) Foley: no Telemetry: ST Status: SDU   Labs:  CBG stable  BMP unremarkable. CO2 is trending up  Calcium corrected is approx 8.4  Imaging studies:     Scheduled Meds: . albendazole  400 mg Per Tube BID WC  . azithromycin  1,200 mg Oral Weekly  . bictegravir-emtricitabine-tenofovir AF  1 tablet Oral Daily  . calcium carbonate  2 tablet Oral TID  . dronabinol  2.5 mg Oral BID AC  . megestrol  400 mg Oral Daily  . multivitamin  5 mL Oral Daily  . sodium chloride flush  10-40 mL Intracatheter Q12H  . sodium chloride flush  3 mL Intravenous Q12H  . sulfamethoxazole-trimethoprim  20 mL Oral Daily   Continuous Infusions: . sodium chloride    . sodium chloride    . feeding supplement (VITAL 1.5 CAL) 1,000 mL (11/03/17 1656)    Principal Problem:   Chronic diarrhea Active Problems:   AIDS (acquired immune deficiency syndrome) (HCC)   Non-compliance   Protein-calorie malnutrition, severe   Dehydration   Thrombocytopenia (HCC)   Anemia in other chronic diseases classified elsewhere   AIDS-associated secretory diarrhea (HCC)   LOS: 12 days   Time >35 minutes in counseling and coordination of care

## 2017-11-05 DIAGNOSIS — E871 Hypo-osmolality and hyponatremia: Secondary | ICD-10-CM

## 2017-11-05 DIAGNOSIS — R066 Hiccough: Secondary | ICD-10-CM

## 2017-11-05 DIAGNOSIS — B20 Human immunodeficiency virus [HIV] disease: Principal | ICD-10-CM

## 2017-11-05 DIAGNOSIS — R000 Tachycardia, unspecified: Secondary | ICD-10-CM

## 2017-11-05 DIAGNOSIS — Z6823 Body mass index (BMI) 23.0-23.9, adult: Secondary | ICD-10-CM

## 2017-11-05 DIAGNOSIS — E43 Unspecified severe protein-calorie malnutrition: Secondary | ICD-10-CM

## 2017-11-05 DIAGNOSIS — R5081 Fever presenting with conditions classified elsewhere: Secondary | ICD-10-CM

## 2017-11-05 DIAGNOSIS — Z880 Allergy status to penicillin: Secondary | ICD-10-CM

## 2017-11-05 DIAGNOSIS — E876 Hypokalemia: Secondary | ICD-10-CM

## 2017-11-05 LAB — TYPE AND SCREEN
ABO/RH(D): B POS
Antibody Screen: NEGATIVE
UNIT DIVISION: 0
UNIT DIVISION: 0
Unit division: 0

## 2017-11-05 LAB — BPAM RBC
BLOOD PRODUCT EXPIRATION DATE: 201811122359
Blood Product Expiration Date: 201811082359
Blood Product Expiration Date: 201811122359
ISSUE DATE / TIME: 201811030913
ISSUE DATE / TIME: 201811031413
ISSUE DATE / TIME: 201811062048
UNIT TYPE AND RH: 7300
Unit Type and Rh: 7300
Unit Type and Rh: 7300

## 2017-11-05 LAB — CBC
HEMATOCRIT: 24.3 % — AB (ref 39.0–52.0)
Hemoglobin: 8.5 g/dL — ABNORMAL LOW (ref 13.0–17.0)
MCH: 29.3 pg (ref 26.0–34.0)
MCHC: 35 g/dL (ref 30.0–36.0)
MCV: 83.8 fL (ref 78.0–100.0)
Platelets: 29 10*3/uL — CL (ref 150–400)
RBC: 2.9 MIL/uL — AB (ref 4.22–5.81)
RDW: 18.4 % — AB (ref 11.5–15.5)
WBC: 4.4 10*3/uL (ref 4.0–10.5)

## 2017-11-05 LAB — BASIC METABOLIC PANEL
ANION GAP: 3 — AB (ref 5–15)
BUN: 22 mg/dL — ABNORMAL HIGH (ref 6–20)
CHLORIDE: 110 mmol/L (ref 101–111)
CO2: 20 mmol/L — ABNORMAL LOW (ref 22–32)
CREATININE: 0.7 mg/dL (ref 0.61–1.24)
Calcium: 5.5 mg/dL — CL (ref 8.9–10.3)
GFR calc non Af Amer: >60 mL/min (ref >60)
Glucose, Bld: 113 mg/dL — ABNORMAL HIGH (ref 65–99)
POTASSIUM: 3.3 mmol/L — AB (ref 3.5–5.1)
SODIUM: 133 mmol/L — AB (ref 135–145)

## 2017-11-05 LAB — GLUCOSE, CAPILLARY
GLUCOSE-CAPILLARY: 117 mg/dL — AB (ref 65–99)
Glucose-Capillary: 108 mg/dL — ABNORMAL HIGH (ref 65–99)
Glucose-Capillary: 115 mg/dL — ABNORMAL HIGH (ref 65–99)
Glucose-Capillary: 120 mg/dL — ABNORMAL HIGH (ref 65–99)

## 2017-11-05 LAB — PHOSPHORUS: PHOSPHORUS: 2.8 mg/dL (ref 2.5–4.6)

## 2017-11-05 LAB — MAGNESIUM: Magnesium: 1.8 mg/dL (ref 1.7–2.4)

## 2017-11-05 LAB — MICROSPORIDIA SPORE STAIN, FECES: Microsporidia Spore: NEGATIVE

## 2017-11-05 MED ORDER — POTASSIUM CHLORIDE 20 MEQ PO PACK
40.0000 meq | PACK | ORAL | Status: AC
  Administered 2017-11-05: 40 meq via NASOGASTRIC
  Filled 2017-11-05 (×2): qty 2

## 2017-11-05 MED ORDER — MAGNESIUM SULFATE 2 GM/50ML IV SOLN
2.0000 g | Freq: Once | INTRAVENOUS | Status: AC
  Administered 2017-11-05: 2 g via INTRAVENOUS
  Filled 2017-11-05: qty 50

## 2017-11-05 MED ORDER — DOLUTEGRAVIR SODIUM 50 MG PO TABS
50.0000 mg | ORAL_TABLET | Freq: Every day | ORAL | Status: DC
  Administered 2017-11-06 – 2017-11-13 (×7): 50 mg via ORAL
  Filled 2017-11-05 (×11): qty 1

## 2017-11-05 MED ORDER — BICTEGRAVIR-EMTRICITAB-TENOFOV 50-200-25 MG PO TABS: 1.0000 | ORAL_TABLET | Freq: Every day | ORAL | Status: DC

## 2017-11-05 MED ORDER — EMTRICITABINE-TENOFOVIR DF 200-300 MG PO TABS
1.0000 | ORAL_TABLET | Freq: Every day | ORAL | Status: DC
  Administered 2017-11-06 – 2017-11-11 (×6): 1 via ORAL
  Filled 2017-11-05 (×10): qty 1

## 2017-11-05 MED ORDER — SODIUM CHLORIDE 0.9 % IV BOLUS (SEPSIS)
500.0000 mL | Freq: Once | INTRAVENOUS | Status: AC
  Administered 2017-11-05: 500 mL via INTRAVENOUS

## 2017-11-05 MED ORDER — ONDANSETRON HCL 4 MG/2ML IJ SOLN
4.0000 mg | INTRAMUSCULAR | Status: DC | PRN
  Administered 2017-11-05 – 2017-11-09 (×9): 4 mg via INTRAVENOUS
  Filled 2017-11-05 (×9): qty 2

## 2017-11-05 MED ORDER — DEXTROSE IN LACTATED RINGERS 5 % IV SOLN
INTRAVENOUS | Status: DC
Start: 2017-11-05 — End: 2017-11-08
  Administered 2017-11-05 – 2017-11-07 (×6): via INTRAVENOUS

## 2017-11-05 NOTE — Progress Notes (Signed)
PROGRESS NOTE    Anthony Yang  QMV:[id number] DOB: 05-29-1986 DOA: 10/03/2017 PCP: Patient, No Pcp Per    Brief Narrative:  31 year old male who presented with generalized weakness. She does have the significant medical history of HIV/AIDS, (CD4 count 28). He was referred from the ID clinic due to persistent diarrhea and dehydration, patient has lost 50 pounds over last 6 months, recently treated for Giardia. On his initial physical examination, blood pressure 93/68, heart rate 119, temperature 100.1, respiratory 13, oxygen saturation 100%. Dry mucous membranes, lungs clear to auscultation bilaterally, heart S1-S2 present rhythmic, tachycardic, the abdomen was nontender, nondistended, no lower extremity edema. Sodium 124, potassium 3.0, chloride 0.1, bicarbonate 15, glucose 142, BUN 28, creatinine 1.24, , magnesium 1.9, white count 12.0, hemoglobin 9.0, hematocrit 25.9, platelets 146. Urinalysis negative for infection. Chest x-ray negative for infiltrates. EKG sinus tachycardia, 115 beats per minute, normal axis, normal intervals.  Patient was admitted to the hospital working diagnosis of hyponatremia/hypokalemia due to dehydration due to acute on chronic diarrhea, in the setting of immunosuppression.   Assessment & Plan:   Principal Problem:   Chronic diarrhea Active Problems:   AIDS (acquired immune deficiency syndrome) (HCC)   Non-compliance   Protein-calorie malnutrition, severe   Dehydration   Thrombocytopenia (HCC)   Anemia in other chronic diseases classified elsewhere   AIDS-associated secretory diarrhea (HCC)  1. Hypovolemia due to significant GI losses. Will start patient on IV fluids at 100 ml per hour and will give 500 cc bolus, due to persistent resting tachycardia and significant ongoing fluid loss. Monitor urine output and renal function.   1. Hyponatremia/hypokalemia. Target K at 4 and Mg at 2, will add kcl per tube 40 meq x2 and Mg 2 grams IV, will continue maintenance  IV fluids with balanced electrolyte solution.   2. Acute or chronic diarrhea. No infectious agent identified, seems secretory diarrhea, continue supportive therapy. Noted significant vomiting, up to 1 liter through the course of the morning, will hold tube feeds for now. Questionable malabsorption. Add zofran for nausea.   3. HIV-AIDS. Continue antiretroviral therapy, patient with very low dc4 count. High risk for oppurtunistic infections.   4. Multifactorial anemia. No current indication for prbc transfusion, will continue h&h monitoring.    DVT prophylaxis: enoxaparin  Code Status: full Family Communication: I spoke with care giver at the bedside Disposition Plan: home/ dnf   Consultants:   ID  Gastroenterology  Procedures:     Antimicrobials:       Subjective: Patient with decreased nausea, improved diarrhea with more formed stools, about 3 over last 24 hours, tolerating well tube feedings and having some po intake. No chest pain or dyspnea, positive weakness and fatigue.   Per nursing, patient with significant diarrhea, and vomiting, watery and profuse.   Objective: Vitals:   11/04/17 2120 11/05/17 0008 11/05/17 0400 11/05/17 0800  BP: (!) 108/59 (!) 118/59  127/62  Pulse: (!) 137 (!) 137  (!) 121  Resp: (!) 23 (!) 26  19  Temp: 99.8 F (37.7 C) 97.8 F (36.6 C) 99.8 F (37.7 C)   TempSrc: Oral Oral Oral   SpO2: 98% 96%  99%  Weight:   60.8 kg (134 lb)   Height:        Intake/Output Summary (Last 24 hours) at 11/05/2017 0953 Last data filed at 11/05/2017 0830 Gross per 24 hour  Intake 877.67 ml  Output 1225 ml  Net -347.33 ml   Filed Weights   10/31/17 0402  11/04/17 0417 11/05/17 0400  Weight: 64.9 kg (143 lb) 63.8 kg (140 lb 9.6 oz) 60.8 kg (134 lb)    Examination:   General: deconditioned and ill looking appearing Neurology: Awake and alert, non focal  E ENT: positive pallor, no icterus, oral mucosa dry Cardiovascular: No JVD. S1-S2 tachycardic  present, rhythmic, no gallops, rubs, or murmurs. Positive + pitting lower extremity edema, at the ankles.  Pulmonary: vesicular breath sounds bilaterally, adequate air movement, no wheezing, rhonchi or rales. Decreased inspiratory effort, with decreased breath sounds at bases.  Gastrointestinal. Abdomen flat, no organomegaly, non tender, no rebound or guarding Skin. No rashes Musculoskeletal: no joint deformities     Data Reviewed: I have personally reviewed following labs and imaging studies  CBC: Recent Labs  Lab 10/30/17 0203 11/01/17 0326 11/01/17 2117 11/02/17 0152 11/04/17 1934 11/05/17 0359  WBC 10.7* 7.5  --  8.0 3.4* 4.4  HGB 7.7* 5.8* 8.6* 8.7* 6.4* 8.5*  HCT 22.3* 17.0* 24.3* 24.9* 18.5* 24.3*  MCV 78.5 79.1  --  82.2 83.7 83.8  PLT 79* 67*  --  59* 32* 29*   Basic Metabolic Panel: Recent Labs  Lab 10/30/17 0203 10/31/17 0306 11/01/17 0326 11/02/17 0152 11/03/17 0408 11/04/17 0350 11/05/17 0359  NA 131* 133* 134* 134*  --  133* 133*  K 3.5 3.5 3.4* 3.1*  --  3.8 3.3*  CL 114* 114* 114* 114*  --  110 110  CO2 13* 13* 14* 16*  --  20* 20*  GLUCOSE 136* 105* 100* 110*  --  115* 113*  BUN 26* 31* 30* 29*  --  23* 22*  CREATININE 0.85 0.87 0.94 0.83  --  0.71 0.70  CALCIUM 6.5* 6.5* 6.2* 6.3*  --  6.0* 5.5*  MG 1.6* 1.9 1.7 1.7 1.5* 1.7 1.8  PHOS 3.2 3.7 3.7 3.4  --   --  2.8   GFR: Estimated Creatinine Clearance: 115.1 mL/min (by C-G formula based on SCr of 0.7 mg/dL). Liver Function Tests: Recent Labs  Lab 11/01/17 0326 11/04/17 0850  AST 26 34  ALT 18 19  ALKPHOS 273* 162*  BILITOT 0.8 0.7  PROT 4.0* 4.0*  ALBUMIN <1.0* <1.0*   No results for input(s): LIPASE, AMYLASE in the last 168 hours. No results for input(s): AMMONIA in the last 168 hours. Coagulation Profile: No results for input(s): INR, PROTIME in the last 168 hours. Cardiac Enzymes: No results for input(s): CKTOTAL, CKMB, CKMBINDEX, TROPONINI in the last 168 hours. BNP (last 3  results) No results for input(s): PROBNP in the last 8760 hours. HbA1C: No results for input(s): HGBA1C in the last 72 hours. CBG: Recent Labs  Lab 11/04/17 1636 11/04/17 2014 11/04/17 2342 11/05/17 0429 11/05/17 0830  GLUCAP 174* 128* 120* 115* 117*   Lipid Profile: No results for input(s): CHOL, HDL, LDLCALC, TRIG, CHOLHDL, LDLDIRECT in the last 72 hours. Thyroid Function Tests: No results for input(s): TSH, T4TOTAL, FREET4, T3FREE, THYROIDAB in the last 72 hours. Anemia Panel: No results for input(s): VITAMINB12, FOLATE, FERRITIN, TIBC, IRON, RETICCTPCT in the last 72 hours.    Radiology Studies: I have reviewed all of the imaging during this hospital visit personally     Scheduled Meds: . albendazole  400 mg Per Tube BID WC  . azithromycin  1,200 mg Oral Weekly  . bictegravir-emtricitabine-tenofovir AF  1 tablet Oral Daily  . calcium carbonate  2 tablet Oral TID  . dronabinol  2.5 mg Oral BID AC  . megestrol  400  mg Oral Daily  . multivitamin  5 mL Oral Daily  . sodium chloride flush  10-40 mL Intracatheter Q12H  . sodium chloride flush  3 mL Intravenous Q12H  . sulfamethoxazole-trimethoprim  20 mL Oral Daily   Continuous Infusions: . sodium chloride 250 mL (11/05/17 0830)  . feeding supplement (VITAL 1.5 CAL) 1,000 mL (11/05/17 0500)     LOS: 13 days        Mauricio Annett Gula, MD [facility] Pager [phone]

## 2017-11-05 NOTE — Progress Notes (Signed)
Nutrition Follow Up  DOCUMENTATION CODES:   Severe malnutrition in context of chronic illness  INTERVENTION:    Recommend:  Vital 1.5 formula at 10 ml/hr via Cortrak feeding tube  Start TPN per pharmacy  NUTRITION DIAGNOSIS:   Severe Malnutrition related to chronic illness(AIDS) as evidenced by energy intake < 75% for > or equal to 1 month, percent weight loss, ongoing  GOAL:   Patient will meet greater than or equal to 90% of their needs, unmet  MONITOR:   TF tolerance, PO intake, Supplement acceptance, Labs, Weight trends, Skin  ASSESSMENT:   Anthony Yang  is a 31 y.o. male with past medical history relevant for HIV/AIDs (CD4 count was 28) [facility]  being off ART x 2 years with 2 month (originally diagnosed with HIV in 2013 when he was being treated for syphilis) who now presents to the ED from the HIV/infectious disease clinic with concerns about persistent diarrhea and dehydration. Patient has a history of about 50 pound weight loss over the last 6 months or so.  10/31 Cortrak tube placed   Vital 1.5 infusing at 40 ml/hr during RD visit earlier today.  Pt with nausea & vomiting since 0700. TF stopped per RN 1503. We have been trying to get pt to his goal rate of 60 ml/hr x 1 week.  Labs and medications reviewed. Na 133 (L). K 3.3 (L). Now on a Soft diet. No % PO intake recorded. CBG's [id number].  Diet Order:  DIET SOFT Room service appropriate? Yes; Fluid consistency: Thin  EDUCATION NEEDS:   No education needs have been identified at this time  Skin:  Skin Assessment: Skin Integrity Issues: Skin Integrity Issues:: Incisions Incisions: sacrum Other: open wound on coccyx and sacrum  Last BM:  11/7  Height:   Ht Readings from Last 1 Encounters:  10/28/2017 5\' 6"  (1.676 m)   Weight:   Wt Readings from Last 1 Encounters:  11/05/17 134 lb (60.8 kg)   Ideal Body Weight:  64.5 kg  BMI:  Body mass index is  21.63 kg/m.  Estimated Nutritional Needs:   Kcal:  1900-2100  Protein:  105-120 gm  Fluid:  1.9-2.1 L  Maureen ChattersKatie Carolanne Mercier, RD, LDN Pager #: [phone] After-Hours Pager #: [phone]

## 2017-11-05 NOTE — Progress Notes (Signed)
Patient with continued n/v since 7am this morning with >1200 mls of green/brown emesis. Dr. Ella JubileeArrien notified. Received orders to stop tube feeds.  Leanna BattlesEckelmann, Castella Lerner Eileen, RN

## 2017-11-05 NOTE — Plan of Care (Signed)
  No Outcome Acute Rehab PT Goals(only PT should resolve) Pt Will Go Supine/Side To Sit 11/05/2017 1250 by Karina Nofsinger, Eliseo GumKenneth V, PT Flowsheets Taken 11/05/2017 1250  Pt will go Supine/Side to Sit with minimal assist 11/05/2017 1249 by Jonet Mathies, Eliseo GumKenneth V, PT Flowsheets Taken 11/05/2017 1249  Pt will go Supine/Side to Sit with minimal assist Patient Will Perform Sitting Balance 11/05/2017 1250 by Kaedon Fanelli, Eliseo GumKenneth V, PT Flowsheets Taken 11/05/2017 1250  Patient will perform sitting balance with minimal assist;3- 5 min;with no UE support Patient Will Transfer Sit To/From Stand 11/05/2017 1250 by Sherlynn Tourville, Eliseo GumKenneth V, PT Flowsheets Taken 11/05/2017 1250  Patient will transfer sit to/from stand with moderate assist Pt Will Transfer Bed To Chair/Chair To Bed 11/05/2017 1250 by Tyshan Enderle, Eliseo GumKenneth V, PT Flowsheets Taken 11/05/2017 1250  Pt will Transfer Bed to Chair/Chair to Bed with min assist Pt Will Perform Standing Balance Or Pre-Gait 11/05/2017 1250 by Damiel Barthold, Eliseo GumKenneth V, PT Flowsheets Taken 11/05/2017 1250  Pt will perform standing balance or pre-gait  with moderate assist;with unilateral UE support

## 2017-11-05 NOTE — Progress Notes (Signed)
Visited with patient and his father. Had prayer with patient and father. Noticed patient was cold and asked if he would like a warm blanket and he said yes. Shared with his nurse before leaving the floor.  Phebe CollaDonna S Carlon Chaloux, Chaplain   11/05/17 1500  Clinical Encounter Type  Visited With Patient;Family  Visit Type Initial;Spiritual support  Referral From Chaplain  Consult/Referral To Chaplain  Spiritual Encounters  Spiritual Needs Prayer;Emotional  Stress Factors  Patient Stress Factors None identified  Family Stress Factors None identified

## 2017-11-05 NOTE — Progress Notes (Signed)
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 MAC excluded with negative BM Bx. On TF for nutritional support.       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 albendazole  400 mg Per Tube BID WC  . azithromycin  1,200 mg Oral Weekly  . bictegravir-emtricitabine-tenofovir AF  1 tablet Oral Daily  . calcium carbonate  2 tablet Oral TID  . dronabinol  2.5 mg Oral BID AC  . megestrol  400 mg Oral Daily  . multivitamin  5 mL Oral Daily  . potassium chloride  40 mEq Per NG tube Q4H  . sodium chloride flush  10-40 mL Intracatheter Q12H  . sodium chloride flush  3 mL Intravenous Q12H  . sulfamethoxazole-trimethoprim  20 mL Oral Daily    SUBJECTIVE: Weights likely not accurate and recorded in bed (117 - 143 lbs).   Review of Systems: Review of Systems  Constitutional: Positive for fever. Negative for chills.  HENT: Negative for tinnitus.   Eyes: Negative for blurred vision and photophobia.  Respiratory: Negative for cough and sputum production.   Cardiovascular: Negative for chest pain and palpitations.  Gastrointestinal: Negative for abdominal pain, diarrhea, nausea and vomiting.       Hiccups   Genitourinary: Negative for dysuria.  Skin: Negative for rash.  Neurological: Negative for headaches.  Psychiatric/Behavioral: Positive for depression.    Allergies  Allergen Reactions  . Penicillins Hives    Has patient had a PCN reaction causing immediate rash, facial/tongue/throat swelling, SOB or lightheadedness  with hypotension: Yes Has patient had a PCN reaction causing severe rash involving mucus membranes or skin necrosis: No Has patient had a PCN reaction that required hospitalization: No Has patient had a PCN reaction occurring within the last 10 years: No If all of the above answers are "NO", then may proceed with Cephalosporin use.    OBJECTIVE: Vitals:   11/04/17 2120 11/05/17 0008 11/05/17 0400 11/05/17 0800  BP: (!) 108/59 (!) 118/59  127/62  Pulse: (!) 137 (!) 137  (!) 121  Resp: (!) 23 (!) 26  19  Temp: 99.8 F (37.7 C) 97.8 F (36.6 C) 99.8 F (37.7 C)   TempSrc: Oral Oral Oral   SpO2: 98% 96%  99%  Weight:   134 lb (60.8 kg)   Height:       Body mass index is 21.63 kg/m.  Physical Exam  Constitutional: He is oriented to person, place, and time. He appears malnourished. He appears cachectic.  Eyes: No scleral icterus.  Cardiovascular: Regular rhythm. Tachycardia present.  No murmur heard. ?S3 vs S4 auscultated at apex  Pulmonary/Chest: Effort normal and breath sounds normal.  Abdominal: Soft. Bowel sounds are normal. He exhibits no distension. There is no tenderness.  Lymphadenopathy:    He has no cervical adenopathy.  Neurological: He is alert and oriented to person, place, and time.  Skin: Skin is warm and dry.  Psychiatric: Affect normal.    Lab Results Lab Results  Component Value Date   WBC 4.4  11/05/2017   HGB 8.5 (L) 11/05/2017   HCT 24.3 (L) 11/05/2017   MCV 83.8 11/05/2017   PLT 29 (LL) 11/05/2017    Lab Results  Component Value Date   CREATININE 0.70 11/05/2017   BUN 22 (H) 11/05/2017   NA 133 (L) 11/05/2017   K 3.3 (L) 11/05/2017   CL 110 11/05/2017   CO2 20 (L) 11/05/2017    Lab Results  Component Value Date   ALT 19 11/04/2017   AST 34 11/04/2017   ALKPHOS 162 (H) 11/04/2017   BILITOT 0.7 11/04/2017     Microbiology: BCx 10/25 >> NG x 4d  BCx 10/28 >> pending Bone Marrow Cx 10/1 >> negative AFB CDiff Ag 10/25 >> neg GI PCR  Panel 10/25 >> negative CMV PCR >> ~35,000 Microsporidia Smear 10/31 >> pending   ASSESSMENT& PLAN:   HIV/AIDS: continue Biktarvy, Azithromycin and Bactrim. Adjusted Biktarvy admin time as he is getting it timed with calcium replacements.   Will change Biktarvy to Tivicay + Truvada to give crushed via tube for now until he can tolerate swallowing pills consistently.    Diarrhea: Only 1 recorded BM 11/5, none 11/6. Continue Albendazole. Will need to have this given at [facility] to assist with cost. He will need to be on this at minimum for 6 months after his CD4 > 200.     Malnutrition / FTT: We are very hopeful Mr. Isensee can overcome this with continued aggressive advancement from nutritional standpoint while we continue ART and albendazole. Happy to see he is near his goal for TF and greatly appreciate Dietician's assistance.    Tachycardia: S3 + S4 heard at apex? Has been persistently tachy and with SVT on EKG from this AM. D/W Dr. Cathlean Sauer and will repeat echo to check to see if any changes with EF/LVfunction.   Will D/C Megace as well since he is on Marinol and can cause some cardio-toxic side effects.    Janene Madeira, MSN, NP-C [facility] for Infectious [place] Pager: [phone] Cell: [phone]  11/05/2017  10:35 AM

## 2017-11-05 NOTE — Progress Notes (Signed)
CRITICAL VALUE ALERT  Critical Value:  Platelets 29 and Calcium 5.5  Date & Time Notied:  11/05/2017 at 0510  Provider Notified: Toniann FailKakrakandy, MD  Orders Received/Actions taken: No orders or actions given.

## 2017-11-05 NOTE — Evaluation (Signed)
Physical Therapy Evaluation Patient Details Name: Anthony Yang MRN: [id number] DOB: [birth year] Today's Date: 11/05/2017   History of Present Illness  Anthony Yang  is a 31 y.o. male with past medical history relevant for HIV/AIDs (CD4 count was 28) [facility] after  being off ART x 2 years with 2 month (originally diagnosed with HIV in 2013 when he was being treated for syphilis) who now presents to the ED from the HIV/infectious disease clinic with concerns about persistent diarrhea and dehydration.  Clinical Impression  Pt admitted with/for persistent diarrhea and dehydration.  Pt presently needing mod assist at this least for basic mobility.  Will likely use 2 person assist to cover safety issues..  Pt currently limited functionally due to the problems listed. ( See problems list.)   Pt will benefit from PT to maximize function and safety in order to get ready for next venue listed below.     Follow Up Recommendations SNF    Equipment Recommendations  Wheelchair (measurements PT);Wheelchair cushion (measurements PT)    Recommendations for Other Services       Precautions / Restrictions Precautions Precautions: Fall      Mobility  Bed Mobility Overal bed mobility: Needs Assistance Bed Mobility: Rolling;Sidelying to Sit;Sit to Sidelying Rolling: Min assist Sidelying to sit: Mod assist     Sit to sidelying: Mod assist General bed mobility comments: cues for technique/direction; significant truncal assist  Transfers Overall transfer level: Needs assistance Equipment used: 1 person hand held assist Transfers: Sit to/from Stand Sit to Stand: Mod assist         General transfer comment: face to face assist to stand  Ambulation/Gait             General Gait Details: NT  Stairs            Wheelchair Mobility    Modified Rankin (Stroke Patients Only)       Balance Overall balance assessment: Needs  assistance Sitting-balance support: Single extremity supported;No upper extremity supported Sitting balance-Leahy Scale: Fair Sitting balance - Comments: able to maintain sitting without UE assist and could kick out legs and maintain balance with 1 UE assist     Standing balance-Leahy Scale: Poor                               Pertinent Vitals/Pain Pain Assessment: Faces Faces Pain Scale: Hurts a little bit Pain Location: joint aches Pain Descriptors / Indicators: Aching;Grimacing Pain Intervention(s): Limited activity within patient's tolerance    Home Living Family/patient expects to be discharged to:: Private residence Living Arrangements: Parent Available Help at Discharge: Family;Available PRN/intermittently Type of Home: House Home Access: Stairs to enter Entrance Stairs-Rails: None Entrance Stairs-Number of Steps: 2 Home Layout: Two level;Bed/bath upstairs;1/2 bath on main level Home Equipment: Walker - 2 wheels      Prior Function           Comments: usually mobility in home without AD     Hand Dominance        Extremity/Trunk Assessment   Upper Extremity Assessment Upper Extremity Assessment: Generalized weakness    Lower Extremity Assessment Lower Extremity Assessment: Generalized weakness;RLE deficits/detail;LLE deficits/detail RLE Deficits / Details: grossly 4- LLE Deficits / Details: grossly 3+ to 4-/5. limited df, pf 3/5       Communication   Communication: No difficulties  Cognition Arousal/Alertness: Awake/alert Behavior During Therapy: WFL for  tasks assessed/performed Overall Cognitive Status: Within Functional Limits for tasks assessed                                        General Comments      Exercises     Assessment/Plan    PT Assessment Patient needs continued PT services  PT Problem List Decreased strength;Decreased activity tolerance;Decreased balance;Decreased mobility;Decreased knowledge of  use of DME       PT Treatment Interventions DME instruction;Gait training;Functional mobility training;Therapeutic activities;Balance training;Patient/family education    PT Goals (Current goals can be found in the Care Plan section)  Acute Rehab PT Goals Patient Stated Goal: more mobile PT Goal Formulation: With patient Time For Goal Achievement: 11/19/17 Potential to Achieve Goals: Good    Frequency Min 3X/week   Barriers to discharge        Co-evaluation               AM-PAC PT "6 Clicks" Daily Activity  Outcome Measure Difficulty turning over in bed (including adjusting bedclothes, sheets and blankets)?: Unable Difficulty moving from lying on back to sitting on the side of the bed? : Unable Difficulty sitting down on and standing up from a chair with arms (e.g., wheelchair, bedside commode, etc,.)?: Unable Help needed moving to and from a bed to chair (including a wheelchair)?: A Lot Help needed walking in hospital room?: A Lot Help needed climbing 3-5 steps with a railing? : A Lot 6 Click Score: 9    End of Session   Activity Tolerance: Patient tolerated treatment well Patient left: in bed;with call bell/phone within reach;with family/visitor present;with nursing/sitter in room Nurse Communication: Mobility status PT Visit Diagnosis: Muscle weakness (generalized) (M62.81);Adult, failure to thrive (R62.7)    Time: 1201-1232 PT Time Calculation (min) (ACUTE ONLY): 31 min   Charges:   PT Evaluation $PT Eval Moderate Complexity: 1 Mod PT Treatments $Therapeutic Activity: 8-22 mins   PT G Codes:        11/05/2017  [place] BingKen Josely Yang, PT [phone]  (pager)  Anthony Yang 11/05/2017, 12:44 PM

## 2017-11-06 ENCOUNTER — Other Ambulatory Visit (HOSPITAL_COMMUNITY): Payer: Self-pay

## 2017-11-06 ENCOUNTER — Inpatient Hospital Stay (HOSPITAL_COMMUNITY): Payer: Medicaid Other

## 2017-11-06 DIAGNOSIS — R112 Nausea with vomiting, unspecified: Secondary | ICD-10-CM

## 2017-11-06 DIAGNOSIS — K567 Ileus, unspecified: Secondary | ICD-10-CM

## 2017-11-06 LAB — BASIC METABOLIC PANEL
ANION GAP: 4 — AB (ref 5–15)
BUN: 20 mg/dL (ref 6–20)
CHLORIDE: 108 mmol/L (ref 101–111)
CO2: 22 mmol/L (ref 22–32)
Calcium: 6 mg/dL — CL (ref 8.9–10.3)
Creatinine, Ser: 0.74 mg/dL (ref 0.61–1.24)
Glucose, Bld: 101 mg/dL — ABNORMAL HIGH (ref 65–99)
POTASSIUM: 3.7 mmol/L (ref 3.5–5.1)
SODIUM: 134 mmol/L — AB (ref 135–145)

## 2017-11-06 LAB — MAGNESIUM: MAGNESIUM: 1.9 mg/dL (ref 1.7–2.4)

## 2017-11-06 LAB — GLUCOSE, CAPILLARY
GLUCOSE-CAPILLARY: 104 mg/dL — AB (ref 65–99)
GLUCOSE-CAPILLARY: 108 mg/dL — AB (ref 65–99)
GLUCOSE-CAPILLARY: 155 mg/dL — AB (ref 65–99)
GLUCOSE-CAPILLARY: 95 mg/dL (ref 65–99)
Glucose-Capillary: 109 mg/dL — ABNORMAL HIGH (ref 65–99)
Glucose-Capillary: 99 mg/dL (ref 65–99)
Glucose-Capillary: 88 mg/dL (ref 65–99)

## 2017-11-06 LAB — CALCIUM, IONIZED: Calcium, Ionized, Serum: 3.9 mg/dL — ABNORMAL LOW (ref 4.5–5.6)

## 2017-11-06 MED ORDER — LOPERAMIDE HCL 1 MG/5ML PO LIQD
4.0000 mg | Freq: Two times a day (BID) | ORAL | Status: DC | PRN
  Administered 2017-11-06 – 2017-11-10 (×2): 4 mg via ORAL
  Filled 2017-11-06 (×3): qty 20

## 2017-11-06 MED ORDER — SODIUM CHLORIDE 0.9 % IV SOLN
1.0000 g | Freq: Once | INTRAVENOUS | Status: AC
  Administered 2017-11-06: 1 g via INTRAVENOUS
  Filled 2017-11-06: qty 10

## 2017-11-06 MED ORDER — MAGNESIUM SULFATE 2 GM/50ML IV SOLN
2.0000 g | Freq: Once | INTRAVENOUS | Status: AC
  Administered 2017-11-06: 2 g via INTRAVENOUS
  Filled 2017-11-06: qty 50

## 2017-11-06 MED ORDER — IOPAMIDOL (ISOVUE-300) INJECTION 61%
INTRAVENOUS | Status: AC
  Administered 2017-11-06: 100 mL
  Filled 2017-11-06: qty 100

## 2017-11-06 MED ORDER — POTASSIUM CHLORIDE 10 MEQ/100ML IV SOLN
10.0000 meq | INTRAVENOUS | Status: AC
  Administered 2017-11-06 (×4): 10 meq via INTRAVENOUS
  Filled 2017-11-06 (×4): qty 100

## 2017-11-06 MED ORDER — GERHARDT'S BUTT CREAM
TOPICAL_CREAM | CUTANEOUS | Status: DC | PRN
  Filled 2017-11-06: qty 1

## 2017-11-06 NOTE — Progress Notes (Signed)
PROGRESS NOTE    Anthony Yang  ZOX:[id number] DOB: 29-May-1986 DOA: 10/25/2017 PCP: Patient, No Pcp Per    Brief Narrative:  31 year old male who presented with generalized weakness. She does have the significant medical history of HIV/AIDS, (CD4 count 28). He was referred from the ID clinic due to persistent diarrhea and dehydration, patient has lost 50 pounds over last 6 months, recently treated for Giardia. On his initial physical examination, blood pressure 93/68, heart rate 119, temperature 100.1, respiratory 13, oxygen saturation 100%. Dry mucous membranes, lungs clear to auscultation bilaterally, heart S1-S2 present rhythmic, tachycardic, the abdomen was nontender, nondistended, no lower extremity edema. Sodium 124, potassium 3.0, chloride 0.1, bicarbonate 15, glucose 142, BUN 28, creatinine 1.24, , magnesium 1.9, white count 12.0, hemoglobin 9.0, hematocrit 25.9, platelets 146. Urinalysis negative for infection. Chest x-ray negative for infiltrates. EKG sinus tachycardia, 115 beats per minute, normal axis, normal intervals.  Patient was admitted to the hospital working diagnosis of hyponatremia/hypokalemia due to dehydration due to acute on chronic diarrhea, in the setting of immunosuppression.    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 Persistent severe nausea to rule out ileus. Abdominal distention, will order CT abdomen and pelvis, suspecting ileus. Will connect ng to suction and continue IV antiemetics with zofran. Electrolyte correction. Will keep patient NPO for now.  1. Hypovolemia due to significant GI losses. Continue IV fluids at 100 ml per hour, patient with significant volume loss,  emesis documented 3,500 ml, will continue hydration with balanced electrolyte solutions.   1. Hyponatremia/hypokalemia. Will continue k repletion with kcl, Mg at 1,9, patient wil low albuminemia, with low measured serum ca, ionized calclim 3,9, will oreder 1 amp of calcium gluconate, target 4,5.   2. Acute or chronic diarrhea. Persistent diarrhea, has been tested negative for MAC in the past, clinical presentation, cw secretory diarrhea. Will continue nutritional supplementation.   3. HIV-AIDS. On antiretroviral therapy.   4. Multifactorial anemia. Will check hb and hct in am. No signs of bleeding or blood loss.   DVT prophylaxis: enoxaparin  Code Status: full Family Communication: I spoke with care giver at the bedside Disposition Plan: home/ dnf   Consultants:   ID  Gastroenterology  Procedures:     Antimicrobials:      Subjective: Patient has developed significant vomiting, bilious, watery, associated with abdominal distention, and abdominal pain, no dyspnea or chest pain. Has remained tachycardic. Persistent diarrhea.   Objective: Vitals:   11/05/17 1739 11/05/17 1929 11/06/17 0404 11/06/17 0800  BP: 118/67 1_0  Pulse: (!) 135 (!) 134 (!) 122 (!) 124  Resp: (!) 23 (!) 23 (!) 25 20  Temp:  99.6 F (37.6 C) 99.2 F (37.3 C)   TempSrc:  Oral Oral   SpO2: 100% 99% 96% 98%  Weight:   65.6 kg (144 lb 9.6 oz)   Height:        Intake/Output Summary (Last 24 hours) at 11/06/2017 1404 Last data filed at 11/06/2017 1045 Gross per 24 hour  Intake 2365.33 ml  Output 4000 ml  Net -1634.67 ml   Filed Weights   11/04/17 0417 11/05/17 0400 11/06/17 0404  Weight: 63.8 kg (140 lb 9.6 oz) 60.8 kg (134 lb) 65.6  kg (144 lb 9.6 oz)    Examination:   General: deconditioned and ill looking appearing Neurology: Awake and alert, non focal  E ENT: positve pallor, no icterus, oral mucosa moist Cardiovascular: No JVD. S1-S2 present, rhythmic, tachycardic  no gallops, rubs, or murmurs. No lower extremity edema. Pulmonary: decreased breath sounds bilaterally, adequate air movement, no wheezing, rhonchi or rales. Gastrointestinal. Abdomen distended and tender, decreased bowel sounds, no organomegaly, no guarding Skin. No rashes Musculoskeletal: no joint deformities     Data Reviewed: I have personally reviewed following labs and imaging studies  CBC: Recent Labs  Lab 11/01/17 0326 11/01/17 2117 11/02/17 0152 11/04/17 1934 11/05/17 0359  WBC 7.5  --  8.0 3.4* 4.4  HGB 5.8* 8.6* 8.7* 6.4* 8.5*  HCT 17.0* 24.3* 24.9* 18.5* 24.3*  MCV 79.1  --  82.2 83.7 83.8  PLT 67*  --  59* 32* 29*   Basic Metabolic Panel: Recent Labs  Lab 10/31/17 0306 11/01/17 0326 11/02/17 0152 11/03/17 0408 11/04/17 0350 11/05/17 0359 11/06/17 0532  NA 133* 134* 134*  --  133* 133* 134*  K 3.5 3.4* 3.1*  --  3.8 3.3* 3.7  CL 114* 114* 114*  --  110 110 108  CO2 13* 14* 16*  --  20* 20* 22  GLUCOSE 105* 100* 110*  --  115* 113* 101*  BUN 31* 30* 29*  --  23* 22* 20  CREATININE 0.87 0.94 0.83  --  0.71 0.70 0.74  CALCIUM 6.5* 6.2* 6.3*  --  6.0* 5.5* 6.0*  MG 1.9 1.7 1.7 1.5* 1.7 1.8 1.9  PHOS 3.7 3.7 3.4  --   --  2.8  --    GFR: Estimated Creatinine Clearance: 120.7 mL/min (by C-G formula based on SCr of 0.74 mg/dL). Liver Function Tests: Recent Labs  Lab 11/01/17 0326 11/04/17 0850  AST 26 34  ALT 18 19  ALKPHOS 273* 162*  BILITOT 0.8 0.7  PROT 4.0* 4.0*  ALBUMIN <1.0* <1.0*   No results for input(s): LIPASE, AMYLASE in the last 168 hours. No results for input(s): AMMONIA in the last 168 hours. Coagulation Profile: No results for input(s): INR, PROTIME in the last 168 hours. Cardiac Enzymes: No results for input(s): CKTOTAL, CKMB, CKMBINDEX, TROPONINI in the last 168 hours. BNP (last 3 results) No results for input(s): PROBNP in the last 8760 hours. HbA1C: No results for input(s): HGBA1C in the last 72 hours. CBG: Recent Labs    Lab 11/05/17 2030 11/06/17 0014 11/06/17 0024 11/06/17 0445 11/06/17 1117  GLUCAP 155* >600* 104* 108* 109*   Lipid Profile: No results for input(s): CHOL, HDL, LDLCALC, TRIG, CHOLHDL, LDLDIRECT in the last 72 hours. Thyroid Function Tests: No results for input(s): TSH, T4TOTAL, FREET4, T3FREE, THYROIDAB in the last 72 hours. Anemia Panel: No results for input(s): VITAMINB12, FOLATE, FERRITIN, TIBC, IRON, RETICCTPCT in the last 72 hours.    Radiology Studies: I have reviewed all of the imaging during this hospital visit personally     Scheduled Meds: . calcium carbonate  2 tablet Oral TID  . emtricitabine-tenofovir  1 tablet Oral Daily   And  . dolutegravir  50 mg Oral Daily  . dronabinol  2.5 mg Oral BID AC  . multivitamin  5 mL Oral Daily  . sodium chloride flush  10-40 mL Intracatheter Q12H  . sodium chloride flush  3 mL Intravenous Q12H  . sulfamethoxazole-trimethoprim  20 mL Oral Daily   Continuous Infusions: . sodium chloride 250 mL (11/05/17 0830)  .  dextrose 5% lactated ringers 100 mL/hr at 11/06/17 0741  . feeding supplement (VITAL 1.5 CAL) Stopped (11/05/17 1500)     LOS: 14 days        Mauricio Gerome Apley, MD [facility] Pager [phone]

## 2017-11-06 NOTE — Progress Notes (Signed)
Late Entry  Dr. Ella JubileeArrien placed order for NGT to low intermittent wall suction, but patient only currently has a cortrak which cannot be set up with suction. MD aware. Stated to hold off on placing NGT until results from CT scan were back and then MD would make decision.  Leanna BattlesEckelmann, Tionne Carelli Eileen, RN

## 2017-11-06 NOTE — Progress Notes (Signed)
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 MAC excluded with negative BM Bx. On TF for nutritional support.       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 azithromycin  1,200 mg Oral Weekly  . calcium carbonate  2 tablet Oral TID  . emtricitabine-tenofovir  1 tablet Oral Daily   And  . dolutegravir  50 mg Oral Daily  . dronabinol  2.5 mg Oral BID AC  . multivitamin  5 mL Oral Daily  . sodium chloride flush  10-40 mL Intracatheter Q12H  . sodium chloride flush  3 mL Intravenous Q12H  . sulfamethoxazole-trimethoprim  20 mL Oral Daily    SUBJECTIVE: Wants to eat. Having a large volume of emesis (3L recorded yesterday). TF has been on hold since yesterday afternoon. D/W his nurse and he seems to vomit shortly after any meds are given VT.   Review of Systems: Review of Systems  Constitutional: Negative for chills and fever.  HENT: Negative for tinnitus.   Eyes: Negative for blurred vision and photophobia.  Respiratory: Negative for cough and sputum production.   Cardiovascular: Negative for chest pain and palpitations.  Gastrointestinal: Positive for abdominal pain, diarrhea, nausea and vomiting.       Hiccups   Genitourinary: Negative for dysuria.  Skin: Negative for rash.  Neurological: Negative for headaches.  Psychiatric/Behavioral: Positive for depression.    Allergies  Allergen Reactions  . Penicillins Hives    Has patient had a PCN reaction causing immediate rash, facial/tongue/throat swelling,  SOB or lightheadedness with hypotension: Yes Has patient had a PCN reaction causing severe rash involving mucus membranes or skin necrosis: No Has patient had a PCN reaction that required hospitalization: No Has patient had a PCN reaction occurring within the last 10 years: No If all of the above answers are "NO", then may proceed with Cephalosporin use.    OBJECTIVE: Vitals:   11/05/17 1739 11/05/17 1929 11/06/17 0404 11/06/17 0800  BP: 118/67 1_0  Pulse: (!) 135 (!) 134 (!) 122 (!) 124  Resp: (!) 23 (!) 23 (!) 25 20  Temp:  99.6 F (37.6 C) 99.2 F (37.3 C)   TempSrc:  Oral Oral   SpO2: 100% 99% 96% 98%  Weight:   144 lb 9.6 oz (65.6 kg)   Height:       Body mass index is 23.34 kg/m.  Physical Exam  Constitutional: He is oriented to person, place, and time. He appears malnourished. He appears cachectic.  Eyes: No scleral icterus.  Cardiovascular: Regular rhythm. Tachycardia present.  No murmur heard. ?S3 vs S4 auscultated at apex  Pulmonary/Chest: Effort normal and breath sounds normal.  Abdominal: Soft. He exhibits distension. Bowel sounds are hyperactive. There is tenderness in the left upper quadrant and left lower quadrant.  Lymphadenopathy:    He has no cervical adenopathy.  Neurological: He is alert and oriented to person, place, and time.  Skin: Skin is warm and dry.  Psychiatric: Affect normal.    Lab Results Lab  Results  Component Value Date   WBC 4.4 11/05/2017   HGB 8.5 (L) 11/05/2017   HCT 24.3 (L) 11/05/2017   MCV 83.8 11/05/2017   PLT 29 (LL) 11/05/2017    Lab Results  Component Value Date   CREATININE 0.74 11/06/2017   BUN 20 11/06/2017   NA 134 (L) 11/06/2017   K 3.7 11/06/2017   CL 108 11/06/2017   CO2 22 11/06/2017    Lab Results  Component Value Date   ALT 19 11/04/2017   AST 34 11/04/2017   ALKPHOS 162 (H) 11/04/2017   BILITOT 0.7 11/04/2017     Microbiology: BCx 10/25 >> NG  BCx 10/28 >> NG Bone Marrow Cx 10/1  >> negative AFB CDiff Ag 10/25 >> neg GI PCR Panel 10/25 >> negative CMV PCR >> ~35,000 Microsporidia Smear 10/31 >> negative   ASSESSMENT& PLAN:  1. HIV/AIDS: CD4 30, VL 604,000 on 09/25/17   Continue Truvada + Tivicay via tube.   Continue Bactrim   D/C azithromycin   Will recheck CD4 count and VL with reflex to geno   2. Diarrhea: with 3 stools today so far. Microsporidia negative as well as all other infectious testing. Abdomen is distended and hyperactive BS. MAC bone marrow biopsy was negative recently.   D/C Albendazole.   3. Vomiting/Nausea: He was not vomiting this much prior to hospitalization - TF vs SBO vs New medication?   Would recommend FU imaging to assess for obstruction with his progressive vomiting.   Would recommend consideration of anti-motility agent to help with frequency of diarrhea   Wonder if Marinol is causing some of the nausea / vomiting issues (can be up to 10% in reviewing side effects). If FU imaging neg would consider stopping this to see if it helps.   4. Malnutrition / FTT: again unable to tolerate TF. Has been here 2 weeks now  I agree with Dietician recommendations of starting TNA - discussed with Dr. Cathlean Sauer and will consider.   He will need PICC line placement as he cannot have parenteral nutrition through midline catheter.   5. Tachycardia: S3 + S4 heard at apex? Has been persistently tachy and with SVT on EKG from this 11/8. May be r/t dehydration however with prolonged malnutrition concerned over heart function as well. Per nursing IVF boluses have had little to no impact on rate.   Echo to be done today   Appreciate increasing maintenance IVF in setting of large volume emesis/diarrhea.    Janene Madeira, MSN, NP-C [facility] for Infectious [place] Pager: [phone] Cell: [phone]  11/06/2017  11:50 AM

## 2017-11-07 DIAGNOSIS — R161 Splenomegaly, not elsewhere classified: Secondary | ICD-10-CM

## 2017-11-07 DIAGNOSIS — R188 Other ascites: Secondary | ICD-10-CM

## 2017-11-07 LAB — CBC WITH DIFFERENTIAL/PLATELET
BASOS ABS: 0 10*3/uL (ref 0.0–0.1)
BLASTS: 0 %
Band Neutrophils: 10 %
Basophils Relative: 0 %
EOS ABS: 0 10*3/uL (ref 0.0–0.7)
Eosinophils Relative: 1 %
HEMATOCRIT: 19.3 % — AB (ref 39.0–52.0)
HEMOGLOBIN: 6.7 g/dL — AB (ref 13.0–17.0)
LYMPHS PCT: 16 %
Lymphs Abs: 0.6 10*3/uL — ABNORMAL LOW (ref 0.7–4.0)
MCH: 29.8 pg (ref 26.0–34.0)
MCHC: 34.7 g/dL (ref 30.0–36.0)
MCV: 85.8 fL (ref 78.0–100.0)
MONOS PCT: 8 %
Metamyelocytes Relative: 0 %
Monocytes Absolute: 0.3 10*3/uL (ref 0.1–1.0)
Myelocytes: 0 %
NEUTROS PCT: 50 %
Neutro Abs: 2.2 10*3/uL (ref 1.7–7.7)
OTHER: 15 %
PROMYELOCYTES ABS: 0 %
Platelets: 34 10*3/uL — ABNORMAL LOW (ref 150–400)
RBC: 2.25 MIL/uL — AB (ref 4.22–5.81)
RDW: 19.9 % — ABNORMAL HIGH (ref 11.5–15.5)
WBC: 3.6 10*3/uL — AB (ref 4.0–10.5)
nRBC: 0 /100 WBC

## 2017-11-07 LAB — GLUCOSE, CAPILLARY
GLUCOSE-CAPILLARY: 100 mg/dL — AB (ref 65–99)
GLUCOSE-CAPILLARY: 98 mg/dL (ref 65–99)
GLUCOSE-CAPILLARY: 89 mg/dL (ref 65–99)
Glucose-Capillary: 88 mg/dL (ref 65–99)
Glucose-Capillary: 92 mg/dL (ref 65–99)

## 2017-11-07 LAB — T-HELPER CELLS (CD4) COUNT (NOT AT ARMC)
CD4 % Helper T Cell: 3 % — ABNORMAL LOW (ref 33–55)
CD4 T Cell Abs: 20 /uL — ABNORMAL LOW (ref 400–2700)

## 2017-11-07 LAB — MAGNESIUM: MAGNESIUM: 1.7 mg/dL (ref 1.7–2.4)

## 2017-11-07 LAB — PATHOLOGIST SMEAR REVIEW

## 2017-11-07 LAB — PREPARE RBC (CROSSMATCH)

## 2017-11-07 MED ORDER — MAGNESIUM SULFATE 2 GM/50ML IV SOLN
2.0000 g | Freq: Once | INTRAVENOUS | Status: AC
Start: 2017-11-07 — End: 2017-11-07
  Administered 2017-11-07: 2 g via INTRAVENOUS
  Filled 2017-11-07: qty 50

## 2017-11-07 MED ORDER — VITAL 1.5 CAL PO LIQD
1000.0000 mL | ORAL | Status: DC
  Administered 2017-11-07 – 2017-11-08 (×2): 1000 mL
  Filled 2017-11-07 (×2): qty 1000

## 2017-11-07 MED ORDER — SODIUM CHLORIDE 0.9 % IV SOLN
Freq: Once | INTRAVENOUS | Status: AC
  Administered 2017-11-07: 18:00:00 via INTRAVENOUS

## 2017-11-07 MED ORDER — SODIUM CHLORIDE 0.9% FLUSH
10.0000 mL | Freq: Two times a day (BID) | INTRAVENOUS | Status: DC
  Administered 2017-11-08 – 2017-11-14 (×9): 10 mL

## 2017-11-07 MED ORDER — PROCHLORPERAZINE EDISYLATE 5 MG/ML IJ SOLN
10.0000 mg | Freq: Four times a day (QID) | INTRAMUSCULAR | Status: DC | PRN
Start: 2017-11-07 — End: 2017-11-15
  Administered 2017-11-08: 10 mg via INTRAVENOUS
  Filled 2017-11-07 (×2): qty 2

## 2017-11-07 MED ORDER — SODIUM CHLORIDE 0.9% FLUSH
10.0000 mL | INTRAVENOUS | Status: DC | PRN
  Administered 2017-11-11: 30 mL
  Filled 2017-11-07: qty 40

## 2017-11-07 NOTE — Progress Notes (Signed)
PT Cancellation Note  Patient Details Name: Verlan FriendsMarcus L Coger MRN: [id number] DOB: 12/01/1986   Cancelled Treatment:    Reason Eval/Treat Not Completed: (P) Patient not medically ready(Hemoglobin low at 6.7; will come back in afternoon after blood transfusion)   Sharlene Mottsachel Algernon Mundie, SPTA   Sharlene Mottsachel Stacee Earp 11/07/2017, 9:34 AM

## 2017-11-07 NOTE — Progress Notes (Signed)
CRITICAL VALUE ALERT  Critical Value:  Hemoglobin 6.7  Date & Time Notied:  11/07/17 8:30am  Provider Notified: Ella JubileeArrien, MD  Orders Received/Actions taken: Transfuse 2 PRBC

## 2017-11-07 NOTE — Progress Notes (Signed)
Peripherally Inserted Central Catheter/Midline Placement  The IV Nurse has discussed with the patient and/or persons authorized to consent for the patient, the purpose of this procedure and the potential benefits and risks involved with this procedure.  The benefits include less needle sticks, lab draws from the catheter, and the patient may be discharged home with the catheter. Risks include, but not limited to, infection, bleeding, blood clot (thrombus formation), and puncture of an artery; nerve damage and irregular heartbeat and possibility to perform a PICC exchange if needed/ordered by physician.  Alternatives to this procedure were also discussed.  Bard Power PICC patient education guide, fact sheet on infection prevention and patient information card has been provided to patient /or left at bedside.    PICC/Midline Placement Documentation  PICC Double Lumen 11/07/17 PICC Right Brachial 37 cm 0 cm (Active)  Indication for Insertion or Continuance of Line Administration of hyperosmolar/irritating solutions (i.e. TPN, Vancomycin, etc.) 11/07/2017  5:00 PM  Exposed Catheter (cm) 0 cm 11/07/2017  5:00 PM  Site Assessment Clean;Dry;Intact 11/07/2017  5:00 PM  Lumen #1 Status Flushed;Saline locked;Blood return noted 11/07/2017  5:00 PM  Lumen #2 Status Flushed;Saline locked;Blood return noted 11/07/2017  5:00 PM  Dressing Type Transparent;Securing device 11/07/2017  5:00 PM  Dressing Status Clean;Dry;Intact;Antimicrobial disc in place 11/07/2017  5:00 PM  Dressing Change Due 11/14/17 11/07/2017  5:00 PM       Romie Jumperlford, Xue Low Terry 11/07/2017, 5:29 PM

## 2017-11-07 NOTE — Progress Notes (Addendum)
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 MAC excluded with negative BM Bx. Attempting to continue TF for nutritional support however with high volume emesis this has been difficult for him.       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 emtricitabine-tenofovir  1 tablet Oral Daily   And  . dolutegravir  50 mg Oral Daily  . multivitamin  5 mL Oral Daily  . sodium chloride flush  10-40 mL Intracatheter Q12H  . sodium chloride flush  3 mL Intravenous Q12H  . sulfamethoxazole-trimethoprim  20 mL Oral Daily    SUBJECTIVE: Getting a bath currently. Vomiting now for the first time since yesterday--> thought to be provoked by moving around in bed. One BM today which he was incontinent of and loose.   Review of Systems: Review of Systems  Constitutional: Negative for chills and fever.  HENT: Negative for tinnitus.   Eyes: Negative for blurred vision and photophobia.  Respiratory: Negative for cough and sputum production.   Cardiovascular: Negative for chest pain and palpitations.  Gastrointestinal: Positive for abdominal pain, diarrhea, nausea and vomiting.       Hiccups   Genitourinary: Negative for dysuria.  Skin: Negative for rash.  Neurological: Negative for headaches.  Psychiatric/Behavioral: Positive for depression.    Allergies  Allergen Reactions  . Penicillins Hives    Has patient had a PCN reaction causing immediate rash, facial/tongue/throat swelling, SOB or lightheadedness with hypotension:  Yes Has patient had a PCN reaction causing severe rash involving mucus membranes or skin necrosis: No Has patient had a PCN reaction that required hospitalization: No Has patient had a PCN reaction occurring within the last 10 years: No If all of the above answers are "NO", then may proceed with Cephalosporin use.    OBJECTIVE: Vitals:   11/06/17 1516 11/06/17 1600 11/06/17 1940 11/07/17 0358  BP:  102/72 104/81 (!) 101/52  Pulse: (!) 129 (!) 133 (!) 118 (!) 120  Resp: 19 17 (!) 21 19  Temp:   100 F (37.8 C) 100.3 F (37.9 C)  TempSrc: Oral  Axillary Axillary  SpO2: 100% 100% 99% 97%  Weight:    138 lb (62.6 kg)  Height:       Body mass index is 22.27 kg/m.  Physical Exam  Constitutional: He is oriented to person, place, and time. He appears malnourished. He appears cachectic.  Eyes: No scleral icterus.  Cardiovascular: Regular rhythm. Tachycardia present.  No murmur heard. ?S3 vs S4 auscultated at apex  Pulmonary/Chest: Effort normal and breath sounds normal.  Abdominal: Soft. He exhibits distension. Bowel sounds are hyperactive. There is tenderness in the left upper quadrant and left lower quadrant.  Lymphadenopathy:    He has no cervical adenopathy.  Neurological: He is alert and oriented to person, place, and time.  Skin: Skin is warm and dry.  Psychiatric: Affect normal.    Lab Results Lab Results  Component Value Date   WBC 3.6 (L) 11/07/2017   HGB 6.7 (LL) 11/07/2017   HCT  19.3 (L) 11/07/2017   MCV 85.8 11/07/2017   PLT 34 (L) 11/07/2017    Lab Results  Component Value Date   CREATININE 0.80 11/07/2017   BUN 21 (H) 11/07/2017   NA 133 (L) 11/07/2017   K 3.9 11/07/2017   CL 109 11/07/2017   CO2 20 (L) 11/07/2017    Lab Results  Component Value Date   ALT 19 11/04/2017   AST 34 11/04/2017   ALKPHOS 162 (H) 11/04/2017   BILITOT 0.7 11/04/2017     Microbiology: BCx 10/25 >> NG  BCx 10/28 >> NG Bone Marrow Cx 10/1 >> negative AFB CDiff Ag 10/25  >> neg GI PCR Panel 10/25 >> negative CMV PCR >> ~35,000 Microsporidia Smear 10/31 >> negative    ASSESSMENT& PLAN:  1. HIV/AIDS: CD4 30, VL 604,000 on 09/25/17   Continue Truvada + Tivicay via tube.   Continue Bactrim   Repeat CD4 count and VL with reflex to geno pending  2. Diarrhea: 1 BM overnight. Microsporidia negative as well as all other infectious testing. MAC bone marrow biopsy was negative recently.   Reviewing previous labs CMV PCR in blood + with LV 35,000. Patient declined colonoscopy for biopsy to confirm and GI felt too weak and did not appear c/w CMV colitis --> will discuss with Dr. Linus Salmons regarding consideration of empiric valganciclovir.   3. Vomiting/Nausea: He was not vomiting this much prior to hospitalization. FU CT scan with enlarged spleen, thickened bowel d/t ascites from malnutrition; no mechanical obstruction suspected. 1 L emesis yesterday.   Uncertain as to the cause of this aside from ascites from malnutrition   4. Malnutrition / FTT: again unable to tolerate TF. Has been here 2 weeks now and making little to no progress.   I agree with Dietician recommendations of starting TNA - he has had inconsistent PO intake and has not tolerated TF well.  Primary team resuming TF at trickle dose today. TNA still pending per primary team for now - If agreeable to start he will need PICC line placement as he cannot have parenteral nutrition through current midline catheter.   5. Tachycardia: Heart normal size on CT scan. Has been persistently tachy in 120s. Likely r/t dehydration and anemia. No urine out put recorded for yesterday but he reports to be voiding well.    Janene Madeira, MSN, NP-C [facility] [place] Pager: [phone] Cell: [phone]  11/07/2017  9:52 AM    Patient was seen, examined,treatment plan was discussed with the  Advance Practice Provider.  I have personally reviewed the clinical  findings, lab, imaging studies and management of this patient in detail.I have also reviewed the orders written for this patient which were under my direction. I agree with the documentation, as recorded by the Advance Practice Provider.   I discussed with Dr. Cathlean Sauer and at this point I do feel he should start TPN since he has had so much trouble with tube feeds.    Dr. Megan Salon is available over the weekend if needed, otherwise we will follow up on Monday.

## 2017-11-07 NOTE — Progress Notes (Deleted)
Peripherally Inserted Central Catheter/Midline Placement  The IV Nurse has discussed with the patient and/or persons authorized to consent for the patient, the purpose of this procedure and the potential benefits and risks involved with this procedure.  The benefits include less needle sticks, lab draws from the catheter, and the patient may be discharged home with the catheter. Risks include, but not limited to, infection, bleeding, blood clot (thrombus formation), and puncture of an artery; nerve damage and irregular heartbeat and possibility to perform a PICC exchange if needed/ordered by physician.  Alternatives to this procedure were also discussed.  Bard Power PICC patient education guide, fact sheet on infection prevention and patient information card has been provided to patient /or left at bedside.    PICC/Midline Placement Documentation  PICC Double Lumen 11/07/17 PICC Right Brachial 37 cm 0 cm (Active)       Anthony Yang, [name] 11/07/2017, 5:27 PM

## 2017-11-07 NOTE — Progress Notes (Signed)
PROGRESS NOTE    Anthony Yang  IRJ:[id number] DOB: 16-May-1986 DOA: 10/02/2017 PCP: Patient, No Pcp Per    Brief Narrative:  31 year old male who presented with generalized weakness. She does have the significant medical history of HIV/AIDS,(CD4 count 28).He was referred from the ID clinic due to persistent diarrhea and dehydration, patient has lost 50 pounds over last 6 months, recently treated for Giardia.On his initial physical examination, blood pressure 93/68,heart rate 119, temperature 100.1, respiratory 13, oxygen saturation 100%.Dry mucous membranes, lungs clear to auscultation bilaterally, heart S1-S2 present rhythmic, tachycardic, the abdomen was nontender, nondistended, no lower extremity edema. Sodium 124, potassium 3.0, chloride 0.1, bicarbonate 15, glucose 142, BUN 28, creatinine 1.24,, magnesium 1.9,white count 12.0, hemoglobin 9.0, hematocrit 25.9, platelets 146.Urinalysis negative for infection. Chest x-ray negative for infiltrates. EKG sinus tachycardia, 115 beats per minute, normal axis, normal intervals.  Patient was admitted to the hospital working diagnosis of hyponatremia/hypokalemia due to dehydration due to acute on chronic diarrhea,in the setting of immunosuppression.    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 Hypovolemia due to significant GI losses. CT with no ileus or obstruction, will continue supportive IV fluids at 100 ml per hour, reported decrease diarrhea and no further vomiting, will resume tube feedings at 10 ml per hour, to prevent osmolar injury to the gastrointestinal mucosa. Case discussed with Dr. Linus Salmons, will plan for TPN for additional nutrition due to  prolonged calorie loss. Fluid balance continue to be positive 10, 000 ml.  2. Acute on chronic anemia plus thrombocytopenia. No signs of bleeding, likely marrow insufficiency related to acute and prolonged illness. Plt at 34,000. Will transfuse 2 units prbc to order to improve perfusion, (colloid solution), will hold on platelet transfusion for now, until less than 10,000. Continue close monitoring.   1.Hyponatremia/hypokalemia, with non gap metabolic alckalosis. K at 3,9 and mg at 1,7, will replete Mg today, I calcium 3,9 yesterday. Renal function preserved per cr, at 0.80. Will start on Bicitra per tube due to non gap metabolic acidosis.   2.Acute or chronic diarrhea. Liley combined secretory and osmotic diarrhea, no infection identified, MAC has been ruled out in the past, will continue supportive medical therapy. Today vomiting and diarrhea seemed to improve.  3.HIV-AIDS. On antiretroviral therapy, per ID recommendations.    DVT prophylaxis:enoxaparin Code Status:full Family Communication:I spoke with care giver at the bedside Disposition Plan:home/ dnf   Consultants:  ID  Gastroenterology  Procedures:    Antimicrobials  Subjective: Reported no further nausea and vomiting since last night, only one bowel movement last night, soft. No abdominal pain, no dyspnea or chest pain. Positive lower extremity edema ++ pitting.   Objective: Vitals:   11/06/17 1516 11/06/17 1600 11/06/17 1940 11/07/17 0358  BP:  102/72 104/81 (!) 101/52  Pulse: (!) 129 (!) 133 (!) 118 (!) 120  Resp: 19 17 (!) 21 19  Temp:   100 F (37.8 C) 100.3 F (37.9 C)  TempSrc: Oral  Axillary Axillary  SpO2: 100% 100% 99% 97%  Weight:    62.6 kg (138 lb)  Height:        Intake/Output Summary (Last 24 hours) at 11/07/2017 0936 Last data filed at 11/07/2017 0528 Gross per 24 hour  Intake  2656.84 ml  Output -  Net 2656.84 ml   Filed Weights   11/05/17 0400 11/06/17 0404 11/07/17 0358    Weight: 60.8 kg (134 lb) 65.6 kg (144 lb 9.6 oz) 62.6 kg (138 lb)    Examination:   General: deconditioned and ill looking appearing.  Neurology: Awake and alert, non focal  E ENT: mild pallor, no icterus, oral mucosa dry Cardiovascular: No JVD. S1-S2 present, rhythmic, tachycardic, no gallops, rubs, or murmurs. No lower extremity edema. Pulmonary: decreased breath sounds bilaterally, decreases air movement, no wheezing, rhonchi or rales. Gastrointestinal. Abdomen flat, no organomegaly, no rebound or guarding. Distention has improved, decreases bowel sounds.  Skin. No rashes Musculoskeletal: no joint deformities     Data Reviewed: I have personally reviewed following labs and imaging studies  CBC: Recent Labs  Lab 11/01/17 0326 11/01/17 2117 11/02/17 0152 11/04/17 1934 11/05/17 0359 11/07/17 0737  WBC 7.5  --  8.0 3.4* 4.4 3.6*  NEUTROABS  --   --   --   --   --  PENDING  HGB 5.8* 8.6* 8.7* 6.4* 8.5* 6.7*  HCT 17.0* 24.3* 24.9* 18.5* 24.3* 19.3*  MCV 79.1  --  82.2 83.7 83.8 85.8  PLT 67*  --  59* 32* 29* 34*   Basic Metabolic Panel: Recent Labs  Lab 11/01/17 0326 11/02/17 0152 11/03/17 0408 11/04/17 0350 11/05/17 0359 11/06/17 0532 11/07/17 0737  NA 134* 134*  --  133* 133* 134* 133*  K 3.4* 3.1*  --  3.8 3.3* 3.7 3.9  CL 114* 114*  --  110 110 108 109  CO2 14* 16*  --  20* 20* 22 20*  GLUCOSE 100* 110*  --  115* 113* 101* 97  BUN 30* 29*  --  23* 22* 20 21*  CREATININE 0.94 0.83  --  0.71 0.70 0.74 0.80  CALCIUM 6.2* 6.3*  --  6.0* 5.5* 6.0* 6.3*  MG 1.7 1.7 1.5* 1.7 1.8 1.9 1.7  PHOS 3.7 3.4  --   --  2.8  --   --    GFR: Estimated Creatinine Clearance: 118.5 mL/min (by C-G formula based on SCr of 0.8 mg/dL). Liver Function Tests: Recent Labs  Lab 11/01/17 0326 11/04/17 0850  AST 26 34  ALT 18 19  ALKPHOS 273* 162*  BILITOT 0.8 0.7  PROT 4.0* 4.0*  ALBUMIN <1.0* <1.0*   No results for input(s): LIPASE, AMYLASE in the last 168 hours. No  results for input(s): AMMONIA in the last 168 hours. Coagulation Profile: No results for input(s): INR, PROTIME in the last 168 hours. Cardiac Enzymes: No results for input(s): CKTOTAL, CKMB, CKMBINDEX, TROPONINI in the last 168 hours. BNP (last 3 results) No results for input(s): PROBNP in the last 8760 hours. HbA1C: No results for input(s): HGBA1C in the last 72 hours. CBG: Recent Labs  Lab 11/06/17 1616 11/06/17 1937 11/06/17 2350 11/07/17 0408 11/07/17 0824  GLUCAP 99 88 95 100* 98   Lipid Profile: No results for input(s): CHOL, HDL, LDLCALC, TRIG, CHOLHDL, LDLDIRECT in the last 72 hours. Thyroid Function Tests: No results for input(s): TSH, T4TOTAL, FREET4, T3FREE, THYROIDAB in the last 72 hours. Anemia Panel: No results for input(s): VITAMINB12, FOLATE, FERRITIN, TIBC, IRON, RETICCTPCT in the last 72 hours.    Radiology Studies: I have reviewed all of the imaging during this hospital visit personally     Scheduled Meds: . calcium carbonate  2 tablet Oral TID  . emtricitabine-tenofovir  1 tablet Oral Daily   And  .  dolutegravir  50 mg Oral Daily  . multivitamin  5 mL Oral Daily  . sodium chloride flush  10-40 mL Intracatheter Q12H  . sodium chloride flush  3 mL Intravenous Q12H  . sulfamethoxazole-trimethoprim  20 mL Oral Daily   Continuous Infusions: . sodium chloride 250 mL (11/05/17 0830)  . sodium chloride    . dextrose 5% lactated ringers 100 mL/hr at 11/07/17 0759  . feeding supplement (VITAL 1.5 CAL) Stopped (11/05/17 1500)     LOS: 15 days        Mauricio Gerome Apley, MD [facility] Pager [phone]

## 2017-11-07 NOTE — Progress Notes (Signed)
PT Cancellation Note  Patient Details Name: Verlan FriendsMarcus L Faraone MRN: [id number] DOB: 02-28-1986   Cancelled Treatment:    Reason Eval/Treat Not Completed: (P) Other (comment)(Nurse reports pt getting PICC line at this time will defer treatment today.   Forde RadonSybil Pearlee Arvizu, SPTA   Forde RadonSybil Perlie Scheuring 11/07/2017, 4:21 PM

## 2017-11-08 LAB — TYPE AND SCREEN
ABO/RH(D): B POS
Antibody Screen: NEGATIVE
Unit division: 0
Unit division: 0

## 2017-11-08 LAB — CBC WITH DIFFERENTIAL/PLATELET
BAND NEUTROPHILS: 0 %
BASOS ABS: 0 10*3/uL (ref 0.0–0.1)
BLASTS: 0 %
Basophils Relative: 0 %
Eosinophils Absolute: 0 10*3/uL (ref 0.0–0.7)
Eosinophils Relative: 0 %
HCT: 27 % — ABNORMAL LOW (ref 39.0–52.0)
Hemoglobin: 9.3 g/dL — ABNORMAL LOW (ref 13.0–17.0)
LYMPHS ABS: 3 10*3/uL (ref 0.7–4.0)
Lymphocytes Relative: 54 %
MCH: 28.7 pg (ref 26.0–34.0)
MCHC: 34.4 g/dL (ref 30.0–36.0)
MCV: 83.3 fL (ref 78.0–100.0)
METAMYELOCYTES PCT: 0 %
MONOS PCT: 7 %
MYELOCYTES: 0 %
Monocytes Absolute: 0.4 10*3/uL (ref 0.1–1.0)
Neutro Abs: 2.2 10*3/uL (ref 1.7–7.7)
Neutrophils Relative %: 39 %
PLATELETS: 34 10*3/uL — AB (ref 150–400)
PROMYELOCYTES ABS: 0 %
RBC: 3.24 MIL/uL — ABNORMAL LOW (ref 4.22–5.81)
RDW: 17.8 % — ABNORMAL HIGH (ref 11.5–15.5)
WBC: 5.6 10*3/uL (ref 4.0–10.5)
nRBC: 0 /100 WBC

## 2017-11-08 LAB — BPAM RBC
BLOOD PRODUCT EXPIRATION DATE: 201811272359
BLOOD PRODUCT EXPIRATION DATE: 201811272359
ISSUE DATE / TIME: 201811091717
ISSUE DATE / TIME: 201811092018
UNIT TYPE AND RH: 7300
Unit Type and Rh: 7300

## 2017-11-08 LAB — GLUCOSE, CAPILLARY
GLUCOSE-CAPILLARY: 127 mg/dL — AB (ref 65–99)
GLUCOSE-CAPILLARY: 87 mg/dL (ref 65–99)
GLUCOSE-CAPILLARY: 89 mg/dL (ref 65–99)
GLUCOSE-CAPILLARY: 90 mg/dL (ref 65–99)
Glucose-Capillary: 106 mg/dL — ABNORMAL HIGH (ref 65–99)
Glucose-Capillary: 95 mg/dL (ref 65–99)

## 2017-11-08 LAB — BASIC METABOLIC PANEL
Anion gap: 4 — ABNORMAL LOW (ref 5–15)
Anion gap: 3 — ABNORMAL LOW (ref 5–15)
BUN: 21 mg/dL — ABNORMAL HIGH (ref 6–20)
BUN: 20 mg/dL (ref 6–20)
CALCIUM: 6.1 mg/dL — AB (ref 8.9–10.3)
CO2: 20 mmol/L — AB (ref 22–32)
CO2: 22 mmol/L (ref 22–32)
CREATININE: 0.81 mg/dL (ref 0.61–1.24)
Calcium: 6.3 mg/dL — CL (ref 8.9–10.3)
Chloride: 109 mmol/L (ref 101–111)
Chloride: 109 mmol/L (ref 101–111)
Creatinine, Ser: 0.8 mg/dL (ref 0.61–1.24)
GFR calc Af Amer: >60 mL/min (ref >60)
GFR calc non Af Amer: >60 mL/min (ref >60)
GLUCOSE: 101 mg/dL — AB (ref 65–99)
Glucose, Bld: 97 mg/dL (ref 65–99)
POTASSIUM: 3.9 mmol/L (ref 3.5–5.1)
Potassium: 3.5 mmol/L (ref 3.5–5.1)
SODIUM: 133 mmol/L — AB (ref 135–145)
Sodium: 134 mmol/L — ABNORMAL LOW (ref 135–145)

## 2017-11-08 LAB — MAGNESIUM: Magnesium: 1.7 mg/dL (ref 1.7–2.4)

## 2017-11-08 MED ORDER — POTASSIUM CHLORIDE 20 MEQ PO PACK
40.0000 meq | PACK | ORAL | Status: AC
  Administered 2017-11-08 (×2): 40 meq via NASOGASTRIC
  Filled 2017-11-08 (×2): qty 2

## 2017-11-08 MED ORDER — TRAVASOL 10 % IV SOLN
INTRAVENOUS | Status: AC
  Administered 2017-11-08: 18:00:00 via INTRAVENOUS
  Filled 2017-11-08: qty 576

## 2017-11-08 MED ORDER — BACLOFEN 1 MG/ML ORAL SUSPENSION
5.0000 mg | Freq: Three times a day (TID) | ORAL | Status: DC
  Administered 2017-11-08 – 2017-11-09 (×4): 5 mg
  Filled 2017-11-08 (×5): qty 0.5

## 2017-11-08 MED ORDER — MAGNESIUM OXIDE 400 (241.3 MG) MG PO TABS
400.0000 mg | ORAL_TABLET | Freq: Three times a day (TID) | ORAL | Status: DC
  Administered 2017-11-08 – 2017-11-09 (×4): 400 mg via NASOGASTRIC
  Filled 2017-11-08 (×4): qty 1

## 2017-11-08 MED ORDER — BACLOFEN 1 MG/ML ORAL SUSPENSION: 5.0000 mg | Freq: Three times a day (TID) | ORAL | Status: DC

## 2017-11-08 MED ORDER — DEXTROSE IN LACTATED RINGERS 5 % IV SOLN
INTRAVENOUS | Status: DC
  Administered 2017-11-08: 18:00:00 via INTRAVENOUS

## 2017-11-08 MED ORDER — BACLOFEN 5 MG HALF TABLET
5.0000 mg | ORAL_TABLET | Freq: Three times a day (TID) | ORAL | Status: DC
  Filled 2017-11-08: qty 1

## 2017-11-08 NOTE — Plan of Care (Signed)
  Pain Managment: General experience of comfort will improve 11/08/2017 2221 - Not Progressing by Victorino DecemberMiller, Posey Petrik A, Student-RN   Patient continues to experience nausea, vomiting and diarrhea. Medication administered but baseline remains the same.   Activity: Risk for activity intolerance will decrease 11/08/2017 2221 - Not Progressing by Victorino DecemberMiller, Niguel Moure A, Student-RN    Patient is on bedrest. Continues to have generalized weakness.  Fluid Volume: Ability to maintain a balanced intake and output will improve 11/08/2017 2221 - Not Progressing by Victorino DecemberMiller, Nashira Mcglynn A, Student-RN   Patient is on tube feeds and TPN. Takes in small amounts of fluid by mouth. On a clear liquid diet. Continues to have bouts of vomiting.  Bowel/Gastric: Will not experience complications related to bowel motility 11/08/2017 2221 - Not Progressing by Victorino DecemberMiller, Lyanne Kates A, Student-RN   Patient continues to experience diarrhea several times a shift.

## 2017-11-08 NOTE — Progress Notes (Signed)
Nutrition Follow-up  DOCUMENTATION CODES:   Severe malnutrition in context of chronic illness  INTERVENTION:   -Continue trickle feedings of Vital 1.5 @ 10 ml/hr via cortrak tube -TPN management per pharmacy -RD will continue to follow for diet advancement -Recommend advancing TPN slowly and monitoring K, Mg, and Phos daily x 3 day, repleting as needed, as pt is at high refeeding risk  NUTRITION DIAGNOSIS:   Severe Malnutrition related to chronic illness(AIDS) as evidenced by energy intake < 75% for > or equal to 1 month, percent weight loss, moderate fat depletion, severe fat depletion, moderate muscle depletion, severe muscle depletion.  Ongoing  GOAL:   Patient will meet greater than or equal to 90% of their needs  Progressing  MONITOR:   PO intake, Diet advancement, Labs, Weight trends, TF tolerance, Skin, I & O's  REASON FOR ASSESSMENT:   Consult New TPN/TNA  ASSESSMENT:   Anderson MaltaMarcus Cajas  is a 31 y.o. male with past medical history relevant for HIV/AIDs (CD4 count was 28) [facility]  being off ART x 2 years with 2 month (originally diagnosed with HIV in 2013 when he was being treated for syphilis) who now presents to the ED from the HIV/infectious disease clinic with concerns about persistent diarrhea and dehydration. Patient has a history of about 50 pound weight loss over the last 6 months or so.  10/31- cortrak tube placed 11/9- PICC placed  Spoke with pt and father at bedside. Pt complains of ongoing abdominal pain. He is able to tolerate small bites of clear liquids- noted pt consumed about 1/3 of broth on tray. Father states that nausea and vomiting have appeared to improve today- noted pt regurgitated a small amount of translucent, orange tinted liquid during RD exam.   Vital 1.5 infusing @ 10 ml/hr via cortrak tube, providing 360 kcals, 16 grams protein, and 183 ml free water (meeting 18% of estimated kcal needs and  15% of estimated protein needs). Pt has been unable to reach goal rate of 60 ml/hr for > 1 week, related to vomiting and abdominal pain.   Per pharmacy note, plan to initiate TPN @ 40 ml/hr at 1800, which will provides 912 kcals and 58 grams of protein. Complete nutrition support regimen (TF +TPN) will provide 1272 kcals and 74 grams protein (64% of estimated kcal needs and 53% of estimated protein needs).   Discussed with pt and father plans to start TPN (in conjunction with TF and PO diet) to assist pt in meeting nutritional goals. Both expressed appreciation for RD involvement.   Labs reviewed: Na: 134, CBGS: 86-106.    NUTRITION - FOCUSED PHYSICAL EXAM:    Most Recent Value  Orbital Region  Severe depletion  Upper Arm Region  Moderate depletion  Thoracic and Lumbar Region  Severe depletion  Buccal Region  Severe depletion  Temple Region  Severe depletion  Clavicle Bone Region  Severe depletion  Clavicle and Acromion Bone Region  Severe depletion  Scapular Bone Region  Severe depletion  Dorsal Hand  Moderate depletion  Patellar Region  Moderate depletion  Anterior Thigh Region  Moderate depletion  Posterior Calf Region  Moderate depletion  Edema (RD Assessment)  Mild  Hair  Reviewed  Eyes  Reviewed  Mouth  Reviewed  Skin  Reviewed  Nails  Reviewed       Diet Order:  Diet clear liquid Room service appropriate? Yes; Fluid consistency: Thin TPN ADULT (ION)  EDUCATION NEEDS:  Education needs have been addressed  Skin:  Skin Assessment: Skin Integrity Issues: Skin Integrity Issues:: Incisions Incisions: sacrum Other: open wound on coccyx and sacrum  Last BM:  11/07/17  Height:   Ht Readings from Last 1 Encounters:  10/26/2017 5\' 6"  (1.676 m)    Weight:   Wt Readings from Last 1 Encounters:  11/08/17 139 lb 8 oz (63.3 kg)    Ideal Body Weight:  64.5 kg  BMI:  Body mass index is 22.52 kg/m.  Estimated Nutritional Needs:   Kcal:  2000-2200  Protein:   110-125 grams  Fluid:  > 2.0 L    Chasen Mendell A. Mayford KnifeWilliams, RD, LDN, CDE Pager: [phone] After hours Pager: [phone]

## 2017-11-08 NOTE — Progress Notes (Signed)
PROGRESS NOTE    Anthony Yang  ZOX:[id number] DOB: 11/17/1986 DOA: 11/04/2017 PCP: Patient, No Pcp Per    Brief Narrative:  31 year old male who presented with generalized weakness. She does have the significant medical history of HIV/AIDS,(CD4 count 28).He was referred from the ID clinic due to persistent diarrhea and dehydration, patient has lost 50 pounds over last 6 months, recently treated for Giardia.On his initial physical examination, blood pressure 93/68,heart rate 119, temperature 100.1, respiratory 13, oxygen saturation 100%.Dry mucous membranes, lungs clear to auscultation bilaterally, heart S1-S2 present rhythmic, tachycardic, the abdomen was nontender, nondistended, no lower extremity edema. Sodium 124, potassium 3.0, chloride 0.1, bicarbonate 15, glucose 142, BUN 28, creatinine 1.24,, magnesium 1.9,white count 12.0, hemoglobin 9.0, hematocrit 25.9, platelets 146.Urinalysis negative for infection. Chest x-ray negative for infiltrates. EKG sinus tachycardia, 115 beats per minute, normal axis, normal intervals.  Patient was admitted to the hospital working diagnosis of hyponatremia/hypokalemia due to dehydration due to acute on chronic diarrhea,in the setting of immunosuppression.    Assessment & Plan:   Principal Problem:   Chronic diarrhea Active Problems:   AIDS (acquired immune deficiency syndrome) (HCC)   Non-compliance   Protein-calorie malnutrition, severe   Dehydration   Thrombocytopenia (HCC)   Anemia in other chronic diseases classified elsewhere   AIDS-associated secretory diarrhea (HCC)  1. Hypovolemia due to significant GI losses.Volume loss has decreased, with less vomiting and less diarrhea, will continue IV fluids with d5 LR at 35 ml per hour, patient will be started on parenteral nutrition. Tolerated well 2 units prbc transfusion. Will continue enteral feedings per ng at slow rate and will allow patient to have clear liquids.    2. Acute on  chronic anemia plus thrombocytopenia. Stable platelet count at 34, hb and hct have improved to 9.3 and 27 with 2 units prbc, will continue close follow up on cell count.   1.Hyponatremia/hypokalemia/ hypocalcemia, with non gap metabolic alkalosis. Patient with significant hypoalbuminemia and hypoproteinemia, a serum calcium of 5.5  was equivalent to ionized calcium of 3,9 (11/07). Likely a serum calcium of 6 or above will be equivalent to a normal ionized calcium. Will continue to replete K with kcl and Mg with magnesium oxide. Follow renal panel plus ionized calcium in am.   2.Acute or chronic diarrhea.Clinically improving, will continue supportive medical therapy, slow rate of tube feedings, will start TPN (short term). No signs of infectious etiology.   3.HIV-AIDS.continue withantiretroviral therapy, per ID recommendations.    DVT prophylaxis:enoxaparin Code Status:full Family Communication:I spoke with care giver at the bedside Disposition Plan:home/ dnf   Consultants:  ID  Gastroenterology  Procedures:    Antimicrobials   Subjective: Patient is feeling better, tolerated well prbc transfusion, vomiting and diarrhea continue to improve. No abdominal pain. Very weak and deconditioned. Positive hiccups.    Objective: Vitals:   11/07/17 2327 11/08/17 0000 11/08/17 0400 11/08/17 0445  BP: 117/72 107/68 113/69   Pulse: (!) 109 (!) 115 (!) 107 (!) 107  Resp: (!) 24 19 (!) 22 19  Temp: 99 F (37.2 C)     TempSrc: Oral     SpO2: 97% 97% 94% 99%  Weight:    63.3 kg (139 lb 8 oz)  Height:        Intake/Output Summary (Last 24 hours) at 11/08/2017 0811 Last data filed at 11/08/2017 0409 Gross per 24 hour  Intake 603 ml  Output 853 ml  Net -250 ml   Filed Weights   11/06/17 0404 11/07/17 0358  11/08/17 0445  Weight: 65.6 kg (144 lb 9.6 oz) 62.6 kg (138 lb) 63.3 kg (139 lb 8 oz)    Examination:   General: deconditioned and ill looking  appearing Neurology: Awake and alert, non focal  E WGN:FAOZHYQMENT:positive pallor, no icterus, oral mucosa moist Cardiovascular: No JVD. S1-S2 present, rhythmic, no gallops, rubs, or murmurs. Trace pitting lower extremity edema. Pulmonary: vesicular breath sounds bilaterally, adequate air movement, no wheezing, rhonchi or rales. Mild decreased breath sounds at bases.  Gastrointestinal. Abdomen flat, no organomegaly, non tender, no rebound or guarding Skin. No rashes Musculoskeletal: no joint deformities     Data Reviewed: I have personally reviewed following labs and imaging studies  CBC: Recent Labs  Lab 11/02/17 0152 11/04/17 1934 11/05/17 0359 11/07/17 0737 11/08/17 0432  WBC 8.0 3.4* 4.4 3.6* 5.6  NEUTROABS  --   --   --  2.2 2.2  HGB 8.7* 6.4* 8.5* 6.7* 9.3*  HCT 24.9* 18.5* 24.3* 19.3* 27.0*  MCV 82.2 83.7 83.8 85.8 83.3  PLT 59* 32* 29* 34* 34*   Basic Metabolic Panel: Recent Labs  Lab 11/02/17 0152  11/04/17 0350 11/05/17 0359 11/06/17 0532 11/07/17 0737 11/08/17 0432  NA 134*  --  133* 133* 134* 133* 134*  K 3.1*  --  3.8 3.3* 3.7 3.9 3.5  CL 114*  --  110 110 108 109 109  CO2 16*  --  20* 20* 22 20* 22  GLUCOSE 110*  --  115* 113* 101* 97 101*  BUN 29*  --  23* 22* 20 21* 20  CREATININE 0.83  --  0.71 0.70 0.74 0.80 0.81  CALCIUM 6.3*  --  6.0* 5.5* 6.0* 6.3* 6.1*  MG 1.7   < > 1.7 1.8 1.9 1.7 1.7  PHOS 3.4  --   --  2.8  --   --   --    < > = values in this interval not displayed.   GFR: Estimated Creatinine Clearance: 118.3 mL/min (by C-G formula based on SCr of 0.81 mg/dL). Liver Function Tests: Recent Labs  Lab 11/04/17 0850  AST 34  ALT 19  ALKPHOS 162*  BILITOT 0.7  PROT 4.0*  ALBUMIN <1.0*   No results for input(s): LIPASE, AMYLASE in the last 168 hours. No results for input(s): AMMONIA in the last 168 hours. Coagulation Profile: No results for input(s): INR, PROTIME in the last 168 hours. Cardiac Enzymes: No results for input(s): CKTOTAL,  CKMB, CKMBINDEX, TROPONINI in the last 168 hours. BNP (last 3 results) No results for input(s): PROBNP in the last 8760 hours. HbA1C: No results for input(s): HGBA1C in the last 72 hours. CBG: Recent Labs  Lab 11/07/17 1134 11/07/17 1722 11/07/17 2009 11/08/17 0025 11/08/17 0354  GLUCAP 89 88 92 90 89   Lipid Profile: No results for input(s): CHOL, HDL, LDLCALC, TRIG, CHOLHDL, LDLDIRECT in the last 72 hours. Thyroid Function Tests: No results for input(s): TSH, T4TOTAL, FREET4, T3FREE, THYROIDAB in the last 72 hours. Anemia Panel: No results for input(s): VITAMINB12, FOLATE, FERRITIN, TIBC, IRON, RETICCTPCT in the last 72 hours.    Radiology Studies: I have reviewed all of the imaging during this hospital visit personally     Scheduled Meds: . emtricitabine-tenofovir  1 tablet Oral Daily   And  . dolutegravir  50 mg Oral Daily  . multivitamin  5 mL Oral Daily  . sodium chloride flush  10-40 mL Intracatheter Q12H  . sodium chloride flush  10-40 mL Intracatheter Q12H  .  sodium chloride flush  3 mL Intravenous Q12H  . sulfamethoxazole-trimethoprim  20 mL Oral Daily   Continuous Infusions: . sodium chloride 250 mL (11/05/17 0830)  . dextrose 5% lactated ringers 100 mL/hr at 11/07/17 2343  . feeding supplement (VITAL 1.5 CAL) 1,000 mL (11/07/17 1036)     LOS: 16 days        Anthony Annett Gulaaniel Arrien, MD [facility] Pager [phone]

## 2017-11-08 NOTE — Progress Notes (Signed)
PHARMACY - ADULT TOTAL PARENTERAL NUTRITION CONSULT NOTE   Pharmacy Consult for TPN Indication: prolonged NPO  Patient Measurements: Height: 5\' 6"  (167.6 cm) Weight: 139 lb 8 oz (63.3 kg) IBW/kg (Calculated) : 63.8 TPN AdjBW (KG): 53.1 Body mass index is 22.52 kg/m.  Assessment:  31 yo M presents on 10/25 with persistent diarrhea and dehydration. Has had recent ~50 lb weight loss over last 6 months. Hx of HV with low CD4 count. Had been off therapy but started on Triumeq in September 2018. Has continued to have excessive vomiting and nausea. Had been on TFs running at goal rate intil 11/4. Now unable to tolerate tube feeds past trickle rate for the last week. Has had poor PO intake since admit and is currently NPO. CT does not show an ileus or obstruction. PICC inserted yesterday. Pharmacy consulted to start TPN.  GI: Emesis down yesterday to 200ml. Last BM 11/9 Endo: No hx of DM. CBGs well controlled. No SSI Insulin requirements in the past 24 hours: 0 units Lytes: wnl, K borderline low at 3.5. CoCa ok. Mg borderline low at 1.7. Potassium replacement ordered Renal: SCr stable, CrCl > 17400ml/min. UOP not all charted. D5LR at 3875ml/hr Pulm: RA Cards: BP ok and HR tachy. Given transfusions yesterday Hepatobil: LFTs and Tbili ok.  Neuro: ID: Hx of HIV, continues on Truvada and dolutegravir. Afebrile, WBC wnl  Best Practices: SCDs TPN Access: PICC Double lumen TPN start date: 11/10 >>  Nutritional Goals (per RD recommendation on 11/7): KCal: 1900-2100 Protein: 105-120 Fluid: 1.9-2.1 L  Goal TPN: 1075ml/hr (Provides 108 g and 1710 kcal) Trickle feeds to provide rest of goal  Current Nutrition:  Clear liquid diet Vital 1.5 Cal at trickle feeds (16g of protein and 360 kcal)  Plan:  Start TPN at 40 ml/hr Continue Vital 1.5 Cal at 2410ml/hr per MD TPN provides 58 g of protein, 144 g of dextrose, and 19 g of lipids giving a total of 912 kcal which meets ~50% of patient needs. Trickle  feeds will add some nutrition. Will advance TPN to goal as tolerated Electrolytes in TPN: standard exc increase Mg, Cl:Ac 1:1 Decrease D5LR to 135ml/hr tonight D/c liquid MVI Add MVI and trace elements in TPN Hold off on SSI and add if needed. Adjust CBG checks to Q8h Monitor TPN labs F/U improvement in vomiting and nausea, ability to advance TFs  Anthony BiNathan Brentley Yang, PharmD, BCPS Clinical Pharmacist Pager [phone] 11/08/2017 9:12 AM

## 2017-11-08 NOTE — Progress Notes (Signed)
CRITICAL VALUE ALERT  Critical Value: calcium 6.1  Date & Time Notied:  11/08/2017; 05:47 Provider Notified: [place]  Orders Received/Actions taken: Awaiting orders.  06:38: No new order received at this time

## 2017-11-09 DIAGNOSIS — L8990 Pressure ulcer of unspecified site, unspecified stage: Secondary | ICD-10-CM | POA: Diagnosis present

## 2017-11-09 LAB — MAGNESIUM: Magnesium: 1.8 mg/dL (ref 1.7–2.4)

## 2017-11-09 LAB — COMPREHENSIVE METABOLIC PANEL
ALK PHOS: 226 U/L — AB (ref 38–126)
ALT: 26 U/L (ref 17–63)
ANION GAP: 4 — AB (ref 5–15)
AST: 123 U/L — AB (ref 15–41)
BUN: 19 mg/dL (ref 6–20)
CO2: 21 mmol/L — AB (ref 22–32)
Calcium: 6 mg/dL — CL (ref 8.9–10.3)
Chloride: 109 mmol/L (ref 101–111)
Creatinine, Ser: 0.79 mg/dL (ref 0.61–1.24)
GFR calc Af Amer: >60 mL/min (ref >60)
GFR calc non Af Amer: >60 mL/min (ref >60)
GLUCOSE: 130 mg/dL — AB (ref 65–99)
POTASSIUM: 3.4 mmol/L — AB (ref 3.5–5.1)
SODIUM: 134 mmol/L — AB (ref 135–145)
Total Bilirubin: 1.3 mg/dL — ABNORMAL HIGH (ref 0.3–1.2)
Total Protein: 3.7 g/dL — ABNORMAL LOW (ref 6.5–8.1)

## 2017-11-09 LAB — GLUCOSE, CAPILLARY
GLUCOSE-CAPILLARY: 130 mg/dL — AB (ref 65–99)
GLUCOSE-CAPILLARY: 135 mg/dL — AB (ref 65–99)
Glucose-Capillary: 144 mg/dL — ABNORMAL HIGH (ref 65–99)

## 2017-11-09 LAB — PHOSPHORUS: Phosphorus: 1.9 mg/dL — ABNORMAL LOW (ref 2.5–4.6)

## 2017-11-09 MED ORDER — MAGNESIUM OXIDE 400 (241.3 MG) MG PO TABS
400.0000 mg | ORAL_TABLET | Freq: Three times a day (TID) | ORAL | Status: DC
  Administered 2017-11-09 – 2017-11-13 (×8): 400 mg via ORAL
  Filled 2017-11-09 (×9): qty 1

## 2017-11-09 MED ORDER — POTASSIUM PHOSPHATES 15 MMOLE/5ML IV SOLN
20.0000 mmol | Freq: Once | INTRAVENOUS | Status: AC
  Administered 2017-11-09: 20 mmol via INTRAVENOUS
  Filled 2017-11-09: qty 6.67

## 2017-11-09 MED ORDER — INSULIN ASPART 100 UNIT/ML ~~LOC~~ SOLN
0.0000 [IU] | Freq: Three times a day (TID) | SUBCUTANEOUS | Status: DC
  Administered 2017-11-09: 2 [IU] via SUBCUTANEOUS
  Administered 2017-11-10 (×2): 1 [IU] via SUBCUTANEOUS

## 2017-11-09 MED ORDER — TRAVASOL 10 % IV SOLN
INTRAVENOUS | Status: AC
  Administered 2017-11-09: 18:00:00 via INTRAVENOUS
  Filled 2017-11-09: qty 1080

## 2017-11-09 MED ORDER — SODIUM CHLORIDE 0.9 % IV BOLUS (SEPSIS)
500.0000 mL | Freq: Once | INTRAVENOUS | Status: AC
  Administered 2017-11-09: 500 mL via INTRAVENOUS

## 2017-11-09 MED ORDER — BACLOFEN 1 MG/ML ORAL SUSPENSION
5.0000 mg | Freq: Three times a day (TID) | ORAL | Status: DC
  Administered 2017-11-09 – 2017-11-13 (×7): 5 mg via ORAL
  Filled 2017-11-09 (×18): qty 0.5

## 2017-11-09 NOTE — Plan of Care (Signed)
  Pain Managment: General experience of comfort will improve 11/09/2017 2331 - Progressing by Ruel FavorsBrown-Staples, Wilton Thrall, RN   Physical Regulation: Ability to maintain clinical measurements within normal limits will improve 11/09/2017 2331 - Progressing by Ruel FavorsBrown-Staples, Colm Lyford, RN Will remain free from infection 11/09/2017 2331 - Progressing by Ruel FavorsBrown-Staples, Ziaire Hagos, RN   Skin Integrity: Risk for impaired skin integrity will decrease 11/09/2017 2331 - Progressing by Ruel FavorsBrown-Staples, Madelynne Lasker, RN   Tissue Perfusion: Risk factors for ineffective tissue perfusion will decrease 11/09/2017 2331 - Progressing by Ruel FavorsBrown-Staples, Kinesha Auten, RN

## 2017-11-09 NOTE — Progress Notes (Signed)
At 2100 Patient Bp 88/54. Notified Physician. Patient prescribed Sodium Chloride 0.9% Bolus 500 ml; At 2245 pt BP 91/57

## 2017-11-09 NOTE — Progress Notes (Signed)
PROGRESS NOTE    Anthony Yang  WUJ:[id number] DOB: May 11, 1986 DOA: 23-Aug-2017 PCP: Patient, No Pcp Per    Brief Narrative:  31 year old male who presented with generalized weakness. She does have the significant medical history of HIV/AIDS,(CD4 count 28).He was referred from the ID clinic due to persistent diarrhea and dehydration, patient has lost 50 pounds over last 6 months, recently treated for Giardia.On his initial physical examination, blood pressure 93/68,heart rate 119, temperature 100.1, respiratory 13, oxygen saturation 100%.Dry mucous membranes, lungs clear to auscultation bilaterally, heart S1-S2 present rhythmic, tachycardic, the abdomen was nontender, nondistended, no lower extremity edema. Sodium 124, potassium 3.0, chloride 0.1, bicarbonate 15, glucose 142, BUN 28, creatinine 1.24,, magnesium 1.9,white count 12.0, hemoglobin 9.0, hematocrit 25.9, platelets 146.Urinalysis negative for infection. Chest x-ray negative for infiltrates. EKG sinus tachycardia, 115 beats per minute, normal axis, normal intervals.  Patient was admitted to the hospital working diagnosis of hyponatremia/hypokalemia due to dehydration due to acute on chronic diarrhea,in the setting of immunosuppression.  Prolonged hospital stay, slow improvement in diarrhea and vomiting, not fully tolerating tube feedings, started on TPN, continue aggressive repletion of electrolytes.    Assessment & Plan:   Principal Problem:   Chronic diarrhea Active Problems:   AIDS (acquired immune deficiency syndrome) (HCC)   Non-compliance   Protein-calorie malnutrition, severe   Dehydration   Thrombocytopenia (HCC)   Anemia in other chronic diseases classified elsewhere   AIDS-associated secretory diarrhea (HCC)   1. Hypovolemia due to significant GI losses.Decreased volume loss, will continue nutritional support with tpn, will advance diet to soft and will remove NG tube if good toleration, continue imodium  and ondansetron. Gentle IV fluids for hydration.   2. Acute on chronic anemia plus thrombocytopenia. Will follow cell count in am. Patient is sp 2 units prbc transfusion with good toleration.   1.Hyponatremia/hypokalemia/ hypocalcemia, with non gap metabolic alkalosis. K at 3,4 with Mg at 1,8 and serum bicarbonate at 21, will continue repletion of electrolytes with kcl and magnesium oxide. Will follow renal panel in am, follow on ionized calcium. Continue slow rate of dextrose and lactated ringers.    2.Acute or chronic diarrhea.Seems to be improving,on supportive medical therapy, nutritional support with TPN, will advance diet to soft.    3.HIV-AIDS.On antiretroviral therapy with good toleraion.   DVT prophylaxis:enoxaparin Code Status:full Family Communication:I spoke with care giver at the bedside Disposition Plan:home/ dnf   Consultants:  ID  Gastroenterology  Procedures:    Antimicrobials   Subjective: Patient is feeling better, reports improvement in diarrhea and vomiting, persistent hiccups. Moderate abdominal pain, no chest pain or dyspnea.    Objective: Vitals:   11/08/17 1600 11/08/17 2000 11/08/17 2037 11/09/17 0634  BP: 101/63 97/72 (!) 113/56 (!) 103/50  Pulse: (!) 117 (!) 120 (!) 120 (!) 123  Resp: (!) 21 (!) 28 (!) 27 (!) 27  Temp:   99.2 F (37.3 C) 99.4 F (37.4 C)  TempSrc:   Oral Oral  SpO2: 93% 95% 93% 93%  Weight:    66.8 kg (147 lb 4.8 oz)  Height:        Intake/Output Summary (Last 24 hours) at 11/09/2017 0819 Last data filed at 11/09/2017 0600 Gross per 24 hour  Intake 1251.67 ml  Output 330 ml  Net 921.67 ml   Filed Weights   11/07/17 0358 11/08/17 0445 11/09/17 0634  Weight: 62.6 kg (138 lb) 63.3 kg (139 lb 8 oz) 66.8 kg (147 lb 4.8 oz)    Examination:  General: deconditioned and ill looking appearing Neurology: Awake and alert, non focal  E ENT: mild pallor, no icterus, oral mucosa  moist Cardiovascular: No JVD. S1-S2 present, tachycardic, rhythmic, no gallops, rubs, or murmurs. No lower extremity edema. Pulmonary: on supine auscultation, vesicular breath sounds bilaterally, adequate air movement, no wheezing, rhonchi or rales. Decreased breath sounds at bases.  Gastrointestinal. Abdomen mild distention, no organomegaly, non tender, no rebound or guarding, decreased bowel sounds.  Skin. No rashes Musculoskeletal: no joint deformities     Data Reviewed: I have personally reviewed following labs and imaging studies  CBC: Recent Labs  Lab 11/04/17 1934 11/05/17 0359 11/07/17 0737 11/08/17 0432  WBC 3.4* 4.4 3.6* 5.6  NEUTROABS  --   --  2.2 2.2  HGB 6.4* 8.5* 6.7* 9.3*  HCT 18.5* 24.3* 19.3* 27.0*  MCV 83.7 83.8 85.8 83.3  PLT 32* 29* 34* 34*   Basic Metabolic Panel: Recent Labs  Lab 11/05/17 0359 11/06/17 0532 11/07/17 0737 11/08/17 0432 11/09/17 0456  NA 133* 134* 133* 134* 134*  K 3.3* 3.7 3.9 3.5 3.4*  CL 110 108 109 109 109  CO2 20* 22 20* 22 21*  GLUCOSE 113* 101* 97 101* 130*  BUN 22* 20 21* 20 19  CREATININE 0.70 0.74 0.80 0.81 0.79  CALCIUM 5.5* 6.0* 6.3* 6.1* 6.0*  MG 1.8 1.9 1.7 1.7 1.8  PHOS 2.8  --   --   --  1.9*   GFR: Estimated Creatinine Clearance: 120.7 mL/min (by C-G formula based on SCr of 0.79 mg/dL). Liver Function Tests: Recent Labs  Lab 11/04/17 0850 11/09/17 0456  AST 34 123*  ALT 19 26  ALKPHOS 162* 226*  BILITOT 0.7 1.3*  PROT 4.0* 3.7*  ALBUMIN <1.0* <1.0*   No results for input(s): LIPASE, AMYLASE in the last 168 hours. No results for input(s): AMMONIA in the last 168 hours. Coagulation Profile: No results for input(s): INR, PROTIME in the last 168 hours. Cardiac Enzymes: No results for input(s): CKTOTAL, CKMB, CKMBINDEX, TROPONINI in the last 168 hours. BNP (last 3 results) No results for input(s): PROBNP in the last 8760 hours. HbA1C: No results for input(s): HGBA1C in the last 72  hours. CBG: Recent Labs  Lab 11/08/17 0354 11/08/17 0823 11/08/17 1142 11/08/17 1619 11/08/17 2357  GLUCAP 89 106* 87 95 127*   Lipid Profile: No results for input(s): CHOL, HDL, LDLCALC, TRIG, CHOLHDL, LDLDIRECT in the last 72 hours. Thyroid Function Tests: No results for input(s): TSH, T4TOTAL, FREET4, T3FREE, THYROIDAB in the last 72 hours. Anemia Panel: No results for input(s): VITAMINB12, FOLATE, FERRITIN, TIBC, IRON, RETICCTPCT in the last 72 hours.    Radiology Studies: I have reviewed all of the imaging during this hospital visit personally     Scheduled Meds: . baclofen  5 mg Per Tube TID  . emtricitabine-tenofovir  1 tablet Oral Daily   And  . dolutegravir  50 mg Oral Daily  . magnesium oxide  400 mg Per NG tube TID  . sodium chloride flush  10-40 mL Intracatheter Q12H  . sodium chloride flush  10-40 mL Intracatheter Q12H  . sodium chloride flush  3 mL Intravenous Q12H  . sulfamethoxazole-trimethoprim  20 mL Oral Daily   Continuous Infusions: . sodium chloride 250 mL (11/05/17 0830)  . dextrose 5% lactated ringers 35 mL/hr at 11/08/17 1828  . feeding supplement (VITAL 1.5 CAL) 1,000 mL (11/08/17 1025)  . potassium PHOSPHATE IVPB (mmol)    . TPN ADULT (ION) 40 mL/hr at  11/08/17 1754     LOS: 17 days        Elleen Coulibaly Annett Gula, MD [facility] Pager [phone]

## 2017-11-09 NOTE — Progress Notes (Signed)
[place] NOTE   Pharmacy Consult for TPN Indication: prolonged NPO  Patient Measurements: Height: _0  (167.6 cm) Weight: 147 lb 4.8 oz (66.8 kg) IBW/kg (Calculated) : 63.8 TPN AdjBW (KG): 53.1 Body mass index is 23.77 kg/m.  Assessment:  31 yo M presents on 10/25 with persistent diarrhea and dehydration. Has had recent ~50 lb weight loss over last 6 months. Hx of HV with low CD4 count. Had been off therapy but started on Triumeq in September 2018. Has continued to have excessive vomiting and nausea. Had been on TFs running at goal rate intil 11/4. Now unable to tolerate tube feeds past trickle rate for the last week. Has had poor PO intake since admit and is currently NPO. CT does not show an ileus or obstruction. PICC inserted yesterday. Pharmacy consulted to start TPN.  GI: Emesis down yesterday to 259m (2 occurrences). Last BM 11/10, 14 occurrences yesterday? Albumin < 1.0.  Endo: No hx of DM. CBGs well controlled but slight trend up (80-130s). No SSI Insulin requirements in the past 24 hours: 0 units Lytes: wnl, K borderline low at 3.4. CoCa ok (ionized Ca pending) Phos down to 1.9 today. Mg borderline low at 1.8. Renal: SCr stable, CrCl > 1066mmin. Minimal UOP charted. D5LR at 354mr. BUN 19 Pulm: RA Cards: BP wnl-soft and HR tachy. Given transfusions on 11/9 Hepatobil: AST elevated at 123, ALT ok, and ALK Phos elevated at 226. Tbili up to 1.3. Follow up Trig tomorrow Neuro: ID: Hx of HIV, continues on Truvada and dolutegravir. Afebrile, WBC wnl  Best Practices: SCDs TPN Access: PICC Double lumen TPN start date: 11/10 >>  Nutritional Goals (per RD recommendation on 11/10): KCal: 2000-2200 Protein: 110-125 Fluid: > 2 L  Goal TPN: 59m108m (Provides 108 g and 1710 kcal) Trickle feeds to provide rest of goal  Current Nutrition:  Clear liquid diet (10% eaten) Vital 1.5 Cal at trickle feeds (16g of protein and 360  kcal) TPN  Plan:  Increase TPN to 75 ml/hr Continue Vital 1.5 Cal at 10ml70mper MD TPN provides 108 g of protein, 270 g of dextrose, and 36 g of lipids giving a total of 1,710 kcal which meets ~90% of patient needs. Trickle feeds to meet rest of patients nutrition goal Electrolytes in TPN: increase Phos slightly, Cl:Ac 1:1 Decrease D5LR to 10ml/51monight Add MVI and trace elements in TPN Order sensitive SSI Q8h and adjust as needed Monitor TPN labs F/U improvement in vomiting and nausea, ability to advance TFs   NathanElenor QuinonesmD, BCPS [facility] Pharmacist Pager [phone] 7:43 AM

## 2017-11-10 ENCOUNTER — Inpatient Hospital Stay (HOSPITAL_COMMUNITY): Payer: Medicaid Other

## 2017-11-10 DIAGNOSIS — Z21 Asymptomatic human immunodeficiency virus [HIV] infection status: Secondary | ICD-10-CM

## 2017-11-10 DIAGNOSIS — R627 Adult failure to thrive: Secondary | ICD-10-CM

## 2017-11-10 DIAGNOSIS — I9589 Other hypotension: Secondary | ICD-10-CM

## 2017-11-10 DIAGNOSIS — K529 Noninfective gastroenteritis and colitis, unspecified: Secondary | ICD-10-CM

## 2017-11-10 DIAGNOSIS — E861 Hypovolemia: Secondary | ICD-10-CM

## 2017-11-10 DIAGNOSIS — J80 Acute respiratory distress syndrome: Secondary | ICD-10-CM

## 2017-11-10 LAB — DIFFERENTIAL
BASOS ABS: 0 10*3/uL (ref 0.0–0.1)
BASOS PCT: 0 %
BLASTS: 17 %
Band Neutrophils: 0 %
EOS ABS: 0.1 10*3/uL (ref 0.0–0.7)
EOS PCT: 1 %
LYMPHS PCT: 36 %
Lymphs Abs: 4.9 10*3/uL — ABNORMAL HIGH (ref 0.7–4.0)
MYELOCYTES: 1 %
Metamyelocytes Relative: 2 %
Monocytes Absolute: 0.7 10*3/uL (ref 0.1–1.0)
Monocytes Relative: 5 %
NEUTROS ABS: 5.6 10*3/uL (ref 1.7–7.7)
NEUTROS PCT: 35 %
NRBC: 0 /100{WBCs}
Other: 0 %
Promyelocytes Absolute: 3 %

## 2017-11-10 LAB — TRIGLYCERIDES: Triglycerides: 130 mg/dL (ref <150)

## 2017-11-10 LAB — CBC
HEMATOCRIT: 22.8 % — AB (ref 39.0–52.0)
Hemoglobin: 7.9 g/dL — ABNORMAL LOW (ref 13.0–17.0)
MCH: 29.5 pg (ref 26.0–34.0)
MCHC: 34.6 g/dL (ref 30.0–36.0)
MCV: 85.1 fL (ref 78.0–100.0)
PLATELETS: 28 10*3/uL — AB (ref 150–400)
RBC: 2.68 MIL/uL — ABNORMAL LOW (ref 4.22–5.81)
RDW: 18.9 % — AB (ref 11.5–15.5)
WBC: 13.7 10*3/uL — AB (ref 4.0–10.5)

## 2017-11-10 LAB — GLUCOSE, CAPILLARY
GLUCOSE-CAPILLARY: 130 mg/dL — AB (ref 65–99)
GLUCOSE-CAPILLARY: 74 mg/dL (ref 65–99)
GLUCOSE-CAPILLARY: 63 mg/dL — AB (ref 65–99)

## 2017-11-10 LAB — CALCIUM, IONIZED: CALCIUM, IONIZED, SERUM: 4 mg/dL — AB (ref 4.5–5.6)

## 2017-11-10 LAB — PATHOLOGIST SMEAR REVIEW

## 2017-11-10 MED ORDER — ENSURE ENLIVE PO LIQD
237.0000 mL | Freq: Three times a day (TID) | ORAL | Status: DC
  Administered 2017-11-10 – 2017-11-11 (×2): 237 mL via ORAL

## 2017-11-10 MED ORDER — ADULT MULTIVITAMIN LIQUID CH
5.0000 mL | Freq: Every day | ORAL | Status: DC
  Filled 2017-11-10 (×5): qty 15

## 2017-11-10 MED ORDER — LEVOFLOXACIN IN D5W 750 MG/150ML IV SOLN
750.0000 mg | INTRAVENOUS | Status: DC
  Administered 2017-11-10 – 2017-11-12 (×3): 750 mg via INTRAVENOUS
  Filled 2017-11-10 (×4): qty 150

## 2017-11-10 MED ORDER — SODIUM CHLORIDE 0.9 % IV BOLUS (SEPSIS)
500.0000 mL | Freq: Once | INTRAVENOUS | Status: AC
  Administered 2017-11-10: 500 mL via INTRAVENOUS

## 2017-11-10 MED ORDER — DEXTROSE 50 % IV SOLN
1.0000 | Freq: Once | INTRAVENOUS | Status: AC
  Administered 2017-11-10: 50 mL via INTRAVENOUS
  Filled 2017-11-10: qty 50

## 2017-11-10 MED ORDER — ALPRAZOLAM 0.5 MG PO TABS
1.0000 mg | ORAL_TABLET | Freq: Three times a day (TID) | ORAL | Status: DC | PRN
  Administered 2017-11-10: 1 mg via ORAL
  Filled 2017-11-10: qty 2

## 2017-11-10 NOTE — Progress Notes (Addendum)
PROGRESS NOTE    Anthony Yang  WUJ:[id number] DOB: 1986-07-16 DOA: 10/19/2017 PCP: Patient, No Pcp Per    Brief Narrative:  31 year old male who presented with generalized weakness. She does have the significant medical history of HIV/AIDS,(CD4 count 28).He was referred from the ID clinic due to persistent diarrhea and dehydration, patient has lost 50 pounds over last 6 months, recently treated for Giardia.On his initial physical examination, blood pressure 93/68,heart rate 119, temperature 100.1, respiratory 13, oxygen saturation 100%.Dry mucous membranes, lungs clear to auscultation bilaterally, heart S1-S2 present rhythmic, tachycardic, the abdomen was nontender, nondistended, no lower extremity edema. Sodium 124, potassium 3.0, chloride 0.1, bicarbonate 15, glucose 142, BUN 28, creatinine 1.24,, magnesium 1.9,white count 12.0, hemoglobin 9.0, hematocrit 25.9, platelets 146.Urinalysis negative for infection. Chest x-ray negative for infiltrates. EKG sinus tachycardia, 115 beats per minute, normal axis, normal intervals.  Patient was admitted to the hospital working diagnosis of hyponatremia/hypokalemia due to dehydration due to acute on chronic diarrhea,in the setting of immunosuppression.  Prolonged hospital stay, slow improvement in diarrhea and vomiting, not fully tolerating tube feedings, started on TPN, continue aggressive repletion of electrolytes.   Assessment & Plan:   Principal Problem:   Chronic diarrhea Active Problems:   AIDS (acquired immune deficiency syndrome) (HCC)   Non-compliance   Protein-calorie malnutrition, severe   Dehydration   Thrombocytopenia (HCC)   Anemia in other chronic diseases classified elsewhere   AIDS-associated secretory diarrhea (HCC)   Pressure injury of skin  1. Hypovolemia due to significant GI losses.Patient hypotensive this am, will order bolus NS 500 cc and follow on response. Reported decrease intensity of volume GI loss.     2. Acute on chronic anemia plus thrombocytopenia.suspected reactive to acute illness, continue to decrease platelet count down to 28, with hb down to 7,9, smear with immature cells but no schistocytes. Will hold on heparin products for now. Holding platelet transfusion. Will check fibrinogen.   1.Hyponatremia/hypokalemia/ hypocalcemia, with non gap metabolic alkalosis.K at 3,4 with Mg at 1,8 and serum bicarbonate at 21, will continue repletion of electrolytes with kcl and magnesium oxide. Will follow renal panel in am, follow on ionized calcium. Continue slow rate of dextrose and lactated ringers.    2.Acute or chronic diarrhea. Reports decrease intensity, diet will be advanced to regular as tolerated, ng tube has been removed, continue supportive TPN.  3.HIV-AIDS.Continue with anti retroviral, per ID recommendations.   Late entry, patient noted to have significant tachypnea, with respiratory rate up to 40, chest film personally reviewed noted right lower lobe infiltrate, will change die to pureed with aspiration precautions, start unasyn and will consult speech therapy.   DVT prophylaxis:enoxaparin Code Status:full Family Communication:I spoke with care giver at the bedside Disposition Plan:home/ dnf   Consultants:  ID  Gastroenterology  Procedures:    Antimicrobials    Subjective: Patient feeling anxious, positive pain on the right side of his abdomen, nausea and diarrhea continue to improve, no chest pain or dyspnea.   Objective: Vitals:   11/09/17 2244 11/10/17 0036 11/10/17 0329 11/10/17 0500  BP: (!) 91/57  (!) 88/65   Pulse: (!) 131 (!) 121 (!) 126   Resp: (!) 28 (!) 26 (!) 33   Temp:   98.6 F (37 C)   TempSrc:   Oral   SpO2: 94% 93% 95%   Weight:    66.5 kg (146 lb 9.7 oz)  Height:        Intake/Output Summary (Last 24 hours) at 11/10/2017 0757 Last  data filed at 11/10/2017 0700 Gross per 24 hour  Intake 2175.33 ml  Output 350 ml   Net 1825.33 ml   Filed Weights   11/08/17 0445 11/09/17 0634 11/10/17 0500  Weight: 63.3 kg (139 lb 8 oz) 66.8 kg (147 lb 4.8 oz) 66.5 kg (146 lb 9.7 oz)    Examination:   General: Not in pain or dyspnea, deconditioned Neurology: Awake and alert, non focal, positive anxiety and tremors E ENT: mild pallor, no icterus, oral mucosa moist Cardiovascular: No JVD. S1-S2 present, rhythmic, tachycardic, no gallops, rubs, or murmurs. No lower extremity edema. Pulmonary: vesicular breath sounds bilaterally, adequate air movement, no wheezing, rhonchi or rales. Mild decreased breath sounds due to poor inspiratory effort.  Gastrointestinal. Abdomen with distention and tender to palpation, no organomegaly, no rebound or guarding Skin. No rashes Musculoskeletal: no joint deformities     Data Reviewed: I have personally reviewed following labs and imaging studies  CBC: Recent Labs  Lab 11/04/17 1934 11/05/17 0359 11/07/17 0737 11/08/17 0432  WBC 3.4* 4.4 3.6* 5.6  NEUTROABS  --   --  2.2 2.2  HGB 6.4* 8.5* 6.7* 9.3*  HCT 18.5* 24.3* 19.3* 27.0*  MCV 83.7 83.8 85.8 83.3  PLT 32* 29* 34* 34*   Basic Metabolic Panel: Recent Labs  Lab 11/05/17 0359 11/06/17 0532 11/07/17 0737 11/08/17 0432 11/09/17 0456  NA 133* 134* 133* 134* 134*  K 3.3* 3.7 3.9 3.5 3.4*  CL 110 108 109 109 109  CO2 20* 22 20* 22 21*  GLUCOSE 113* 101* 97 101* 130*  BUN 22* 20 21* 20 19  CREATININE 0.70 0.74 0.80 0.81 0.79  CALCIUM 5.5* 6.0* 6.3* 6.1* 6.0*  MG 1.8 1.9 1.7 1.7 1.8  PHOS 2.8  --   --   --  1.9*   GFR: Estimated Creatinine Clearance: 120.7 mL/min (by C-G formula based on SCr of 0.79 mg/dL). Liver Function Tests: Recent Labs  Lab 11/04/17 0850 11/09/17 0456  AST 34 123*  ALT 19 26  ALKPHOS 162* 226*  BILITOT 0.7 1.3*  PROT 4.0* 3.7*  ALBUMIN <1.0* <1.0*   No results for input(s): LIPASE, AMYLASE in the last 168 hours. No results for input(s): AMMONIA in the last 168  hours. Coagulation Profile: No results for input(s): INR, PROTIME in the last 168 hours. Cardiac Enzymes: No results for input(s): CKTOTAL, CKMB, CKMBINDEX, TROPONINI in the last 168 hours. BNP (last 3 results) No results for input(s): PROBNP in the last 8760 hours. HbA1C: No results for input(s): HGBA1C in the last 72 hours. CBG: Recent Labs  Lab 11/08/17 2357 11/09/17 1333 11/09/17 1638 11/09/17 2353 11/10/17 0612  GLUCAP 127* 135* 144* 130* 130*   Lipid Profile: No results for input(s): CHOL, HDL, LDLCALC, TRIG, CHOLHDL, LDLDIRECT in the last 72 hours. Thyroid Function Tests: No results for input(s): TSH, T4TOTAL, FREET4, T3FREE, THYROIDAB in the last 72 hours. Anemia Panel: No results for input(s): VITAMINB12, FOLATE, FERRITIN, TIBC, IRON, RETICCTPCT in the last 72 hours.    Radiology Studies: I have reviewed all of the imaging during this hospital visit personally     Scheduled Meds: . baclofen  5 mg Oral TID  . emtricitabine-tenofovir  1 tablet Oral Daily   And  . dolutegravir  50 mg Oral Daily  . insulin aspart  0-9 Units Subcutaneous Q8H  . magnesium oxide  400 mg Oral TID  . sodium chloride flush  10-40 mL Intracatheter Q12H  . sodium chloride flush  10-40 mL Intracatheter  Q12H  . sodium chloride flush  3 mL Intravenous Q12H  . sulfamethoxazole-trimethoprim  20 mL Oral Daily   Continuous Infusions: . sodium chloride 250 mL (11/09/17 1000)  . dextrose 5% lactated ringers 35 mL/hr at 11/10/17 0100  . TPN ADULT (ION) 75 mL/hr at 11/10/17 0100     LOS: 18 days        Mauricio Annett Gulaaniel Arrien, MD [facility] Pager [phone]

## 2017-11-10 NOTE — Progress Notes (Signed)
Patient respirations 40-45, notified provider of rate.

## 2017-11-10 NOTE — Progress Notes (Signed)
Patient blood sugar 74, notified provider of blood sugar results

## 2017-11-10 NOTE — Progress Notes (Addendum)
Pharmacy Antibiotic Note Anthony Yang is a 31 y.o. male admitted on 10/18/2017 with FTT in setting of HIV. Currently receiving TPN. Pharmacy consulted for Levaquin dosing due to concern for pneumonia.   Plan:  1. Levaquin 750 mg IV every 24 hours  2. Will follow peripherally     Height: 5\' 6"  (167.6 cm) Weight: 146 lb 9.7 oz (66.5 kg) IBW/kg (Calculated) : 63.8  Temp (24hrs), Avg:98.9 F (37.2 C), Min:98.6 F (37 C), Max:99.4 F (37.4 C)  Recent Labs  Lab 11/04/17 1934 11/05/17 0359 11/06/17 0532 11/07/17 0737 11/08/17 0432 11/09/17 0456 11/10/17 0749  WBC 3.4* 4.4  --  3.6* 5.6  --  13.7*  CREATININE  --  0.70 0.74 0.80 0.81 0.79  --     Estimated Creatinine Clearance: 120.7 mL/min (by C-G formula based on SCr of 0.79 mg/dL).    Allergies  Allergen Reactions  . Penicillins Hives    Has patient had a PCN reaction causing immediate rash, facial/tongue/throat swelling, SOB or lightheadedness with hypotension: Yes Has patient had a PCN reaction causing severe rash involving mucus membranes or skin necrosis: No Has patient had a PCN reaction that required hospitalization: No Has patient had a PCN reaction occurring within the last 10 years: No If all of the above answers are "NO", then may proceed with Cephalosporin use.    Thank you for allowing pharmacy to be a part of this patient's care.  Pollyann SamplesAndy Kindel Rochefort, PharmD, BCPS 11/10/2017, 2:27 PM

## 2017-11-10 NOTE — Progress Notes (Signed)
CRITICAL VALUE ALERT  Critical Value:  28  Date & Time Notied:  11/10/2017  Provider Notified: Dr. Ella JubileeArrien  Orders Received/Actions taken: Awaiting

## 2017-11-10 NOTE — Consult Note (Addendum)
WOC Nurse wound consult note Reason for Consult: Consult requested for sacrum. Pt is very emaciated and frequently incontinent of stool.  It is difficult to keep the affected area from becoming soiled related to the close proximity. Wound type: Stage 2 Pressure Injury POA: No Measurement: 2X.2X.1cm Wound bed: pink and dry Drainage (amount, consistency, odor) no odor or drainage Periwound: Intact skin surrounding. Dressing procedure/placement/frequency: Foam dressing to protect and promote healing.  Air mattress to reduce pressure.  Discussed plan of care with patient's father at the bedside.  Please re-consult if further assistance is needed.  Thank-you,  Cammie Mcgeeawn Rishita Petron MSN, RN, CWOCN, [place]CN[place]AP, CNS [phone]

## 2017-11-10 NOTE — Progress Notes (Signed)
PT Cancellation Note  Patient Details Name: Verlan FriendsMarcus L Cullinane MRN: [id number] DOB: 25-Dec-1986   Cancelled Treatment:    Reason Eval/Treat Not Completed: Medical issues which prohibited therapy. Pt hypotensive this AM. Currently receiving bolus fluids. PT to re-attempt as time allows.   Ilda FoilGarrow, Nacole Fluhr Rene 11/10/2017, 11:23 AM

## 2017-11-10 NOTE — Progress Notes (Signed)
[place] NOTE   Pharmacy Consult for TPN Indication: prolonged NPO  Patient Measurements: Height: 5' 6"  (167.6 cm) Weight: 146 lb 9.7 oz (66.5 kg) IBW/kg (Calculated) : 63.8 TPN AdjBW (KG): 53.1 Body mass index is 23.66 kg/m.  Assessment:  31 yo M presents on 10/25 with persistent diarrhea and dehydration. Has had recent ~50 lb weight loss over last 6 months. Hx of HIV with low CD4 count. Had been off therapy but started on Triumeq in September 2018. Has continued to have excessive vomiting and nausea. Had been on TFs running at goal rate until 11/4. Now unable to tolerate tube feeds past trickle rate for the last week. Has had poor PO intake since admit and is currently NPO. CT does not show an ileus or obstruction. PICC inserted yesterday. Pharmacy consulted to start TPN.  GI: Emesis down yesterday to 269m (2 occurrences). Last BM 11/11, down to 3 occurrences yesterday, Albumin < 1.0.  Endo: No hx of DM. CBGs well controlled. No SSI Insulin requirements in the past 24 hours: 0 units Lytes: wnl, K borderline low at 3.4. CoCa ok (ionized Ca pending) Phos down to 1.9 today. Mg borderline low at 1.8 - on 400 mg PO TID. Renal: SCr stable, CrCl > 1023mmin. Minimal UOP charted, net +7.9L since admit. D5LR at 3544mr. BUN 19 Pulm: RA Cards: BP wnl-soft and HR tachy. Given transfusions on 11/9 Hepatobil: AST elevated at 123, ALT ok, and ALK Phos elevated at 226. Tbili up to 1.3. TG 130 Neuro: ID: Hx of HIV, continues on Truvada and dolutegravir. Afebrile, WBC wnl  Best Practices: SCDs TPN Access: PICC Double lumen TPN start date: 11/10 >>  Nutritional Goals (per RD recommendation on 11/10): KCal: 2000-2200 Protein: 110-125 Fluid: > 2 L  Goal TPN: 69m71m (Provides 108 g and 1710 kcal)  Current Nutrition:  Advanced to regular diet TPN  Plan:  Discontinue TPN - spoke with RN about transitioning off Vital 1.5 Cal discontinued Discontinue  sensitive SSI and D5LRItems to address/do when discontinuing a TPN:   JennRenold GentaarmD, BCPS Clinical Pharmacist Phone for today - [phone] 7:36 AM

## 2017-11-10 NOTE — Progress Notes (Signed)
Patient has been clinically deteriorating through the course of the day, follow-up chest x-ray showing bilateral interstitail infiltrates, more dense at the right base. He had episode of hypotension this morning, that responded to 500 mL bolus of normal saline. Has developed persistent tachypnea up to 40 breaths per minute, his heart rate has been at 130 range, and is showing a significant decrease in his mentation.  Patient is developing severe sepsis, ARDS, with imminent risk of shock.   Currently he is poorly responsive to stimuli, critically ill, with imminent risk of further hemodynamic deterioration.  I have spoken to his parents at the bedside and explain patient's situation, critical condition and high risk of deterioration including cardiac and respiratory arrest. Patient does have very poor prognosis due to his advanced HIV AIDS, persistent diarrhea and volume loss.    Patient's parents will decide about code status in the next few hours and will let the medical team know.   Critical care time 60 minutes.

## 2017-11-10 NOTE — Progress Notes (Signed)
Notified provider of patient B/P 83/65. Provider order bolus  NS

## 2017-11-10 NOTE — Progress Notes (Signed)
    [facility]   Reason for visit: Follow up on FTT, HIV  Interval History: continues on Tivicay and Truvada and took the pills orally today without vomiting.  On TPN now via PICC and getting more calories in.  Not taking much per os.  No fever.     Physical Exam: Constitutional:  Vitals:   11/10/17 0832 11/10/17 0935  BP:    Pulse: (!) 131   Resp: (!) 27   Temp:  98.8 F (37.1 C)  SpO2: 94%    patient appears in NAD, thin Eyes: anicteric HENT: no thrush Respiratory: Normal respiratory effort; CTA B Cardiovascular: RRR GI: soft, nt, nd  Review of Systems: Constitutional: negative for fevers and chills Gastrointestinal: positive for nausea, negative for vomiting  Lab Results  Component Value Date   WBC 13.7 (H) 11/10/2017   HGB 7.9 (L) 11/10/2017   HCT 22.8 (L) 11/10/2017   MCV 85.1 11/10/2017   PLT 28 (LL) 11/10/2017    Lab Results  Component Value Date   CREATININE 0.79 11/09/2017   BUN 19 11/09/2017   NA 134 (L) 11/09/2017   K 3.4 (L) 11/09/2017   CL 109 11/09/2017   CO2 21 (L) 11/09/2017    Lab Results  Component Value Date   ALT 26 11/09/2017   AST 123 (H) 11/09/2017   ALKPHOS 226 (H) 11/09/2017     Microbiology: No results found for this or any previous visit (from the past 240 hour(s)).  Impression/Plan:  1. HIV - continuing to get ARVs which will be key in improvement.  Started on 10/25 so will recheck CD4, viral load later this week.   2.  FTT - now on TPN and hopefully will have better improvement with continued nutrition.   Monitoring labs for refeeding. Continuing oral feedings as tolerated.    3.  Hypovolemia - getting intermittent boluses due to volume loss.   4.  Chronic diarrhea - no infectious etiology noted.  Some improvement overall.

## 2017-11-10 NOTE — Progress Notes (Signed)
Paged by RN regarding pts family wanting to discuss pts code status. His parents discussed pts prognosis and critical condition with Dr. Ella JubileeArrien during rounds today as well. Pt is drowsy, minimally responsive and not alert according to bedside RN. Discussed with pts parents and explained about code status and what a DNR entails. There wishes are that his code status be changed to a DNR. Code status has been changed at this time to a DNR.  Anthony Kernharles Pauleen Goleman NP-C [facility]

## 2017-11-10 NOTE — Progress Notes (Signed)
Spoke with Dr. Ella JubileeArrien, advised that I hold PO medications until further notified. Asked provider if we could get something for diarrhea via IV.

## 2017-11-10 NOTE — Progress Notes (Signed)
Notified provider that B/P came up after bolus 88/55

## 2017-11-10 NOTE — Progress Notes (Signed)
Spoke with provider notified of RR, pt. complaint of pain in rt. Abdominal area. Provider advised to hold LR infusion until we get results of chest xray.

## 2017-11-11 ENCOUNTER — Inpatient Hospital Stay (HOSPITAL_COMMUNITY): Payer: Medicaid Other

## 2017-11-11 DIAGNOSIS — A419 Sepsis, unspecified organism: Secondary | ICD-10-CM

## 2017-11-11 DIAGNOSIS — N179 Acute kidney failure, unspecified: Secondary | ICD-10-CM

## 2017-11-11 DIAGNOSIS — J181 Lobar pneumonia, unspecified organism: Secondary | ICD-10-CM

## 2017-11-11 LAB — GLUCOSE, CAPILLARY
GLUCOSE-CAPILLARY: 90 mg/dL (ref 65–99)
GLUCOSE-CAPILLARY: 85 mg/dL (ref 65–99)
Glucose-Capillary: 66 mg/dL (ref 65–99)
Glucose-Capillary: 83 mg/dL (ref 65–99)
Glucose-Capillary: 99 mg/dL (ref 65–99)

## 2017-11-11 LAB — BASIC METABOLIC PANEL
ANION GAP: 5 (ref 5–15)
BUN: 34 mg/dL — ABNORMAL HIGH (ref 6–20)
CHLORIDE: 111 mmol/L (ref 101–111)
CO2: 19 mmol/L — AB (ref 22–32)
CREATININE: 1.11 mg/dL (ref 0.61–1.24)
Calcium: 5.8 mg/dL — CL (ref 8.9–10.3)
GFR calc non Af Amer: >60 mL/min (ref >60)
GLUCOSE: 39 mg/dL — AB (ref 65–99)
Potassium: 3.5 mmol/L (ref 3.5–5.1)
Sodium: 135 mmol/L (ref 135–145)

## 2017-11-11 LAB — ACID FAST CULTURE WITH REFLEXED SENSITIVITIES: ACID FAST CULTURE - AFSCU3: NEGATIVE

## 2017-11-11 LAB — CBC WITH DIFFERENTIAL/PLATELET
BAND NEUTROPHILS: 7 %
BASOS PCT: 0 %
Basophils Absolute: 0 10*3/uL (ref 0.0–0.1)
Blasts: 0 %
EOS PCT: 1 %
Eosinophils Absolute: 0.1 10*3/uL (ref 0.0–0.7)
HCT: 20.1 % — ABNORMAL LOW (ref 39.0–52.0)
HEMOGLOBIN: 7 g/dL — AB (ref 13.0–17.0)
LYMPHS PCT: 38 %
Lymphs Abs: 5.4 10*3/uL — ABNORMAL HIGH (ref 0.7–4.0)
MCH: 28.6 pg (ref 26.0–34.0)
MCHC: 34.8 g/dL (ref 30.0–36.0)
MCV: 82 fL (ref 78.0–100.0)
Metamyelocytes Relative: 0 %
Monocytes Absolute: 0.7 10*3/uL (ref 0.1–1.0)
Monocytes Relative: 5 %
Myelocytes: 0 %
NEUTROS PCT: 49 %
NRBC: 0 /100{WBCs}
Neutro Abs: 8 10*3/uL — ABNORMAL HIGH (ref 1.7–7.7)
OTHER: 0 %
Platelets: 16 10*3/uL — CL (ref 150–400)
Promyelocytes Absolute: 0 %
RBC: 2.45 MIL/uL — ABNORMAL LOW (ref 4.22–5.81)
RDW: 18.4 % — ABNORMAL HIGH (ref 11.5–15.5)
WBC: 14.2 10*3/uL — ABNORMAL HIGH (ref 4.0–10.5)

## 2017-11-11 LAB — MAGNESIUM: Magnesium: 1.9 mg/dL (ref 1.7–2.4)

## 2017-11-11 LAB — FIBRINOGEN: Fibrinogen: 635 mg/dL — ABNORMAL HIGH (ref 210–475)

## 2017-11-11 MED ORDER — DEXTROSE 50 % IV SOLN
25.0000 mL | Freq: Once | INTRAVENOUS | Status: AC
  Administered 2017-11-11: 25 mL via INTRAVENOUS

## 2017-11-11 MED ORDER — DEXTROSE 50 % IV SOLN
INTRAVENOUS | Status: AC
  Filled 2017-11-11: qty 50

## 2017-11-11 MED ORDER — SODIUM CHLORIDE 0.9 % IV SOLN
1.0000 g | Freq: Once | INTRAVENOUS | Status: AC
  Administered 2017-11-11: 1 g via INTRAVENOUS
  Filled 2017-11-11: qty 10

## 2017-11-11 MED ORDER — VANCOMYCIN HCL IN DEXTROSE 750-5 MG/150ML-% IV SOLN
750.0000 mg | Freq: Three times a day (TID) | INTRAVENOUS | Status: DC
  Administered 2017-11-11 – 2017-11-12 (×3): 750 mg via INTRAVENOUS
  Filled 2017-11-11 (×4): qty 150

## 2017-11-11 MED ORDER — DEXTROSE 50 % IV SOLN
INTRAVENOUS | Status: AC
  Administered 2017-11-11: 06:00:00
  Filled 2017-11-11: qty 50

## 2017-11-11 MED ORDER — DEXTROSE IN LACTATED RINGERS 5 % IV SOLN
INTRAVENOUS | Status: DC
  Administered 2017-11-11 (×2): via INTRAVENOUS

## 2017-11-11 MED ORDER — RESOURCE THICKENUP CLEAR PO POWD
ORAL | Status: DC | PRN
  Filled 2017-11-11: qty 125

## 2017-11-11 NOTE — Progress Notes (Signed)
Modified Barium Swallow Progress Note  Patient Details  Name: Anthony Yang MRN: [id number] Date of Birth: [birth year]  Today's Date: 11/11/2017  Modified Barium Swallow completed.  Full report located under Chart Review in the Imaging Section.  Brief recommendations include the following:  Clinical Impression  Pt exhibits decreased lingual strength, control and cohesion leading to moderate oral lingual residue spilling to pyriform sinuses and penetrating laryngeal vestibule with thin while pt at rest. Nectar thick penetrated x 1 during second swallow with only trace remaining. Intermittent awareness of residue requiring verbal cues for dry swallow 40% of the time. Pharyngeal strength was overall intact. Pt is at high aspiration risk given significant deconditioning and severity of illness. Educated pt and parents and discussed recommendations. Recommend Dys 2 texture, nectar thick and allow ice chips and sips thin water after oral care. If pt has difficulty masticating Dys 2 or refused thickened liquids, can discuss comfort feeds with risks with pt and mom/dad.       Swallow Evaluation Recommendations       SLP Diet Recommendations: Dysphagia 2 (Fine chop) solids;Nectar thick liquid(allow sips thin water, ice chips)   Liquid Administration via: Cup;Straw(straw with nectar only)   Medication Administration: Crushed with puree   Supervision: Staff to assist with self feeding;Full supervision/cueing for compensatory strategies   Compensations: Minimize environmental distractions;Slow rate;Small sips/bites;Lingual sweep for clearance of pocketing;Multiple dry swallows after each bite/sip(cough throughout meals)   Postural Changes: Remain semi-upright after after feeds/meals (Comment);Seated upright at 90 degrees   Oral Care Recommendations: Oral care BID   Other Recommendations: Order thickener from pharmacy    [name], Tally Mckinnon Willis 11/11/2017,12:29 PM   Breck CoonsLisa Willis Lonell FaceLitaker M.Ed  [employer]-SLP Pager [phone]

## 2017-11-11 NOTE — Evaluation (Signed)
Clinical/Bedside Swallow Evaluation Patient Details  Name: Anthony Yang MRN: [id number] Date of Birth: Jan 05, 1986  Today's Date: 11/11/2017 Time: SLP Start Time (ACUTE ONLY): [id number] SLP Stop Time (ACUTE ONLY): 0840 SLP Time Calculation (min) (ACUTE ONLY): 17 min  Past Medical History:  Past Medical History:  Diagnosis Date  . AIDS (acquired immune deficiency syndrome) (HCC)    hx/notes 09/25/2017  . Anemia   . Anemia in other chronic diseases classified elsewhere 11/02/2017  . History of blood transfusion 09/25/2017   "anemic"  . HIV (human immunodeficiency virus infection) (HCC)    hx/notes 09/24/2017   Past Surgical History:  Past Surgical History:  Procedure Laterality Date  . FRACTURE SURGERY    . ORIF FINGER / THUMB FRACTURE Right   . WRIST FRACTURE SURGERY Left    HPI:  31 year old male who presents with generalized weakness. PMH: HIV/AIDS,anemia. Pt referred from the ID clinic due to persistent diarrhea and dehydration, 50 pound weight loss over last 6 months, recently treated for Giardia.Found to have hyponatremia/hypokalemia due to dehydration due to acute on chronic diarrhea,in the setting of immunosuppression. CT abdomen 10/29 revealed bilateral pleural effusions, bibasilar densities, predominantly involving the right lung base are concerning for an infectious process; thickened appearance of the distal esophagus as well as diffusely thickened small bowel. CXR 11/12 Extensive left lower lobe airspace consolidation with left pleural effusion. Widespread pulmonary edema appearance. Suspect widespread interstitial pneumonitis, likely of infectious etiology. BSE 11/3 with aversion behaviors with suspected psychological in nature; aversion may be due to underlying GI issues.    Assessment / Plan / Recommendation Clinical Impression  Pt's swallow function appears to have declined compared to prior bedside swallow evaluation 11/3. Volitional cough is weaker, + s/s possible  aspiration with delayed cough, labial/lingual strength is decreased. No behavioral issues re: food present as was observed with prior assessment. Given clinical observation and suspect pneumonitis recommend MBS to fully assess.   SLP Visit Diagnosis: Dysphagia, unspecified (R13.10)    Aspiration Risk  (mild-mod)    Diet Recommendation NPO        Other  Recommendations     Follow up Recommendations   TBD     Frequency and Duration            Prognosis        Swallow Study   General HPI: 31 year old male who presents with generalized weakness. PMH: HIV/AIDS,anemia. Pt referred from the ID clinic due to persistent diarrhea and dehydration, 50 pound weight loss over last 6 months, recently treated for Giardia.Found to have hyponatremia/hypokalemia due to dehydration due to acute on chronic diarrhea,in the setting of immunosuppression. CT abdomen 10/29 revealed bilateral pleural effusions, bibasilar densities, predominantly involving the right lung base are concerning for an infectious process; thickened appearance of the distal esophagus as well as diffusely thickened small bowel. CXR 11/12 Extensive left lower lobe airspace consolidation with left pleural effusion. Widespread pulmonary edema appearance. Suspect widespread interstitial pneumonitis, likely of infectious etiology. BSE 11/3 with aversion behaviors with suspected psychological in nature; aversion may be due to underlying GI issues.  Type of Study: Bedside Swallow Evaluation Previous Swallow Assessment: (see HPI) Diet Prior to this Study: NPO Temperature Spikes Noted: Yes Respiratory Status: Nasal cannula History of Recent Intubation: No Behavior/Cognition: Alert;Cooperative Oral Cavity Assessment: Dry Oral Care Completed by SLP: No Oral Cavity - Dentition: Adequate natural dentition Vision: Functional for self-feeding Self-Feeding Abilities: Needs assist;Needs set up Patient Positioning: Upright in bed Baseline  Vocal Quality: Low vocal intensity  Volitional Cough: Weak Volitional Swallow: Able to elicit    Oral/Motor/Sensory Function Overall Oral Motor/Sensory Function: Generalized oral weakness   Ice Chips Ice chips: Within functional limits   Thin Liquid Thin Liquid: Impaired Presentation: Cup;Straw Oral Phase Functional Implications: (none) Pharyngeal  Phase Impairments: Cough - Delayed    Nectar Thick Nectar Thick Liquid: Not tested   Honey Thick Honey Thick Liquid: Not tested   Puree Puree: Impaired Presentation: Spoon;Self Fed Oral Phase Impairments: Reduced labial seal Oral Phase Functional Implications: (labial residue min)   Solid   GO   Solid: Within functional limits Presentation: Self Fed        [name], Sham Alviar Willis 11/11/2017,8:52 AM   Breck CoonsLisa Willis Lonell FaceLitaker M.Ed [employer]SLP Pager [phone]

## 2017-11-11 NOTE — Progress Notes (Signed)
ANTIBIOTIC CONSULT NOTE - INITIAL  Pharmacy Consult for Vancomycin Indication: pneumonia  Allergies  Allergen Reactions  . Penicillins Hives    Has patient had a PCN reaction causing immediate rash, facial/tongue/throat swelling, SOB or lightheadedness with hypotension: Yes Has patient had a PCN reaction causing severe rash involving mucus membranes or skin necrosis: No Has patient had a PCN reaction that required hospitalization: No Has patient had a PCN reaction occurring within the last 10 years: No If all of the above answers are "NO", then may proceed with Cephalosporin use.    Patient Measurements: Height: 5\' 6"  (167.6 cm) Weight: 152 lb 1.9 oz (69 kg) IBW/kg (Calculated) : 63.8 Adjusted Body Weight:    Vital Signs: Temp: 100.8 F (38.2 C) (11/13 0800) Temp Source: Axillary (11/13 0800) BP: 86/52 (11/13 0800) Pulse Rate: 137 (11/13 0800) Intake/Output from previous day: 11/12 0701 - 11/13 0700 In: 120 [P.O.:120] Out: -  Intake/Output from this shift: Total I/O In: 47.5 [I.V.:47.5] Out: -   Labs: Recent Labs    11/09/17 0456 11/10/17 0749 11/11/17 0410  WBC  --  13.7* 14.2*  HGB  --  7.9* 7.0*  PLT  --  28* 16*  CREATININE 0.79  --  1.11   Estimated Creatinine Clearance: 87 mL/min (by C-G formula based on SCr of 1.11 mg/dL). No results for input(s): VANCOTROUGH, VANCOPEAK, VANCORANDOM, GENTTROUGH, GENTPEAK, GENTRANDOM, TOBRATROUGH, TOBRAPEAK, TOBRARND, AMIKACINPEAK, AMIKACINTROU, AMIKACIN in the last 72 hours.   Microbiology:   Medical History: Past Medical History:  Diagnosis Date  . AIDS (acquired immune deficiency syndrome) (HCC)    hx/notes 09/25/2017  . Anemia   . Anemia in other chronic diseases classified elsewhere 11/02/2017  . History of blood transfusion 09/25/2017   "anemic"  . HIV (human immunodeficiency virus infection) (HCC)    hx/notes 09/24/2017    Assessment:  ID: on home Biktarvy, Septra daily for PCP px; CD 4 count 12, VL 611k.  Per ID- + CMV PCR however uncertain significance at this point. Recent flagyl for diarrhea but cdiff neg Tmax 100.8, WBC 14.2 up,  10/26 CMV antibody >> positive (greater than 10) 10/25 urine - insignificant 10/25 blood x 2 - negative 10/25 C diff PCR negative 10/25 GI panel - negative  Albendazole 10/30-11/8 Levaquin 11/12>> Vanco 11/13>>  Goal of Therapy:  Vancomycin trough level 15-20 mcg/ml  Plan:  Levaquin 750mg  IV q 24h Vanco 750mg  IV q 8 hrs. Trough after 3-5 doses at steady state. Monitor Scr    Delois Tolbert S. Merilynn Finlandobertson, PharmD, BCPS Clinical Staff Pharmacist Pager [phone]  Misty Stanleyobertson, Shanise Balch Stillinger 11/11/2017,10:26 AM

## 2017-11-11 NOTE — Progress Notes (Signed)
Hypoglycemic Event  CBG: 66  Treatment: 25 mL dextrose 50% IV   Symptoms: none  Follow-up CBG: Time: 1328 CBG Result:  85  Possible Reasons for Event: pt was NPO  Comments/MD notified: none    Jefferson FuelLauren B Moffitt

## 2017-11-11 NOTE — Progress Notes (Signed)
CRITICAL VALUE ALERT  Critical Value:  Glu 39; Cal 5.8  Date & Time Notied:  11/11/17; 5:00am  Provider Notified: Page Craige CottaKirby  Orders Received/Actions taken: Patient received Dextrose 50%; no further orders

## 2017-11-11 NOTE — Plan of Care (Signed)
  Pain Managment: General experience of comfort will improve 11/11/2017 0215 - Progressing by Ruel FavorsBrown-Staples, Aela Bohan, RN

## 2017-11-11 NOTE — Progress Notes (Signed)
Nurse suggested this family would likely appreciate support- dad and brother of patient Had prayer with them. They didn't seem to be interested in talking. Phebe CollaDonna S Math Brazie, Chaplain   11/11/17 1300  Clinical Encounter Type  Visited With Patient and family together  Visit Type Follow-up;Spiritual support  Referral From Nurse  Consult/Referral To Chaplain  Spiritual Encounters  Spiritual Needs Prayer;Emotional  Stress Factors  Patient Stress Factors None identified  Family Stress Factors None identified

## 2017-11-11 NOTE — Progress Notes (Signed)
PT Cancellation Note  Patient Details Name: Anthony Yang MRN: [id number] DOB: Dec 08, 1986   Cancelled Treatment:    Reason Eval/Treat Not Completed: Medical issues which prohibited therapy. RN felt pt's status to be too unstable for PT due to hypoglycemic incident and increased RR. Will reschedule for tomorrow.  Anthony Yang, SPTA  Anthony Yang 11/11/2017, 5:19 PM

## 2017-11-11 NOTE — Progress Notes (Addendum)
PROGRESS NOTE    Anthony Yang  GNF:[id number] DOB: 08-16-1986 DOA: 10/26/2017 PCP: Patient, No Pcp Per    Brief Narrative:  31 year old male who presented with generalized weakness. She does have the significant medical history of HIV/AIDS,(CD4 count 28).He was referred from the ID clinic due to persistent diarrhea and dehydration, patient has lost 50 pounds over last 6 months, recently treated for Giardia.On his initial physical examination, blood pressure 93/68,heart rate 119, temperature 100.1, respiratory 13, oxygen saturation 100%.Dry mucous membranes, lungs clear to auscultation bilaterally, heart S1-S2 present rhythmic, tachycardic, the abdomen was nontender, nondistended, no lower extremity edema. Sodium 124, potassium 3.0, chloride 0.1, bicarbonate 15, glucose 142, BUN 28, creatinine 1.24,, magnesium 1.9,white count 12.0, hemoglobin 9.0, hematocrit 25.9, platelets 146.Urinalysis negative for infection. Chest x-ray negative for infiltrates. EKG sinus tachycardia, 115 beats per minute, normal axis, normal intervals.  Patient was admitted to the hospital working diagnosis of hyponatremia/hypokalemia due to dehydration due to acute on chronic diarrhea,in the setting of immunosuppression.  Prolonged hospital stay, slow improvement in diarrhea and vomiting, not fully tolerating tube feedings, started on TPN, continue aggressive repletion of electrolytes.  Patient developed aspiration pneumonia and sepsis, started on Levofloxacin IV, patient with very poor prognosis and critically ill. Family meeting took place and patient's code status has been changed to DNR.    Assessment & Plan:   Principal Problem:   Chronic diarrhea Active Problems:   AIDS (acquired immune deficiency syndrome) (HCC)   Non-compliance   Protein-calorie malnutrition, severe   Dehydration   Thrombocytopenia (HCC)   Anemia in other chronic diseases classified elsewhere   AIDS-associated secretory diarrhea  (HCC)   Pressure injury of skin  1. Sepsis due to aspiration pneumonia, not present on admission. Patient started on IV levofloxacin, will add IV vancomycin, will resume IV fluids with caution not to provoke volume overload. Patient with persistent tachycardia and tachypnea, poor prognosis. Will add stress dose steroids. Patient's family agree for bipap if needed but no invasive mechanical ventilation. Check swallow evaluation. Follow up chest film personally reviewed noted infiltrates on the right lower lobe and left upper lobe.   2. Hypovolemia due to significant GI losses.Patient had decreased volume loss, no developing third spacing and edema. Will continue supportive care, gentle fluids.   3. Acute on chronic anemia plus thrombocytopenia.Continue worsening anemia, and thrombocytopenia, no signs of hemolysis and fibrinogen not low, suggesting no DIC. Likely reactive to sepsis. Very poor prognosis.   4.AKI with yponatremia/hypokalemia/ hypocalcemia, with non gap metabolic alkalosis.Worsening renal function with serum cr 1,11 from 0,79, K at 3,5 ans serum bicarbonate at 19. Will resume IV fluids, avoid further hypotension or nephrotoxic agents, keep MAP at 65. Will replete calcium with calcium gluconate. Note that when serum ca at 6.0 the ionized calcium is at 4.0.   5.Acute or chronic diarrhea. Clinically had decreased diarrhea, will continue supportive medica therapy.   3.HIV-AIDS.Continue as tolerated withanti retroviral, per ID recommendations.   Patient is critically ill with high risk clinically deterioration, currently developing multiorgan failure.   Critical care time 60 minutes.   DVT prophylaxis:enoxaparin Code Status:full Family Communication:I spoke with care giver at the bedside Disposition Plan:home/ dnf   Consultants:  ID  Gastroenterology  Procedures:    Antimicrobials    Subjective: This am more awake but very deconditioned and  weak, code status has been changed to DNR. Patient complains of dyspnea but no frank pain. No further vomiting or nausea, diarrhea has been improving.   Objective: Vitals:  11/10/17 1946 11/11/17 0258 11/11/17 0502 11/11/17 0800  BP: (!) 92/52  (!) 93/54 (!) 86/52  Pulse: (!) 148 (!) 133 (!) 133 (!) 137  Resp: (!) 31 (!) 28 (!) 28 (!) 35  Temp: 100.2 F (37.9 C) 99.3 F (37.4 C) (!) 100.4 F (38 C) (!) 100.8 F (38.2 C)  TempSrc: Oral  Axillary Axillary  SpO2: 92% (!) 87% 93% 92%  Weight:   69 kg (152 lb 1.9 oz)   Height:        Intake/Output Summary (Last 24 hours) at 11/11/2017 0833 Last data filed at 11/11/2017 0300 Gross per 24 hour  Intake 120 ml  Output -  Net 120 ml   Filed Weights   11/09/17 0634 11/10/17 0500 11/11/17 0502  Weight: 66.8 kg (147 lb 4.8 oz) 66.5 kg (146 lb 9.7 oz) 69 kg (152 lb 1.9 oz)    Examination:   General: very deconditioned and ill looking appearing Neurology: Awake and alert, non focal E ENT: positive pallor, no icterus, oral mucosa dry Cardiovascular: No JVD. S1-S2 present, tachycardic rhythmic, no gallops, rubs, or murmurs. ++ pitting lower extremity edema. Pulmonary: decreased breath sounds bilaterally, poor air movement, no wheezing, bilateral rhonchi and rales. Gastrointestinal. Abdomen mild distended with decreased bowel sounds, no organomegaly, mild tender to palpation, no rebound or guarding Skin. No rashes Musculoskeletal: no joint deformities     Data Reviewed: I have personally reviewed following labs and imaging studies  CBC: Recent Labs  Lab 11/05/17 0359 11/07/17 0737 11/08/17 0432 11/10/17 0749 11/11/17 0410  WBC 4.4 3.6* 5.6 13.7* 14.2*  NEUTROABS  --  2.2 2.2 5.6 8.0*  HGB 8.5* 6.7* 9.3* 7.9* 7.0*  HCT 24.3* 19.3* 27.0* 22.8* 20.1*  MCV 83.8 85.8 83.3 85.1 82.0  PLT 29* 34* 34* 28* 16*   Basic Metabolic Panel: Recent Labs  Lab 11/05/17 0359 11/06/17 0532 11/07/17 0737 11/08/17 0432 11/09/17 0456  11/11/17 0410  NA 133* 134* 133* 134* 134* 135  K 3.3* 3.7 3.9 3.5 3.4* 3.5  CL 110 108 109 109 109 111  CO2 20* 22 20* 22 21* 19*  GLUCOSE 113* 101* 97 101* 130* 39*  BUN 22* 20 21* 20 19 34*  CREATININE 0.70 0.74 0.80 0.81 0.79 1.11  CALCIUM 5.5* 6.0* 6.3* 6.1* 6.0* 5.8*  MG 1.8 1.9 1.7 1.7 1.8 1.9  PHOS 2.8  --   --   --  1.9*  --    GFR: Estimated Creatinine Clearance: 87 mL/min (by C-G formula based on SCr of 1.11 mg/dL). Liver Function Tests: Recent Labs  Lab 11/04/17 0850 11/09/17 0456  AST 34 123*  ALT 19 26  ALKPHOS 162* 226*  BILITOT 0.7 1.3*  PROT 4.0* 3.7*  ALBUMIN <1.0* <1.0*   No results for input(s): LIPASE, AMYLASE in the last 168 hours. No results for input(s): AMMONIA in the last 168 hours. Coagulation Profile: No results for input(s): INR, PROTIME in the last 168 hours. Cardiac Enzymes: No results for input(s): CKTOTAL, CKMB, CKMBINDEX, TROPONINI in the last 168 hours. BNP (last 3 results) No results for input(s): PROBNP in the last 8760 hours. HbA1C: No results for input(s): HGBA1C in the last 72 hours. CBG: Recent Labs  Lab 11/09/17 2353 11/10/17 0612 11/10/17 1559 11/10/17 2104 11/11/17 0558  GLUCAP 130* 130* 74 63* 90   Lipid Profile: Recent Labs    11/10/17 0749  TRIG 130   Thyroid Function Tests: No results for input(s): TSH, T4TOTAL, FREET4, T3FREE, THYROIDAB in the  last 72 hours. Anemia Panel: No results for input(s): VITAMINB12, FOLATE, FERRITIN, TIBC, IRON, RETICCTPCT in the last 72 hours.    Radiology Studies: I have reviewed all of the imaging during this hospital visit personally     Scheduled Meds: . baclofen  5 mg Oral TID  . emtricitabine-tenofovir  1 tablet Oral Daily   And  . dolutegravir  50 mg Oral Daily  . feeding supplement (ENSURE ENLIVE)  237 mL Oral TID BM  . magnesium oxide  400 mg Oral TID  . multivitamin  5 mL Oral Daily  . sodium chloride flush  10-40 mL Intracatheter Q12H  . sodium chloride  flush  10-40 mL Intracatheter Q12H  . sodium chloride flush  3 mL Intravenous Q12H  . sulfamethoxazole-trimethoprim  20 mL Oral Daily   Continuous Infusions: . sodium chloride 250 mL (11/10/17 1531)  . calcium gluconate    . dextrose 5% lactated ringers    . levofloxacin (LEVAQUIN) IV Stopped (11/10/17 1652)     LOS: 19 days        Anthony Purpura Annett Gulaaniel Paislynn Hegstrom, MD [facility] Pager [phone]

## 2017-11-12 DIAGNOSIS — Z7189 Other specified counseling: Secondary | ICD-10-CM

## 2017-11-12 DIAGNOSIS — R7989 Other specified abnormal findings of blood chemistry: Secondary | ICD-10-CM | POA: Diagnosis present

## 2017-11-12 DIAGNOSIS — Z515 Encounter for palliative care: Secondary | ICD-10-CM

## 2017-11-12 DIAGNOSIS — R945 Abnormal results of liver function studies: Secondary | ICD-10-CM

## 2017-11-12 LAB — CBC WITH DIFFERENTIAL/PLATELET
BASOS ABS: 0 10*3/uL (ref 0.0–0.1)
BLASTS: 0 %
Band Neutrophils: 4 %
Basophils Relative: 0 %
Eosinophils Absolute: 0.3 10*3/uL (ref 0.0–0.7)
Eosinophils Relative: 2 %
HCT: 16.7 % — ABNORMAL LOW (ref 39.0–52.0)
HEMOGLOBIN: 5.7 g/dL — AB (ref 13.0–17.0)
LYMPHS PCT: 44 %
Lymphs Abs: 5.5 10*3/uL — ABNORMAL HIGH (ref 0.7–4.0)
MCH: 27.7 pg (ref 26.0–34.0)
MCHC: 34.1 g/dL (ref 30.0–36.0)
MCV: 81.1 fL (ref 78.0–100.0)
METAMYELOCYTES PCT: 0 %
MONOS PCT: 4 %
Monocytes Absolute: 0.5 10*3/uL (ref 0.1–1.0)
Myelocytes: 0 %
NEUTROS ABS: 6.3 10*3/uL (ref 1.7–7.7)
Neutrophils Relative %: 46 %
OTHER: 0 %
PROMYELOCYTES ABS: 0 %
Platelets: 12 10*3/uL — CL (ref 150–400)
RBC: 2.06 MIL/uL — AB (ref 4.22–5.81)
RDW: 18.3 % — AB (ref 11.5–15.5)
WBC: 12.6 10*3/uL — AB (ref 4.0–10.5)
nRBC: 0 /100 WBC

## 2017-11-12 LAB — BASIC METABOLIC PANEL
ANION GAP: 8 (ref 5–15)
BUN: 42 mg/dL — ABNORMAL HIGH (ref 6–20)
CALCIUM: 5.6 mg/dL — AB (ref 8.9–10.3)
CO2: 16 mmol/L — ABNORMAL LOW (ref 22–32)
Chloride: 109 mmol/L (ref 101–111)
Creatinine, Ser: 1.41 mg/dL — ABNORMAL HIGH (ref 0.61–1.24)
GFR calc Af Amer: >60 mL/min (ref >60)
Glucose, Bld: 241 mg/dL — ABNORMAL HIGH (ref 65–99)
POTASSIUM: 3.4 mmol/L — AB (ref 3.5–5.1)
SODIUM: 133 mmol/L — AB (ref 135–145)

## 2017-11-12 LAB — PREPARE RBC (CROSSMATCH)

## 2017-11-12 LAB — GLUCOSE, CAPILLARY
GLUCOSE-CAPILLARY: 76 mg/dL (ref 65–99)
Glucose-Capillary: 76 mg/dL (ref 65–99)
Glucose-Capillary: 81 mg/dL (ref 65–99)

## 2017-11-12 LAB — MAGNESIUM: MAGNESIUM: 2 mg/dL (ref 1.7–2.4)

## 2017-11-12 MED ORDER — VANCOMYCIN HCL IN DEXTROSE 750-5 MG/150ML-% IV SOLN
750.0000 mg | Freq: Two times a day (BID) | INTRAVENOUS | Status: DC
  Filled 2017-11-12: qty 150

## 2017-11-12 MED ORDER — LORAZEPAM 2 MG/ML IJ SOLN
0.5000 mg | Freq: Once | INTRAMUSCULAR | Status: AC
  Administered 2017-11-12: 0.5 mg via INTRAVENOUS
  Filled 2017-11-12: qty 1

## 2017-11-12 MED ORDER — MORPHINE SULFATE (PF) 2 MG/ML IV SOLN
2.0000 mg | INTRAVENOUS | Status: DC | PRN
  Administered 2017-11-12: 2 mg via INTRAVENOUS
  Filled 2017-11-12 (×2): qty 1

## 2017-11-12 MED ORDER — SODIUM CHLORIDE 0.9 % IV SOLN
Freq: Once | INTRAVENOUS | Status: AC
  Administered 2017-11-12: 07:00:00 via INTRAVENOUS

## 2017-11-12 MED ORDER — POTASSIUM CHLORIDE 10 MEQ/50ML IV SOLN
10.0000 meq | INTRAVENOUS | Status: AC
  Administered 2017-11-12 (×3): 10 meq via INTRAVENOUS
  Filled 2017-11-12 (×3): qty 50

## 2017-11-12 MED ORDER — POTASSIUM CHLORIDE 10 MEQ/50ML IV SOLN
10.0000 meq | INTRAVENOUS | Status: AC
  Filled 2017-11-12 (×3): qty 50

## 2017-11-12 MED ORDER — SODIUM BICARBONATE 8.4 % IV SOLN
INTRAVENOUS | Status: DC
  Administered 2017-11-12 – 2017-11-15 (×4): via INTRAVENOUS
  Filled 2017-11-12 (×7): qty 850

## 2017-11-12 NOTE — Progress Notes (Signed)
PT Cancellation Note  Patient Details Name: Anthony Yang MRN: [id number] DOB: 18-Feb-1986   Cancelled Treatment:    Reason Eval/Treat Not Completed: (P) Medical issues which prohibited therapy(Pt's Hemoglobin is low at 5.7. Cancel treatment for today.)  Sharlene MottsRachel Dina Warbington, SPTA  Sharlene MottsRachel Lakeyta Vandenheuvel 11/12/2017, 1:42 PM

## 2017-11-12 NOTE — Progress Notes (Signed)
CRITICAL VALUE ALERT  Critical Value:  HGB 5.7  Calcium 5.6  Date & Time Notied:  11/12/2017 0608  Provider Notified: Craige CottaKirby  Orders Received/Actions taken: Waiting for response

## 2017-11-12 NOTE — Progress Notes (Addendum)
Pt has had no urine output today, condom cath intact.  Bladder scan showed >450cc.  MD paged for further orders.  1810 - pt voided 400cc s/p condom cath removal.  Will not place foley as ordered by Dr. Darnelle Catalanama, she has been paged and made aware.

## 2017-11-12 NOTE — Consult Note (Signed)
Consultation Note Date: 11/12/2017   Patient Name: Anthony Yang  DOB: 01/21/1986  MRN: [id number]  Age / Sex: 31 y.o., male  PCP: Patient, No Pcp Per Referring Physician: Rama, Venetia Maxon, MD  Reason for Consultation: Establishing goals of care  HPI/Patient Profile: 31 y.o. male  with past medical history of HIV/AIDs (CD4 58) diagnosed 2013 but off ART for 2 years until 10/01/2017, recently treated for Giardia, suspected MAI, severe anemia during admission 9/27-10/4/18 and now admitted on 10/18/2017 with severe dehydration, hyponatremia, hypokalemia. He has experienced severe weight loss, malnutrition and weakness. He has continued to decline this hospitalization and unable to tolerate intake. Hospitalization also complicated by aspiration pneumonia sepsis. Palliative care requested to assist with Anthony Yang given poor prognosis and disposition.   Clinical Assessment and Goals of Care: I met today and spent some time with both mother and father separately. They were both able to discuss life review of Anthony Yang and are proud of his ability for music (singing and plays 5 instruments), ministry, and was an avid bowler (family hobby). They are both in extreme grief and able to acknowledge, with difficulty, that their son is at EOL. Mother states "I am praying for a miracle." Father knows that it would truly take a miracle for his son to improve. Father was able to share that he has known for about a week that his son is declining to EOL. He says that he has shared with his wife "he's slipping away." They are a very spiritual family and rely heavily on their faith for strength during this time. They both share deep guilt and regret that there is more they could have done or that they should have known something was wrong and should have intervened. I attempted to assure them that this is not their fault and Anthony Yang was grown and  making his own decisions and that we don't always make good decisions for ourselves. I reassured them that they have supported and loved him and that everything else is out of their control.   My time today was spent listening, reassuring, and supporting Anthony Yang' parents as they are grieving. They both clearly know that he is dying at this time. I will follow up tomorrow for continued support and more discussion regarding aggressiveness of care desired at this time. Mother did share that they do not want to see him suffer - so comfort is priority.   Primary Decision Maker NEXT OF KIN Parents    SUMMARY OF RECOMMENDATIONS   - DNR has already been established - Will need further conversation tomorrow regarding aggressiveness of care and interventions desired vs more focus on comfort care  Code Status/Advance Care Planning:  DNR   Symptom Management:   Pain: Seemed to have good relief from morphine 2 mg IV every 2 hours prn. Received dose ~11am and appears to be resting comfortably still at 2pm.   Palliative Prophylaxis:   Aspiration, Frequent Pain Assessment, Oral Care and Turn Reposition  Psycho-social/Spiritual:   Desire for further Chaplaincy support:yes  Additional Recommendations: Education on Hospice and Grief/Bereavement Support  Prognosis:   Prognosis very poor and could be days.   Discharge Planning: To Be Determined      Primary Diagnoses: Present on Admission: . AIDS (acquired immune deficiency syndrome) ([place]) . Protein-calorie malnutrition, severe . (Resolved) Diarrhea . Dehydration . (Resolved) Microcytic anemia   I have reviewed the medical record, interviewed the patient and family, and examined the patient. The following aspects are pertinent.  Past Medical History:  Diagnosis Date  . AIDS (acquired immune deficiency syndrome) ([place])    hx/notes 09/25/2017  . Anemia   . Anemia in other chronic diseases classified elsewhere 11/02/2017  . History of  blood transfusion 09/25/2017   "anemic"  . HIV (human immunodeficiency virus infection) ([place])    hx/notes 09/24/2017   Social History   Socioeconomic History  . Marital status: Single    Spouse name: None  . Number of children: None  . Years of education: None  . Highest education level: None  Social Needs  . Financial resource strain: None  . Food insecurity - worry: None  . Food insecurity - inability: None  . Transportation needs - medical: None  . Transportation needs - non-medical: None  Occupational History  . None  Tobacco Use  . Smoking status: Never Smoker  . Smokeless tobacco: Never Used  Substance and Sexual Activity  . Alcohol use: Yes    Alcohol/week: 1.8 oz    Types: 3 Shots of liquor per week  . Drug use: No  . Sexual activity: No  Other Topics Concern  . None  Social History Narrative  . None   No family history on file. Scheduled Meds: . baclofen  5 mg Oral TID  . emtricitabine-tenofovir  1 tablet Oral Daily   And  . dolutegravir  50 mg Oral Daily  . feeding supplement (ENSURE ENLIVE)  237 mL Oral TID BM  . magnesium oxide  400 mg Oral TID  . multivitamin  5 mL Oral Daily  . sodium chloride flush  10-40 mL Intracatheter Q12H  . sodium chloride flush  10-40 mL Intracatheter Q12H  . sodium chloride flush  3 mL Intravenous Q12H  . sulfamethoxazole-trimethoprim  20 mL Oral Daily   Continuous Infusions: . sodium chloride 250 mL (11/10/17 1531)  . dextrose 5% lactated ringers 100 mL/hr at 11/11/17 2315  . levofloxacin (LEVAQUIN) IV Stopped (11/11/17 1648)  . potassium chloride     PRN Meds:.sodium chloride, acetaminophen **OR** acetaminophen, albuterol, Gerhardt's butt cream, loperamide, morphine injection, ondansetron (ZOFRAN) IV, phenol, prochlorperazine, RESOURCE THICKENUP CLEAR, simethicone, sodium chloride flush, sodium chloride flush, sodium chloride flush, traZODone Allergies  Allergen Reactions  . Penicillins Hives    Has patient had a PCN  reaction causing immediate rash, facial/tongue/throat swelling, SOB or lightheadedness with hypotension: Yes Has patient had a PCN reaction causing severe rash involving mucus membranes or skin necrosis: No Has patient had a PCN reaction that required hospitalization: No Has patient had a PCN reaction occurring within the last 10 years: No If all of the above answers are "NO", then may proceed with Cephalosporin use.   Review of Systems  Unable to perform ROS: Acuity of condition    Physical Exam  Constitutional: He appears lethargic. He appears cachectic. He has a sickly appearance.  Cardiovascular: Regular rhythm. Tachycardia present.  Pulmonary/Chest: Effort normal. No accessory muscle usage. No tachypnea. No respiratory distress.  Abdominal: Normal appearance.  Neurological: He appears lethargic.  Sleeping. Unable to participate in  conversation. I did not attempt to awaken.     Vital Signs: BP (!) 87/43   Pulse (!) 111   Temp (!) 97.5 F (36.4 C) (Axillary)   Resp (!) 26   Ht 5' 6"  (1.676 m)   Wt 65.5 kg (144 lb 6.4 oz)   SpO2 100%   BMI 23.31 kg/m  Pain Assessment: No/denies pain POSS *See Group Information*: 1-Acceptable,Awake and alert Pain Score: Asleep   SpO2: SpO2: 100 % O2 Device:SpO2: 100 % O2 Flow Rate: .O2 Flow Rate (L/min): 4 L/min  IO: Intake/output summary:   Intake/Output Summary (Last 24 hours) at 11/12/2017 1248 Last data filed at 11/12/2017 1101 Gross per 24 hour  Intake 700 ml  Output 225 ml  Net 475 ml    LBM: Last BM Date: 11/11/17 Baseline Weight: Weight: 53.1 kg (117 lb) Most recent weight: Weight: 65.5 kg (144 lb 6.4 oz)     Palliative Assessment/Data: 20%     Time Total: 80 min  Greater than 50%  of this time was spent counseling and coordinating care related to the above assessment and plan.  Signed by: Vinie Sill, NP Palliative Medicine Team Pager # [phone] (M-F 8a-5p) Team Phone # [phone] (Nights/Weekends)

## 2017-11-12 NOTE — Progress Notes (Signed)
Clinical Social Worker following patient and family for support. At this time patient is to medically unstable for discharge and disposition  plan at this time is unknown.   Marrianne MoodAshley Joella Saefong, MSW,  [employer]CSWA [phone]

## 2017-11-12 NOTE — Progress Notes (Signed)
[facility]  Date of Admission:  12-29-17     Total days of antibiotics 3  Levofloxacin 11/12 >> Vancomycin 11/13 >> 11/14         ASSESSMENT/PLAN  Pneumonia - Likely the result of aspiration. Afebrile. Continue with levofloxacin. Unlikely to need MRSA coverage and to avoid further nephrotoxicity will discontinue vancomycin.  HIV-AIDS - Continue anti-retrovirals.    Acute on chronic anemia and thrombocytopenia - Receiving 2 units of blood today. Additional per primary.  Diarrhea - Continues to have decreased diarrhea. Recommend continued symptom and supportive management.   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 baclofen  5 mg Oral TID  . emtricitabine-tenofovir  1 tablet Oral Daily   And  . dolutegravir  50 mg Oral Daily  . feeding supplement (ENSURE ENLIVE)  237 mL Oral TID BM  . magnesium oxide  400 mg Oral TID  . multivitamin  5 mL Oral Daily  . sodium chloride flush  10-40 mL Intracatheter Q12H  . sodium chloride flush  10-40 mL Intracatheter Q12H  . sodium chloride flush  3 mL Intravenous Q12H  . sulfamethoxazole-trimethoprim  20 mL Oral Daily    SUBJECTIVE: Sleeping. Mother indicates that he did not rest well last night. Currently receiving blood. TNA is on hold. Afebrile overnight.   Allergies  Allergen Reactions  . Penicillins Hives    Has patient had a PCN reaction causing immediate rash, facial/tongue/throat swelling, SOB or lightheadedness with hypotension: Yes Has patient had a PCN reaction causing severe rash involving mucus membranes or skin necrosis: No Has patient had a PCN reaction that required hospitalization: No Has  patient had a PCN reaction occurring within the last 10 years: No If all of the above answers are "NO", then may proceed with Cephalosporin use.     Review of Systems: Review of Systems  Unable to perform ROS: Acuity of condition      OBJECTIVE: Vitals:   11/12/17 1125 11/12/17 1405 11/12/17 1521 11/12/17 1536  BP: (!) 87/43 (!) 87/49 (!) 88/53 (!) 87/57  Pulse: (!) 111 (!) 110 (!) 112 (!) 110  Resp: (!) 26 (!) 22 (!) 23 (!) 21  Temp: (!) 97.5 F (36.4 C) 97.9 F (36.6 C) 98.7 F (37.1 C) 98.3 F (36.8 C)  TempSrc: Axillary Axillary Axillary Axillary  SpO2: 100% 100% 100% 100%  Weight:      Height:       Body mass index is 23.31 kg/m.  Physical Exam  Constitutional: He appears malnourished. He appears cachectic. He has a sickly appearance. No distress.  Cardiovascular: Regular rhythm and normal heart sounds. Tachycardia present. Exam reveals no gallop and no friction rub.  No murmur heard. Pulmonary/Chest: Effort normal. Tachypnea noted. He has wheezes. He has rales.  Abdominal: Soft. Bowel sounds are normal. He exhibits no distension.  Skin: Skin is dry. There is pallor.  Cool to the touch    Lab Results Lab Results  Component Value Date   WBC 12.6 (H) 11/12/2017   HGB 5.7 (LL) 11/12/2017   HCT 16.7 (L) 11/12/2017   MCV 81.1 11/12/2017   PLT 12 (LL) 11/12/2017    Lab Results  Component Value Date  CREATININE 1.41 (H) 11/12/2017   BUN 42 (H) 11/12/2017   NA 133 (L) 11/12/2017   K 3.4 (L) 11/12/2017   CL 109 11/12/2017   CO2 16 (L) 11/12/2017    Lab Results  Component Value Date   ALT 26 11/09/2017   AST 123 (H) 11/09/2017   ALKPHOS 226 (H) 11/09/2017   BILITOT 1.3 (H) 11/09/2017     Microbiology: Recent Results (from the past 240 hour(s))  Culture, blood (routine x 2)     Status: None (Preliminary result)   Collection Time: 11/11/17  9:39 AM  Result Value Ref Range Status   Specimen Description BLOOD LEFT HAND  Final   Special Requests IN  PEDIATRIC BOTTLE Blood Culture adequate volume  Final   Culture NO GROWTH 1 DAY  Final   Report Status PENDING  Incomplete  Culture, blood (routine x 2)     Status: None (Preliminary result)   Collection Time: 11/11/17 10:00 AM  Result Value Ref Range Status   Specimen Description BLOOD LEFT HAND  Final   Special Requests IN PEDIATRIC BOTTLE Blood Culture adequate volume  Final   Culture NO GROWTH 1 DAY  Final   Report Status PENDING  Incomplete     Marcos EkeGreg Lavren Lewan, NP [facility] [phone] Pager  11/12/2017  3:45 PM

## 2017-11-12 NOTE — Progress Notes (Signed)
SLP Cancellation Note  Patient Details Name: Anthony Yang MRN: [id number] DOB: [birth year]   Cancelled treatment:        NSG cleaning pt. Will continue efforts.   [name], Anthony Yang 11/12/2017, 1:55 PM   Breck CoonsLisa Yang Lonell FaceLitaker M.Ed [employer] Pager [phone]

## 2017-11-12 NOTE — Progress Notes (Addendum)
Progress Note    Anthony FriendsMarcus L Bouler  ZOX:[id number] DOB: [birth year]  DOA: 10/11/2017 PCP: Patient, No Pcp Per    Brief Narrative:   Chief complaint: Follow-up aspiration pneumonia  Medical records reviewed and are as summarized below:  Anthony Yang is an 31 y.o. male with a PMH of HIV/AIDS, last CD4 count 28, who was admitted 10/02/2017 from the ID clinic for treatment of persistent diarrhea complicated by dehydration with associated 50 pound weight loss over 6 months in the setting of recent treatment for Giardia. Upon presentation, he had significant electrolyte abnormalities including hyponatremia and hypokalemia. He has had a prolonged hospital stay due to generalized failure to thrive and poor nutritional status, requiring treatment with TPN. Hospital course further complicated by the development of aspiration pneumonia/sepsis. Family at this point has agreed to DO NOT RESUSCITATE status. Poor prognosis discussed with family and palliative care has been consulted for goals of care.  Assessment/Plan:   Principal Problem: Sepsis due to aspiration pneumonia, not present on admission/dysphasia Currently being treated with Levaquin/vancomycin, stress dose steroids. ID recommended discontinuing vancomycin which I agree with. Overall prognosis remains poor given severe immune suppression. Patient's family agrees for bipap if needed but no invasive mechanical ventilation. Chest films show infiltrates on the right lower lobe and left upper lobe. Evaluated by speech therapist and placed on a dysphagia 2 diet with nectar thickened liquids  Active problems:  Elevated LFTs No history of viral hepatitis. Liver appeared normal in size and contour on CT done 11/06/17, but there was periportal edema and possible mild sludge within the gallbladder lumen. Pancreas was unremarkable.  Hypovolemia due to significant GI losses He is third spacing and extremely malnourished. Continue gentle IV fluids as  tolerated.  Acute on chronic anemia plus thrombocytopenia Continues with worsening anemia, and thrombocytopenia. There are no signs of hemolysis and fibrinogen is not low, not suggestive of DIC. Likely reactive, secondary to sepsis. Very poor prognosis.  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 Hemoglobin 6.3 today. 2 units of PRBCs ordered.  AKI with hyponatremia/hypokalemia/ hypocalcemia, with non gap metabolic alkalosis Renal function continues to deteriorate. He has metabolic acidosis, hyponatremia, hypokalemia and hypocalcemia. Electrolyte derangements are likely due to diarrhea. Replace potassium. Change IV fluids to fluids containing sodium bicarbonate.  Acute or chronic diarrhea Clinically had decreased diarrhea, will continue supportive medical therapy.   HIV-AIDS Continue anti retroviral therapy (Truvada, Tivicay, Biktarvy), per ID recommendations.Given overall poor prognosis, palliative care consultation has been requested. Continue Bactrim/Azithro for prophylaxis.  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 Has been on TPN.   Family Communication/Anticipated D/C date and plan/Code Status   DVT prophylaxis: Anticoagulation is contraindicated given significant thrombocytopenia. Code Status: DO NOT RESUSCITATE.  Family Communication: Parents updated bedside. Disposition Plan: Awaiting palliative care recommendations for disposition.  Medical Consultants:    Palliative Care  Critical Care  Gastroenterology  Infectious Disease   Anti-Infectives:    Vancomycin 11/11/17---> 11/12/17  Levaquin 11/10/17--->  Albenza 10/28/17--->   Subjective:   Weak, complains of abdominal pain, still with some diarrhea. Poor PO intake.   Objective:    Vitals:   11/11/17 2200 11/12/17 0215 11/12/17 0400 11/12/17 0500  BP:  (!) 80/39 (!) 82/54   Pulse: (!) 114 (!) 109 (!) 113 (!) 117  Resp: 19 (!) 23 (!) 22 15  Temp:      TempSrc:        SpO2: 100% 100% 100% 98%  Weight:  65.5 kg (144 lb 6.4 oz)  Height:        Intake/Output Summary (Last 24 hours) at 11/12/2017 0849 Last data filed at 11/11/2017 2100 Gross per 24 hour  Intake 432.5 ml  Output 225 ml  Net 207.5 ml   Filed Weights   11/10/17 0500 11/11/17 0502 11/12/17 0500  Weight: 66.5 kg (146 lb 9.7 oz) 69 kg (152 lb 1.9 oz) 65.5 kg (144 lb 6.4 oz)    Exam: General: Cachectic, chronically ill-appearing. Cardiovascular: Tachycardic with a hyperdynamic precordium. No gallops or rubs. No murmurs. No JVD. Lungs: Clear to auscultation bilaterally with diminished air movement. No rales, rhonchi or wheezes. Abdomen: Soft, nontender, nondistended with normal active bowel sounds. No masses. +splenomegaly. Neurological: Weak and mildly lethargic. Skin: Warm and dry. No rashes or lesions. Extremities: No clubbing or cyanosis. 2+ edema. Pedal pulses 2+. Psychiatric: Mood and affect are depressed/flat. Insight and judgment are poor.   Data Reviewed:   I have personally reviewed following labs and imaging studies:  Labs: Labs show the following:   Basic Metabolic Panel: Recent Labs  Lab 11/07/17 0737 11/08/17 0432 11/09/17 0456 11/11/17 0410 11/12/17 0439  NA 133* 134* 134* 135 133*  K 3.9 3.5 3.4* 3.5 3.4*  CL 109 109 109 111 109  CO2 20* 22 21* 19* 16*  GLUCOSE 97 101* 130* 39* 241*  BUN 21* 20 19 34* 42*  CREATININE 0.80 0.81 0.79 1.11 1.41*  CALCIUM 6.3* 6.1* 6.0* 5.8* 5.6*  MG 1.7 1.7 1.8 1.9 2.0  PHOS  --   --  1.9*  --   --    GFR Estimated Creatinine Clearance: 68.5 mL/min (A) (by C-G formula based on SCr of 1.41 mg/dL (H)). Liver Function Tests: Recent Labs  Lab 11/09/17 0456  AST 123*  ALT 26  ALKPHOS 226*  BILITOT 1.3*  PROT 3.7*  ALBUMIN <1.0*   CBC: Recent Labs  Lab 11/07/17 0737 11/08/17 0432 11/10/17 0749 11/11/17 0410 11/12/17 0439  WBC 3.6* 5.6 13.7* 14.2* 12.6*  NEUTROABS 2.2 2.2 5.6 8.0* 6.3  HGB 6.7* 9.3* 7.9*  7.0* 5.7*  HCT 19.3* 27.0* 22.8* 20.1* 16.7*  MCV 85.8 83.3 85.1 82.0 81.1  PLT 34* 34* 28* 16* 12*   CBG: Recent Labs  Lab 11/11/17 1328 11/11/17 1625 11/11/17 2006 11/12/17 0012 11/12/17 0603  GLUCAP 85 83 99 81 76   Lipid Profile: Recent Labs    11/10/17 0749  TRIG 130    Microbiology No results found for this or any previous visit (from the past 240 hour(s)).  Procedures and diagnostic studies:  11/11/17: Dg Chest Port 1 View:  Persistent densities in the mid and lower left chest. Findings are most compatible with pleural fluid and consolidation. Minimal change from the recent comparison examination. Perihilar densities, left side greater than right. Perihilar densities are nonspecific.   11/11/17: Dg Swallowing Func-speech Pathology: Diet Recommendations Dysphagia 2 (Fine chop) solids;Nectar thick liquid.  11/10/17: Dg Chest Port 1 View:  Extensive left lower lobe airspace consolidation with left pleural effusion. Widespread pulmonary edema appearance. Suspect widespread interstitial pneumonitis, likely of infectious etiology. Edema due to cardiac failure is possible but felt to be less likely given appearance of the cardiac silhouette.     Medications:   . baclofen  5 mg Oral TID  . emtricitabine-tenofovir  1 tablet Oral Daily   And  . dolutegravir  50 mg Oral Daily  . feeding supplement (ENSURE ENLIVE)  237 mL Oral TID BM  .  magnesium oxide  400 mg Oral TID  . multivitamin  5 mL Oral Daily  . sodium chloride flush  10-40 mL Intracatheter Q12H  . sodium chloride flush  10-40 mL Intracatheter Q12H  . sodium chloride flush  3 mL Intravenous Q12H  . sulfamethoxazole-trimethoprim  20 mL Oral Daily   Continuous Infusions: . sodium chloride 250 mL (11/10/17 1531)  . dextrose 5% lactated ringers 100 mL/hr at 11/11/17 2315  . levofloxacin (LEVAQUIN) IV Stopped (11/11/17 1648)  . vancomycin       LOS: 20 days   Abhay Godbolt  [facility] Pager [phone]. If unable to reach me by pager, please call my cell phone at [phone].  *Please refer to [email or web address], password [email or web address] to get updated schedule on who will round on this patient, as hospitalists switch teams weekly. If 7PM-7AM, please contact night-coverage at [email or web address], password [email or web address] for any overnight needs.  11/12/2017, 8:49 AM

## 2017-11-13 DIAGNOSIS — R338 Other retention of urine: Secondary | ICD-10-CM | POA: Diagnosis not present

## 2017-11-13 DIAGNOSIS — R945 Abnormal results of liver function studies: Secondary | ICD-10-CM

## 2017-11-13 DIAGNOSIS — E162 Hypoglycemia, unspecified: Secondary | ICD-10-CM

## 2017-11-13 DIAGNOSIS — Z9889 Other specified postprocedural states: Secondary | ICD-10-CM

## 2017-11-13 DIAGNOSIS — J690 Pneumonitis due to inhalation of food and vomit: Secondary | ICD-10-CM

## 2017-11-13 DIAGNOSIS — R4182 Altered mental status, unspecified: Secondary | ICD-10-CM

## 2017-11-13 DIAGNOSIS — Z6824 Body mass index (BMI) 24.0-24.9, adult: Secondary | ICD-10-CM

## 2017-11-13 DIAGNOSIS — R64 Cachexia: Secondary | ICD-10-CM

## 2017-11-13 LAB — COMPREHENSIVE METABOLIC PANEL
ALK PHOS: 202 U/L — AB (ref 38–126)
ALT: 33 U/L (ref 17–63)
AST: 150 U/L — AB (ref 15–41)
Albumin: <1 g/dL — ABNORMAL LOW (ref 3.5–5.0)
Anion gap: 8 (ref 5–15)
BILIRUBIN TOTAL: 4.3 mg/dL — AB (ref 0.3–1.2)
BUN: 51 mg/dL — AB (ref 6–20)
CALCIUM: 5.4 mg/dL — AB (ref 8.9–10.3)
CHLORIDE: 111 mmol/L (ref 101–111)
CO2: 17 mmol/L — ABNORMAL LOW (ref 22–32)
CREATININE: 1.63 mg/dL — AB (ref 0.61–1.24)
GFR calc Af Amer: >60 mL/min (ref >60)
GFR, EST NON AFRICAN AMERICAN: 55 mL/min — AB (ref >60)
Glucose, Bld: 65 mg/dL (ref 65–99)
Potassium: 3.9 mmol/L (ref 3.5–5.1)
Sodium: 136 mmol/L (ref 135–145)
Total Protein: 3.6 g/dL — ABNORMAL LOW (ref 6.5–8.1)

## 2017-11-13 LAB — TYPE AND SCREEN
ABO/RH(D): B POS
Antibody Screen: NEGATIVE
Unit division: 0
Unit division: 0

## 2017-11-13 LAB — BPAM RBC
Blood Product Expiration Date: 201811232359
Blood Product Expiration Date: 201811262359
ISSUE DATE / TIME: 201811141044
ISSUE DATE / TIME: 201811141509
Unit Type and Rh: 1700
Unit Type and Rh: 1700

## 2017-11-13 LAB — CBC
HCT: 24.8 % — ABNORMAL LOW (ref 39.0–52.0)
Hemoglobin: 8.6 g/dL — ABNORMAL LOW (ref 13.0–17.0)
MCH: 28.4 pg (ref 26.0–34.0)
MCHC: 34.7 g/dL (ref 30.0–36.0)
MCV: 81.8 fL (ref 78.0–100.0)
PLATELETS: 11 10*3/uL — AB (ref 150–400)
RBC: 3.03 MIL/uL — ABNORMAL LOW (ref 4.22–5.81)
RDW: 16.8 % — AB (ref 11.5–15.5)
WBC: 16.4 10*3/uL — AB (ref 4.0–10.5)

## 2017-11-13 LAB — GLUCOSE, CAPILLARY
GLUCOSE-CAPILLARY: 51 mg/dL — AB (ref 65–99)
GLUCOSE-CAPILLARY: 75 mg/dL (ref 65–99)
GLUCOSE-CAPILLARY: 66 mg/dL (ref 65–99)
GLUCOSE-CAPILLARY: 89 mg/dL (ref 65–99)
GLUCOSE-CAPILLARY: 86 mg/dL (ref 65–99)
Glucose-Capillary: 102 mg/dL — ABNORMAL HIGH (ref 65–99)
Glucose-Capillary: 110 mg/dL — ABNORMAL HIGH (ref 65–99)
Glucose-Capillary: 39 mg/dL — CL (ref 65–99)
Glucose-Capillary: 59 mg/dL — ABNORMAL LOW (ref 65–99)
Glucose-Capillary: 59 mg/dL — ABNORMAL LOW (ref 65–99)
Glucose-Capillary: 68 mg/dL (ref 65–99)
Glucose-Capillary: 77 mg/dL (ref 65–99)
Glucose-Capillary: 92 mg/dL (ref 65–99)

## 2017-11-13 LAB — MAGNESIUM: MAGNESIUM: 1.8 mg/dL (ref 1.7–2.4)

## 2017-11-13 MED ORDER — DEXTROSE 10 % IV SOLN
INTRAVENOUS | Status: DC
  Administered 2017-11-13 (×3): via INTRAVENOUS

## 2017-11-13 MED ORDER — EMTRICITABINE-TENOFOVIR AF 200-25 MG PO TABS
1.0000 | ORAL_TABLET | Freq: Every day | ORAL | Status: DC
  Administered 2017-11-13: 1 via ORAL
  Filled 2017-11-13 (×3): qty 1

## 2017-11-13 MED ORDER — METRONIDAZOLE IN NACL 5-0.79 MG/ML-% IV SOLN
500.0000 mg | Freq: Three times a day (TID) | INTRAVENOUS | Status: DC
  Administered 2017-11-13 – 2017-11-15 (×6): 500 mg via INTRAVENOUS
  Filled 2017-11-13 (×6): qty 100

## 2017-11-13 MED ORDER — TAMSULOSIN HCL 0.4 MG PO CAPS
0.4000 mg | ORAL_CAPSULE | Freq: Every day | ORAL | Status: DC
  Administered 2017-11-13: 0.4 mg via ORAL
  Filled 2017-11-13: qty 1

## 2017-11-13 MED ORDER — DEXTROSE 50 % IV SOLN
50.0000 mL | Freq: Once | INTRAVENOUS | Status: AC
  Administered 2017-11-13: 50 mL via INTRAVENOUS

## 2017-11-13 MED ORDER — DEXTROSE 50 % IV SOLN
INTRAVENOUS | Status: AC
  Administered 2017-11-13: 50 mL
  Filled 2017-11-13: qty 50

## 2017-11-13 MED ORDER — LIDOCAINE HCL 4 % EX SOLN
Freq: Once | CUTANEOUS | Status: DC
  Filled 2017-11-13: qty 50

## 2017-11-13 MED ORDER — LIDOCAINE HCL 2 % EX GEL
1.0000 "application " | Freq: Once | CUTANEOUS | Status: AC
  Administered 2017-11-13: 1 via TOPICAL
  Filled 2017-11-13: qty 5

## 2017-11-13 MED ORDER — DEXTROSE 5 % IV SOLN
1.0000 g | INTRAVENOUS | Status: DC
  Administered 2017-11-13 – 2017-11-14 (×2): 1 g via INTRAVENOUS
  Filled 2017-11-13 (×2): qty 10

## 2017-11-13 MED ORDER — DEXTROSE 50 % IV SOLN
INTRAVENOUS | Status: AC
  Filled 2017-11-13: qty 50

## 2017-11-13 MED ORDER — DEXTROSE-NACL 5-0.9 % IV SOLN: INTRAVENOUS | Status: DC

## 2017-11-13 MED ORDER — FENTANYL CITRATE (PF) 100 MCG/2ML IJ SOLN
25.0000 ug | INTRAMUSCULAR | Status: DC | PRN
  Administered 2017-11-13 – 2017-11-14 (×6): 25 ug via INTRAVENOUS
  Filled 2017-11-13 (×7): qty 2

## 2017-11-13 NOTE — Progress Notes (Signed)
Physical Therapy Discharge Patient Details Name: Anthony Yang MRN: [id number] DOB: 01/11/1986 Today's Date: 11/13/2017 Time:  -     Patient discharged from PT services secondary to medical decline - will need to re-order PT to resume therapy services.  Please see latest therapy progress note for current level of functioning and progress toward goals.    Progress and discharge plan discussed with nursing.  GP     Anthony Yang, PTA pager [phone]  11/13/2017, 1:09 PM

## 2017-11-13 NOTE — Progress Notes (Signed)
PT Cancellation Note  Patient Details Name: Anthony Yang MRN: [id number] DOB: 4Verlan Friends/13/1987   Cancelled Treatment:    Reason Eval/Treat Not Completed: (P) Medical issues which prohibited therapy(Pt remains will multiple medical complications and presenting with FTT have not been able to progress therapy since evaluation on the 7th.  Pallative care consult in place.  Will inform supervising PT to signoff at this time.) Please re-order when patient is medically appropriate.  Katee Wentland Artis DelayJ Shakeena Kafer 11/13/2017, 1:08 PM  Joycelyn RuaAimee Dosha Broshears, PTA pager [phone]

## 2017-11-13 NOTE — Progress Notes (Signed)
  Speech Language Pathology Treatment: Dysphagia  Patient Details Name: Anthony Yang MRN: [id number] DOB: [birth year] Today's Date: 11/13/2017 Time: 1320-1400 SLP Time Calculation (min) (ACUTE ONLY): 40 min  Assessment / Plan / Recommendation Clinical Impression  Pt was seen for skilled ST targeting family education and dysphagia goals.  Pt was laying in bed upon therapist's arrival, restless and perseverative on feeling bloated but refusing cath.  (Per family report, pt is retaining urine).  Pt was agreeable to oral care and sips of liquids but only took one sip of water before requesting to use bedpan.  Nurse tech assisted in putting pt on bedpan and therapist provided extensive education to pt's mother regarding likely prognosis as well as benefits and consequences of comfort feeds versus strict adherence to diet recommendations.  Pt's family and RN both report poor compliance with diet recommendations.  Mother reports understanding pt's risk of aspiration per MBS and wanting pt to be comfortable but then also appears to have unrealistic expectations regarding pt's prognosis and wants to do everything possible to get her son better.  Appreciate palliative care note and will await further recommendations regarding goals of care after meeting with family.  For now, will keep pt on caseload for additional opportunities to educate family and assist in developing goals of care if needed.     HPI HPI: 31 year old male who presents with generalized weakness. PMH: HIV/AIDS,anemia. Pt referred from the ID clinic due to persistent diarrhea and dehydration, 50 pound weight loss over last 6 months, recently treated for Giardia.Found to have hyponatremia/hypokalemia due to dehydration due to acute on chronic diarrhea,in the setting of immunosuppression. CT abdomen 10/29 revealed bilateral pleural effusions, bibasilar densities, predominantly involving the right lung base are concerning for an infectious  process; thickened appearance of the distal esophagus as well as diffusely thickened small bowel. CXR 11/12 Extensive left lower lobe airspace consolidation with left pleural effusion. Widespread pulmonary edema appearance. Suspect widespread interstitial pneumonitis, likely of infectious etiology. BSE 11/3 with aversion behaviors with suspected psychological in nature; aversion may be due to underlying GI issues.       SLP Plan  Other (Comment)(awaiting decision from family and palliative care)       Recommendations  Diet recommendations: Dysphagia 2 (fine chop);Nectar-thick liquid Liquids provided via: Cup Medication Administration: Other (Comment)(pt preference) Compensations: Minimize environmental distractions;Slow rate;Small sips/bites;Lingual sweep for clearance of pocketing;Multiple dry swallows after each bite/sip                Oral Care Recommendations: Oral care BID Follow up Recommendations: None SLP Visit Diagnosis: Dysphagia, oral phase (R13.11) Plan: Other (Comment)(awaiting decision from family and palliative care)       GO                Myah Guynes, Melanee SpryNicole L 11/13/2017, 2:06 PM

## 2017-11-13 NOTE — Progress Notes (Signed)
Progress Note    Anthony Yang  DGU:[id number] DOB: 1986/10/26  DOA: 10/27/2017 PCP: Patient, No Pcp Per    Brief Narrative:   Chief complaint: Follow-up aspiration pneumonia  Medical records reviewed and are as summarized below:  Anthony Yang is an 31 y.o. male with a PMH of HIV/AIDS, last CD4 count 28, who was admitted 10/29/2017 from the ID clinic for treatment of persistent diarrhea complicated by dehydration with associated 50 pound weight loss over 6 months in the setting of recent treatment for Giardia. Upon presentation, he had significant electrolyte abnormalities including hyponatremia and hypokalemia. He has had a prolonged hospital stay due to generalized failure to thrive and poor nutritional status, requiring treatment with TPN. Hospital course further complicated by the development of aspiration pneumonia/sepsis. Family at this point has agreed to DO NOT RESUSCITATE status. Poor prognosis discussed with family and palliative care has been consulted for goals of care.  Assessment/Plan:   Principal Problem: Sepsis due to aspiration pneumonia, not present on admission/dysphasia Initially treated with Levaquin/vancomycin, stress dose steroids. Vancomycin discontinued 11/12/17, and Levaquin stopped 11/13/17 secondary to hypoglycemia. Started on Rocephin/Flagyl as an alternative. Overall prognosis remains poor given severe immune suppression. Patient's family agrees for bipap if needed but no invasive mechanical ventilation. Chest films show infiltrates on the right lower lobe and left upper lobe. Evaluated by speech therapist and placed on a dysphagia 2 diet with nectar thickened liquids  Active problems:  Urinary retention Refused Foley catheter earlier today, mother adamant that she wants the Foley placed. Started Flomax. Will replace Foley if patient consents.  Hypoglycemia Multiple episodes overnight. Dextrose infusion increased. Stop Levaquin.  Elevated LFTs No  history of viral hepatitis. Liver appeared normal in size and contour on CT done 11/06/17, but there was periportal edema and possible mild sludge within the gallbladder lumen. Pancreas was unremarkable. AST/bilirubin are rising.  Hypovolemia due to significant GI losses He is third spacinging and is extremely malnourished. Continue IV fluids as tolerated.  Acute on chronic anemia plus thrombocytopenia Continues with worsening anemia, and thrombocytopenia. There are no signs of hemolysis and fibrinogen is not low, not suggestive of DIC. Likely reactive, secondary to sepsis. Very poor prognosis.  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 Hemoglobin 8.6 today, status post 2 units of PRBCs 11/12/17. Platelet count only 11. May need to get hematology involved if the family opts to continue to pursue aggressive treatment. Discussed case with ID with differential being HIV affecting bone marrow, or infection chest as parvovirus or MAC.  AKI with hyponatremia/hypokalemia/ hypocalcemia, with non gap metabolic alkalosis Renal function continues to deteriorate. Electrolyte derangements are likely due to diarrhea. Potassium replaced. Continue IV fluids with sodium bicarbonate.  Acute or chronic diarrhea RN reports ongoing frequent loose stools.   HIV-AIDS Continue anti retroviral therapy (Truvada, Tivicay, Biktarvy), per ID recommendations.Continue Bactrim/Azithro for prophylaxis. Given overall poor prognosis, palliative care was consulted and is having ongoing discussions with the family regarding goals of care.   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 Has been on TPN. Currently not on any supplemental nutritional support.   Family Communication/Anticipated D/C date and plan/Code Status   DVT prophylaxis: Anticoagulation is contraindicated given significant thrombocytopenia. Code Status: DO NOT RESUSCITATE.  Family Communication: Parents updated bedside. Disposition  Plan: Awaiting palliative care recommendations for disposition.  Medical Consultants:    Palliative Care  Critical Care  Gastroenterology  Infectious Disease   Anti-Infectives:    Vancomycin 11/11/17---> 11/12/17  Levaquin 11/10/17---> 11/13/17  Rocephin 11/13/17--->  Flagyl 11/13/17--->  Albenza 10/28/17--->10/29/17  Subjective:   Unable to urinate again this morning, refused foley cath placement. Still with generalized pain and abdominal pain.  No vomiting. 3 loose stools this morning.    Objective:    Vitals:   11/12/17 1810 11/12/17 2000 11/13/17 0031 11/13/17 0400  BP: (!) 83/53 (!) 83/55 (!) 86/47 91/62  Pulse: (!) 106 (!) 114 (!) 114 (!) 108  Resp: (!) 27 (!) 28 (!) 25 (!) 27  Temp: 98 F (36.7 C) 99.2 F (37.3 C) 97.7 F (36.5 C) (!) 97.4 F (36.3 C)  TempSrc: Axillary Oral Oral Axillary  SpO2: 98% 100% 97% 98%  Weight:    68.5 kg (151 lb 0.2 oz)  Height:        Intake/Output Summary (Last 24 hours) at 11/13/2017 0827 Last data filed at 11/13/2017 0300 Gross per 24 hour  Intake 1694.58 ml  Output 400 ml  Net 1294.58 ml   Filed Weights   11/11/17 0502 11/12/17 0500 11/13/17 0400  Weight: 69 kg (152 lb 1.9 oz) 65.5 kg (144 lb 6.4 oz) 68.5 kg (151 lb 0.2 oz)    Exam: (Unchanged from 11/12/17) General: Cachectic, chronically ill-appearing. Cardiovascular: Tachycardic with a hyperdynamic precordium. No gallops or rubs. No murmurs. No JVD. Lungs: Clear to auscultation bilaterally with diminished air movement. No rales, rhonchi or wheezes. Abdomen: Soft, nontender, nondistended with normal active bowel sounds. No masses. +splenomegaly. Skin: Warm and dry. No rashes or lesions. Extremities: No clubbing or cyanosis. 2+ edema. Pedal pulses 2+.  Data Reviewed:   I have personally reviewed following labs and imaging studies:  Labs: Labs show the following:   Basic Metabolic Panel: Recent Labs  Lab 11/08/17 0432 11/09/17 0456 11/11/17 0410  11/12/17 0439 11/13/17 0359  NA 134* 134* 135 133* 136  K 3.5 3.4* 3.5 3.4* 3.9  CL 109 109 111 109 111  CO2 22 21* 19* 16* 17*  GLUCOSE 101* 130* 39* 241* 65  BUN 20 19 34* 42* 51*  CREATININE 0.81 0.79 1.11 1.41* 1.63*  CALCIUM 6.1* 6.0* 5.8* 5.6* 5.4*  MG 1.7 1.8 1.9 2.0 1.8  PHOS  --  1.9*  --   --   --    GFR Estimated Creatinine Clearance: 59.3 mL/min (A) (by C-G formula based on SCr of 1.63 mg/dL (H)). Liver Function Tests: Recent Labs  Lab 11/09/17 0456 11/13/17 0359  AST 123* 150*  ALT 26 33  ALKPHOS 226* 202*  BILITOT 1.3* 4.3*  PROT 3.7* 3.6*  ALBUMIN <1.0* <1.0*   CBC: Recent Labs  Lab 11/07/17 0737 11/08/17 0432 11/10/17 0749 11/11/17 0410 11/12/17 0439 11/13/17 0359  WBC 3.6* 5.6 13.7* 14.2* 12.6* 16.4*  NEUTROABS 2.2 2.2 5.6 8.0* 6.3  --   HGB 6.7* 9.3* 7.9* 7.0* 5.7* 8.6*  HCT 19.3* 27.0* 22.8* 20.1* 16.7* 24.8*  MCV 85.8 83.3 85.1 82.0 81.1 81.8  PLT 34* 34* 28* 16* 12* 11*   CBG: Recent Labs  Lab 11/13/17 0233 11/13/17 0256 11/13/17 0410 11/13/17 0526 11/13/17 0637  GLUCAP 75 68 59* 66 59*   Lipid Profile: No results for input(s): CHOL, HDL, LDLCALC, TRIG, CHOLHDL, LDLDIRECT in the last 72 hours.  Microbiology Recent Results (from the past 240 hour(s))  Culture, blood (routine x 2)     Status: None (Preliminary result)   Collection Time: 11/11/17  9:39 AM  Result Value Ref Range Status   Specimen Description BLOOD LEFT HAND  Final  Special Requests IN PEDIATRIC BOTTLE Blood Culture adequate volume  Final   Culture NO GROWTH 1 DAY  Final   Report Status PENDING  Incomplete  Culture, blood (routine x 2)     Status: None (Preliminary result)   Collection Time: 11/11/17 10:00 AM  Result Value Ref Range Status   Specimen Description BLOOD LEFT HAND  Final   Special Requests IN PEDIATRIC BOTTLE Blood Culture adequate volume  Final   Culture NO GROWTH 1 DAY  Final   Report Status PENDING  Incomplete    Procedures and diagnostic  studies:  11/11/17: Dg Chest Port 1 View:  Persistent densities in the mid and lower left chest. Findings are most compatible with pleural fluid and consolidation. Minimal change from the recent comparison examination. Perihilar densities, left side greater than right. Perihilar densities are nonspecific.   11/11/17: Dg Swallowing Func-speech Pathology: Diet Recommendations Dysphagia 2 (Fine chop) solids;Nectar thick liquid.  11/10/17: Dg Chest Port 1 View:  Extensive left lower lobe airspace consolidation with left pleural effusion. Widespread pulmonary edema appearance. Suspect widespread interstitial pneumonitis, likely of infectious etiology. Edema due to cardiac failure is possible but felt to be less likely given appearance of the cardiac silhouette.     Medications:   . baclofen  5 mg Oral TID  . dolutegravir  50 mg Oral Daily  . emtricitabine-tenofovir AF  1 tablet Oral Daily  . feeding supplement (ENSURE ENLIVE)  237 mL Oral TID BM  . magnesium oxide  400 mg Oral TID  . multivitamin  5 mL Oral Daily  . sodium chloride flush  10-40 mL Intracatheter Q12H  . sodium chloride flush  10-40 mL Intracatheter Q12H  . sulfamethoxazole-trimethoprim  20 mL Oral Daily   Continuous Infusions: . dextrose 75 mL/hr at 11/13/17 0257  . levofloxacin (LEVAQUIN) IV Stopped (11/12/17 1626)  .  sodium bicarbonate (isotonic) infusion in sterile water 50 mL/hr at 11/12/17 1823     LOS: 21 days   Sefora Tietje  [facility] Pager [phone]. If unable to reach me by pager, please call my cell phone at [phone].  *Please refer to [email or web address], password [email or web address] to get updated schedule on who will round on this patient, as hospitalists switch teams weekly. If 7PM-7AM, please contact night-coverage at [email or web address], password [email or web address] for any overnight needs.  11/13/2017, 8:27 AM

## 2017-11-13 NOTE — Progress Notes (Signed)
Pt cbg read as "low". Initiated hypoglycemia protocol. Notified MD. New orders given. Will continue to follow.

## 2017-11-13 NOTE — Progress Notes (Signed)
Pt bladder scan showed 730 mL of urine. Pt expresses need to urinate, but is unable. Foley has been attempted, but unsuccessful. Order placed for coude catheter to be placed. Pt agreeable. Will have coude competent nurse insert.   Berdine DanceLauren Moffitt BSN, RN

## 2017-11-13 NOTE — Progress Notes (Signed)
Pt hypoglycemic over night multiple times. RN provided several interventions (see MAR) per on call provider. Will update day shift RN.

## 2017-11-13 NOTE — Progress Notes (Signed)
[facility]  Date of Admission:  09/10/17     Total days of antibiotics 4  Metronidazole 11/15 >> Ceftriaxone 11/15 >>  Levofloxacin 11/12 >> 11/15 Vancomycin 11/13 >> 11/14          ASSESSMENT/PLAN  Aspiration pneumonia - Agree with changing levofloxacin to ceftriaxone as levofloxacin may be contributing to his multiple hypoglycemic events which is also complicated with decreased nutritional intake.   Thrombocytopenia - Likely multifactorial including sepsis and advanced HIV. Given CD4 count of 20 he is at high risk for and likely experiencing IRIS with an opportunistic infection with leukocytosis. Will continue Bactrim for PCP prophylaxis. Consider additional testing and treatments pending palliative care meeting.   HIV-AIDS -  Updated CD4 count on 11/9 at 20 placing him at exceptionally increasing risk for opportunistic infection. Continue current dosage of dolutegravir and emtricitabine-tenofovir (Descovy). Continue Bactrim for PCP prophylaxis and consider additional azithromycin with last dosage on 11/6. No evidence of systemic bacteremia/fungemia with cultures remaining with no growth. Pending palliative care goals, may need NG tube to continue receiving anti-retrovirals.   Failure to Thrive / Malnutrition - Continue nutrition per primary.   Principal Problem:   Chronic diarrhea Active Problems:   AIDS (acquired immune deficiency syndrome) (HCC)   Non-compliance   Protein-calorie malnutrition, severe   Dehydration   Thrombocytopenia (HCC)   Anemia in other chronic diseases classified elsewhere   AIDS-associated secretory diarrhea (HCC)   Pressure injury of skin   Elevated LFTs   Goals of care, counseling/discussion   Palliative care encounter   Hypoglycemia without diagnosis of diabetes mellitus   . baclofen  5 mg Oral TID  . dolutegravir  50 mg Oral Daily  . emtricitabine-tenofovir AF  1 tablet Oral Daily  . feeding supplement (ENSURE  ENLIVE)  237 mL Oral TID BM  . magnesium oxide  400 mg Oral TID  . multivitamin  5 mL Oral Daily  . sodium chloride flush  10-40 mL Intracatheter Q12H  . sodium chloride flush  10-40 mL Intracatheter Q12H  . sulfamethoxazole-trimethoprim  20 mL Oral Daily  . tamsulosin  0.4 mg Oral Daily    SUBJECTIVE:  Afebrile overnight. WBC increasing. Continues to have episodes of hypoglycemia. Levofloxacin changed to Ceftriaxone and metronidazole. Blood cultures drawn on 11/13 show no growth to date.  Allergies  Allergen Reactions  . Penicillins Hives    Has patient had a PCN reaction causing immediate rash, facial/tongue/throat swelling, SOB or lightheadedness with hypotension: Yes Has patient had a PCN reaction causing severe rash involving mucus membranes or skin necrosis: No Has patient had a PCN reaction that required hospitalization: No Has patient had a PCN reaction occurring within the last 10 years: No If all of the above answers are "NO", then may proceed with Cephalosporin use.     Review of Systems: Review of Systems  Unable to perform ROS: Acuity of condition      OBJECTIVE: Vitals:   11/12/17 1810 11/12/17 2000 11/13/17 0031 11/13/17 0400  BP: (!) 83/53 (!) 83/55 (!) 86/47 91/62  Pulse: (!) 106 (!) 114 (!) 114 (!) 108  Resp: (!) 27 (!) 28 (!) 25 (!) 27  Temp: 98 F (36.7 C) 99.2 F (37.3 C) 97.7 F (36.5 C) (!) 97.4 F (36.3 C)  TempSrc: Axillary Oral Oral Axillary  SpO2: 98% 100% 97% 98%  Weight:    151 lb 0.2 oz (68.5 kg)  Height:       Body mass index is  24.37 kg/m.  Physical Exam  Constitutional: He appears malnourished. He appears unhealthy. He appears cachectic. He has a sickly appearance.  Lying in bed and very lethargic. Opens eyes to voice.   Cardiovascular: Regular rhythm and intact distal pulses. Tachycardia present. Exam reveals no gallop and no friction rub.  No murmur heard. Pulmonary/Chest: No accessory muscle usage. Tachypnea noted. No  respiratory distress. He has no wheezes. He has no rhonchi. He has no rales.  Abdominal: Bowel sounds are normal. He exhibits no mass. There is splenomegaly. There is no tenderness. There is no rebound and no guarding.    Lab Results Lab Results  Component Value Date   WBC 16.4 (H) 11/13/2017   HGB 8.6 (L) 11/13/2017   HCT 24.8 (L) 11/13/2017   MCV 81.8 11/13/2017   PLT 11 (LL) 11/13/2017    Lab Results  Component Value Date   CREATININE 1.63 (H) 11/13/2017   BUN 51 (H) 11/13/2017   NA 136 11/13/2017   K 3.9 11/13/2017   CL 111 11/13/2017   CO2 17 (L) 11/13/2017    Lab Results  Component Value Date   ALT 33 11/13/2017   AST 150 (H) 11/13/2017   ALKPHOS 202 (H) 11/13/2017   BILITOT 4.3 (H) 11/13/2017     Microbiology: Recent Results (from the past 240 hour(s))  Culture, blood (routine x 2)     Status: None (Preliminary result)   Collection Time: 11/11/17  9:39 AM  Result Value Ref Range Status   Specimen Description BLOOD LEFT HAND  Final   Special Requests IN PEDIATRIC BOTTLE Blood Culture adequate volume  Final   Culture NO GROWTH 2 DAYS  Final   Report Status PENDING  Incomplete  Culture, blood (routine x 2)     Status: None (Preliminary result)   Collection Time: 11/11/17 10:00 AM  Result Value Ref Range Status   Specimen Description BLOOD LEFT HAND  Final   Special Requests IN PEDIATRIC BOTTLE Blood Culture adequate volume  Final   Culture NO GROWTH 2 DAYS  Final   Report Status PENDING  Incomplete     Marcos EkeGreg Kashif Pooler, NP [facility] [phone] Pager  11/13/2017  2:03 PM

## 2017-11-13 NOTE — Progress Notes (Signed)
Daily Progress Note   Patient Name: Anthony Yang       Date: 11/13/2017 DOB: 1986-09-06  Age: 31 y.o. MRN#: [id number] Attending Physician: Tonye Royalty, MD Primary Care Physician: Patient, No Pcp Per Admit Date: 10/11/2017  Reason for Consultation/Follow-up: Establishing goals of care  Subjective: Anthony Yang is more alert and asking for ice. Moans and in pain. Continues to say "thank you" to everyone involved in care. Anxious about pain while being cleaned.   Length of Stay: 21  Current Medications: Scheduled Meds:  . baclofen  5 mg Oral TID  . dolutegravir  50 mg Oral Daily  . emtricitabine-tenofovir AF  1 tablet Oral Daily  . feeding supplement (ENSURE ENLIVE)  237 mL Oral TID BM  . magnesium oxide  400 mg Oral TID  . multivitamin  5 mL Oral Daily  . sodium chloride flush  10-40 mL Intracatheter Q12H  . sodium chloride flush  10-40 mL Intracatheter Q12H  . sulfamethoxazole-trimethoprim  20 mL Oral Daily  . tamsulosin  0.4 mg Oral Daily    Continuous Infusions: . cefTRIAXone (ROCEPHIN)  IV 1 g (11/13/17 1031)   And  . metronidazole    . dextrose 100 mL/hr at 11/13/17 0834  .  sodium bicarbonate (isotonic) infusion in sterile water 50 mL/hr at 11/12/17 1823    PRN Meds: acetaminophen **OR** acetaminophen, albuterol, fentaNYL (SUBLIMAZE) injection, Gerhardt's butt cream, loperamide, ondansetron (ZOFRAN) IV, phenol, prochlorperazine, RESOURCE THICKENUP CLEAR, simethicone, sodium chloride flush, sodium chloride flush, traZODone  Physical Exam         Constitutional: He appears lethargic. He appears cachectic. He has a sickly appearance.  Cardiovascular: Regular rhythm. Tachycardia present.  Pulmonary/Chest: Effort normal. No accessory muscle usage. No tachypnea. No  respiratory distress.  Abdominal: Normal appearance.  Neurological: He appears more alert today but confused.   Vital Signs: BP 91/62   Pulse (!) 108   Temp (!) 97.4 F (36.3 C) (Axillary)   Resp (!) 27   Ht _0  (1.676 m)   Wt 68.5 kg (151 lb 0.2 oz)   SpO2 98%   BMI 24.37 kg/m  SpO2: SpO2: 98 % O2 Device: O2 Device: Nasal Cannula O2 Flow Rate: O2 Flow Rate (L/min): 4 L/min  Intake/output summary:   Intake/Output Summary (Last 24 hours) at 11/13/2017 1051 Last  data filed at 11/13/2017 0930 Gross per 24 hour  Intake 1694.58 ml  Output 600 ml  Net 1094.58 ml   LBM: Last BM Date: 11/11/17 Baseline Weight: Weight: 53.1 kg (117 lb) Most recent weight: Weight: 68.5 kg (151 lb 0.2 oz)       Palliative Assessment/Data: 20%      Patient Active Problem List   Diagnosis Date Noted  . Hypoglycemia without diagnosis of diabetes mellitus 11/13/2017  . Elevated LFTs 11/12/2017  . Goals of care, counseling/discussion   . Palliative care encounter   . Pressure injury of skin 11/09/2017  . Anemia in other chronic diseases classified elsewhere 11/02/2017  . AIDS-associated secretory diarrhea ([place])   . Thrombocytopenia ([place]) 10/29/2017  . Chronic diarrhea   . Dehydration 10/19/2017  . Human immunodeficiency virus (HIV) disease ([place])   . Dyspnea   . Thrush   . Protein-calorie malnutrition, severe 09/27/2017  . Sepsis, unspecified organism ([place]) 09/25/2017  . Symptomatic anemia 09/25/2017  . Hyponatremia 09/25/2017  . History of GI bleed 09/25/2017  . AIDS (acquired immune deficiency syndrome) ([place]) 09/25/2017  . Non-compliance 09/25/2017    Palliative Care Assessment & Plan   HPI: 31 y.o. male  with past medical history of HIV/AIDs (CD4 59) diagnosed 2013 but off ART for 2 years until 10/01/2017, recently treated for Giardia, suspected MAI, severe anemia during admission 9/27-10/4/18 and now admitted on 10/18/2017 with severe dehydration, hyponatremia, hypokalemia. He  has experienced severe weight loss, malnutrition and weakness. He has continued to decline this hospitalization and unable to tolerate intake. Hospitalization also complicated by aspiration pneumonia sepsis. Palliative care requested to assist with Index given poor prognosis and disposition.   Assessment: I met at bedside with Anthony Yang (in and out of sleep) and his mother. Mother says "he's doing good today." She clearly has more hope today than when I met with her yesterday. She expresses great concern that he received morphine yesterday and I believe is contributing his lethargy to this - she strongly opposes him receiving any further morphine. She does agree that we can give him a different pain medication - "anything but morphine." I have changed to IV fentanyl for now. I did reiterate to her gently that we are watching his kidney function closely and worried about this - she asks if this worsens if he will need dialysis. I was very straight forward in saying that his body would not tolerate dialysis. I also explained that he is needing more and more support to maintain blood sugars which is a very poor sign that his body is not able to function and all this fluid can cause more swelling and more issues for him. She is very quiet. She comments that she needs to get him to eat more. She is greatly struggling with poor prognosis I plan to return this afternoon and talk further with her when her husband is also present.   Late entry: Went to follow up with mother and father together at bedside. Unfortunately we were quickly interrupted by family (they are not sharing his status with family so do NOT discuss anything in front of any visitors except mother/father). Spoke extensively again with mother and was joined by Anthony Yang' friend, Anthony Yang, who is aware of [place]' health state and mother wanted to include him in conversation. Explained our limitations as we believe that his bone marrow is failing and his body is  failing. Anthony Yang asked about bone marrow transplant and I explained that this would not be an  option and that Anthony Yang is too weak to sustain anything like that. Explained again the poor prognosis with D10 and concern that his organs will further fail and we cannot fix any of this for him. Explained to mother that we do a lot to keep people alive but these measures will not help Anthony Yang as they will not fix his problem. She says "I'm not giving up hope" as she continues to pray for a miracle. She then tells me "I'm just numb." I empathize that she is overwhelmed, emotional, and exhausted. She continues to question "why did he wait so long for treatment." She tells me "you [the medical team] do what you need to do." Mother seems to desire any interventions outside DNR to keep Kitt alive at this time. I will follow up tomorrow.   Recommendations/Plan:  Pain (abd + generalized): An agrees to foley catheter for urine retention but still refusing at times. Mother has strong feelings against morphine - I did not argue but changed to fentanyl 25 mcg IV every 2 hours prn. Discussed use of pain medication with family.    Code Status:  DNR  Prognosis:   Prognosis very poor and could be days. Declining rapidly.   Discharge Planning:  To Be Determined. At this point I expect that he will likely have a hospital death.   Care plan was discussed with Dr. Rockne Menghini  Thank you for allowing the Palliative Medicine Team to assist in the care of this patient.   Total Time 0930-1000, 1615-1715 90 min Prolonged Time Billed  yes       Greater than 50%  of this time was spent counseling and coordinating care related to the above assessment and plan.  Vinie Sill, NP Palliative Medicine Team Pager # [phone] (M-F 8a-5p) Team Phone # [phone] (Nights/Weekends)

## 2017-11-13 NOTE — Progress Notes (Signed)
Pt BP 82/57. Has had a couple of watery stools with dark red bloody appearance. MD notified. This is not a new occurrence for pt. No new orders. Will continue current plan of care.  Berdine DanceLauren Moffitt BSN, RN

## 2017-11-13 NOTE — Progress Notes (Signed)
Patient c/o "hurting all over" and mumbling. Patient's father stated that, " he didn't do this last night". This RN told the father that we can get him through the night with the fentanyl and that the family can speak with the MD in the morning concerning the pain regimen. Will continue to monitor.

## 2017-11-13 NOTE — Care Management Note (Signed)
Case Management Note  Patient Details  Name: Anthony Yang MRN: [id number] Date of Birth: January 01, 1986  Subjective/Objective:       Admitted with diarrhea/dehydration,FTT, history of AIDS, recently treated for diarrhea. Resides with parents.    Anthony Yang (Mother) Anthony PhilipsRobert Yang (Father)    [phone]     PCP: pt with no PCP,however, post hospital follow up for is scheduled for 11/14/2017 at 1:00 pm [facility].   Action/Plan: ID, GI following... CM to f/u with disposition needs.  Expected Discharge Date:                  Expected Discharge Plan:  Home/Self Care(Resides with parents)  In-House Referral:     Discharge planning Services  CM Consult, Follow-up appt scheduled  Post Acute Care Choice:    Choice offered to:     DME Arranged:    DME Agency:     HH Arranged:    HH Agency:     Status of Service:  In process, will continue to follow  If discussed at Long Length of Stay Meetings, dates discussed:    Additional Comments:  11/13/17-1420- Anthony PieriniKristi Macklen Wilhoite RN, CM- TPN has been stopped-pt now DNR, PC has been consulted for GOC- [facility] canceled for tomorrow 11/16- will call back if needed to reschedule-   11/10/17- 1500- Anthony Feng RN, CM- pt with long continue stay still unable to tolerate TF- has been started on TPN. Dispo uncertain at this time- CM and CSW continue to follow for d/c needs/plan- [facility].   Anthony Yang, [place] Hall, RN 11/13/2017, 2:20 PM [phone]

## 2017-11-14 ENCOUNTER — Ambulatory Visit: Payer: Self-pay | Admitting: Family Medicine

## 2017-11-14 ENCOUNTER — Inpatient Hospital Stay: Payer: Self-pay | Admitting: Infectious Diseases

## 2017-11-14 DIAGNOSIS — R109 Unspecified abdominal pain: Secondary | ICD-10-CM

## 2017-11-14 DIAGNOSIS — J189 Pneumonia, unspecified organism: Secondary | ICD-10-CM

## 2017-11-14 DIAGNOSIS — R17 Unspecified jaundice: Secondary | ICD-10-CM

## 2017-11-14 DIAGNOSIS — R748 Abnormal levels of other serum enzymes: Secondary | ICD-10-CM

## 2017-11-14 LAB — BASIC METABOLIC PANEL
Anion gap: 10 (ref 5–15)
BUN: 55 mg/dL — ABNORMAL HIGH (ref 6–20)
CALCIUM: 5 mg/dL — AB (ref 8.9–10.3)
CO2: 17 mmol/L — ABNORMAL LOW (ref 22–32)
Chloride: 104 mmol/L (ref 101–111)
Creatinine, Ser: 1.9 mg/dL — ABNORMAL HIGH (ref 0.61–1.24)
GFR, EST AFRICAN AMERICAN: 53 mL/min — AB (ref >60)
GFR, EST NON AFRICAN AMERICAN: 45 mL/min — AB (ref >60)
Glucose, Bld: 70 mg/dL (ref 65–99)
Potassium: 3.4 mmol/L — ABNORMAL LOW (ref 3.5–5.1)
SODIUM: 131 mmol/L — AB (ref 135–145)

## 2017-11-14 LAB — GLUCOSE, CAPILLARY
GLUCOSE-CAPILLARY: 107 mg/dL — AB (ref 65–99)
GLUCOSE-CAPILLARY: 50 mg/dL — AB (ref 65–99)
GLUCOSE-CAPILLARY: 56 mg/dL — AB (ref 65–99)
GLUCOSE-CAPILLARY: 75 mg/dL (ref 65–99)
GLUCOSE-CAPILLARY: 77 mg/dL (ref 65–99)
GLUCOSE-CAPILLARY: 96 mg/dL (ref 65–99)
Glucose-Capillary: <10 mg/dL — CL (ref 65–99)
Glucose-Capillary: 74 mg/dL (ref 65–99)
Glucose-Capillary: 116 mg/dL — ABNORMAL HIGH (ref 65–99)
Glucose-Capillary: 38 mg/dL — CL (ref 65–99)

## 2017-11-14 LAB — CBC
HCT: 20.2 % — ABNORMAL LOW (ref 39.0–52.0)
HEMOGLOBIN: 7.2 g/dL — AB (ref 13.0–17.0)
MCH: 29 pg (ref 26.0–34.0)
MCHC: 35.6 g/dL (ref 30.0–36.0)
MCV: 81.5 fL (ref 78.0–100.0)
PLATELETS: 8 10*3/uL — AB (ref 150–400)
RBC: 2.48 MIL/uL — AB (ref 4.22–5.81)
RDW: 17 % — ABNORMAL HIGH (ref 11.5–15.5)
WBC: 13 10*3/uL — ABNORMAL HIGH (ref 4.0–10.5)

## 2017-11-14 LAB — MAGNESIUM: MAGNESIUM: 1.8 mg/dL (ref 1.7–2.4)

## 2017-11-14 MED ORDER — LORAZEPAM 2 MG/ML IJ SOLN
0.2500 mg | Freq: Once | INTRAMUSCULAR | Status: AC
  Administered 2017-11-14: 0.25 mg via INTRAVENOUS
  Filled 2017-11-14: qty 1

## 2017-11-14 MED ORDER — HYDROMORPHONE HCL 1 MG/ML IJ SOLN
0.5000 mg | Freq: Once | INTRAMUSCULAR | Status: AC
  Administered 2017-11-14: 0.5 mg via INTRAVENOUS
  Filled 2017-11-14: qty 0.5

## 2017-11-14 MED ORDER — DEXTROSE 50 % IV SOLN
INTRAVENOUS | Status: AC
  Administered 2017-11-14: 50 mL
  Filled 2017-11-14: qty 50

## 2017-11-14 MED ORDER — SODIUM CHLORIDE 4 MEQ/ML IV SOLN
INTRAVENOUS | Status: DC
  Administered 2017-11-14 – 2017-11-15 (×3): via INTRAVENOUS
  Filled 2017-11-14 (×6): qty 1000

## 2017-11-14 MED ORDER — MORPHINE SULFATE (PF) 2 MG/ML IV SOLN
2.0000 mg | Freq: Once | INTRAVENOUS | Status: AC
  Administered 2017-11-14: 2 mg via INTRAVENOUS
  Filled 2017-11-14: qty 1

## 2017-11-14 MED ORDER — DEXTROSE 50 % IV SOLN
INTRAVENOUS | Status: AC
  Administered 2017-11-14: 25 mL
  Filled 2017-11-14: qty 50

## 2017-11-14 MED ORDER — LORAZEPAM 2 MG/ML IJ SOLN
0.2500 mg | Freq: Four times a day (QID) | INTRAMUSCULAR | Status: DC | PRN
  Administered 2017-11-14: 0.25 mg via INTRAVENOUS
  Administered 2017-11-15: 0.5 mg via INTRAVENOUS
  Filled 2017-11-14 (×2): qty 1

## 2017-11-14 MED ORDER — FENTANYL CITRATE (PF) 100 MCG/2ML IJ SOLN
50.0000 ug | INTRAMUSCULAR | Status: DC | PRN
  Administered 2017-11-14 – 2017-11-15 (×2): 50 ug via INTRAVENOUS
  Filled 2017-11-14 (×2): qty 2

## 2017-11-14 MED ORDER — LORAZEPAM 0.5 MG PO TABS: 0.5000 mg | ORAL_TABLET | Freq: Once | ORAL | Status: DC

## 2017-11-14 NOTE — Progress Notes (Signed)
Pt very restless and continues to moan; IV fentanyl administered at 1622. Pt's RR up to mid-high 40s. Pt appears to be in pain; however is unable to express his needs. Pt's parents concerned and stated they want pt "to get some rest and be comfortable". Not able to give PRN ativan at this time. Palliative MD notified. This RN received one-time order for IV dilaudid.    Berdine DanceLauren Moffitt BSN, RN

## 2017-11-14 NOTE — Plan of Care (Signed)
  Health Behavior/Discharge Planning: Ability to manage health-related needs will improve 11/14/2017 0125 - Not Progressing by Jill SideNiemela, Clarine Elrod R, RN   Pain Managment: General experience of comfort will improve 11/14/2017 0125 - Not Progressing by Jill SideNiemela, Hayleigh Bawa R, RN   Skin Integrity: Risk for impaired skin integrity will decrease 11/14/2017 0125 - Not Progressing by Jill SideNiemela, Wayman Hoard R, RN   Nutrition: Adequate nutrition will be maintained 11/14/2017 0125 - Not Progressing by Jill SideNiemela, Kolden Dupee R, RN

## 2017-11-14 NOTE — Progress Notes (Signed)
Hypoglycemic Event  CBG: 56  Treatment: dextrose 50% 25 mL IV push  Symptoms: pt lethargic  Follow-up CBG: Time: 1300 CBG Result: 74  Possible Reasons for Event: pt not eating or drinking   Comments/MD notified:     Jefferson FuelLauren B Moffitt

## 2017-11-14 NOTE — Progress Notes (Signed)
Patient's CBG 38 @2135 . D50 given. Repeat CBG 116. Notified Dr. Selena BattenKim. Rate increase on fluids and CBGs changed to Q4. Will continue to monitor

## 2017-11-14 NOTE — Progress Notes (Addendum)
Daily Progress Note   Patient Name: Anthony Yang       Date: 11/14/2017 DOB: 25-Jun-1986  Age: 31 y.o. MRN#: [id number] Attending Physician: Lucy Antigua, MD Primary Care Physician: Patient, No Pcp Per Admit Date: 10/08/2017  Reason for Consultation/Follow-up: Establishing goals of care  Subjective: Anthony Yang is anxious and moaning, complains of pain. Family at bedside telling him he is okay and not in pain.   Length of Stay: 22  Current Medications: Scheduled Meds:  . baclofen  5 mg Oral TID  . dolutegravir  50 mg Oral Daily  . emtricitabine-tenofovir AF  1 tablet Oral Daily  . feeding supplement (ENSURE ENLIVE)  237 mL Oral TID BM  . magnesium oxide  400 mg Oral TID  . multivitamin  5 mL Oral Daily  . sodium chloride flush  10-40 mL Intracatheter Q12H  . sodium chloride flush  10-40 mL Intracatheter Q12H  . sulfamethoxazole-trimethoprim  20 mL Oral Daily  . tamsulosin  0.4 mg Oral Daily    Continuous Infusions: . D-10-0.45% Sodium Chloride with KCL 40 meq/L 1000 ml 100 mL/hr at 11/14/17 0918  . metronidazole Stopped (11/14/17 1245)  .  sodium bicarbonate (isotonic) infusion in sterile water 50 mL/hr at 11/14/17 1021    PRN Meds: acetaminophen **OR** acetaminophen, albuterol, fentaNYL (SUBLIMAZE) injection, Gerhardt's butt cream, loperamide, ondansetron (ZOFRAN) IV, phenol, prochlorperazine, RESOURCE THICKENUP CLEAR, simethicone, sodium chloride flush, sodium chloride flush, traZODone  Physical Exam         Constitutional: He appears lethargic. He appears cachectic. He has a sickly appearance.  Cardiovascular: Regular rhythm. Tachycardia present.  Pulmonary/Chest: Effort normal. No accessory muscle usage. No tachypnea. No respiratory distress.  Abdominal: Normal  appearance.  Neurological: He appears more alert today but confused.   Vital Signs: BP (!) 101/53 (BP Location: Left Leg)   Pulse (!) 106   Temp 98 F (36.7 C) (Axillary)   Resp 20   Ht 5\' 6"  (1.676 m)   Wt 66.2 kg (146 lb)   SpO2 99%   BMI 23.57 kg/m  SpO2: SpO2: 99 % O2 Device: O2 Device: Nasal Cannula O2 Flow Rate: O2 Flow Rate (L/min): 4 L/min  Intake/output summary:   Intake/Output Summary (Last 24 hours) at 11/14/2017 1509 Last data filed at 11/14/2017 1235 Gross per 24 hour  Intake -  Output 630 ml  Net -630 ml   LBM: Last BM Date: 11/13/17 Baseline Weight: Weight: 53.1 kg (117 lb) Most recent weight: Weight: 66.2 kg (146 lb)       Palliative Assessment/Data: 20%      Patient Active Problem List   Diagnosis Date Noted  . Hypoglycemia without diagnosis of diabetes mellitus 11/13/2017  . Acute urinary retention 11/13/2017  . Elevated LFTs 11/12/2017  . Goals of care, counseling/discussion   . Palliative care encounter   . Pressure injury of skin 11/09/2017  . Anemia in other chronic diseases classified elsewhere 11/02/2017  . AIDS-associated secretory diarrhea (HCC)   . Thrombocytopenia (HCC) 10/29/2017  . Chronic diarrhea   . Dehydration 09-12-17  . Human immunodeficiency virus (HIV) disease (HCC)   . Dyspnea   . Thrush   . Protein-calorie malnutrition, severe 09/27/2017  . Sepsis, unspecified organism (HCC) 09/25/2017  . Symptomatic anemia 09/25/2017  . Hyponatremia 09/25/2017  . History of GI bleed 09/25/2017  . AIDS (acquired immune deficiency syndrome) (HCC) 09/25/2017  . Non-compliance 09/25/2017    Palliative Care Assessment & Plan   HPI: 31 y.o. male  with past medical history of HIV/AIDs (CD4 2528) diagnosed 2013 but off ART for 2 years until 10/01/2017, recently treated for Giardia, suspected MAI, severe anemia during admission 9/27-10/4/18 and now admitted on 09-12-17 with severe dehydration, hyponatremia, hypokalemia. He has  experienced severe weight loss, malnutrition and weakness. He has continued to decline this hospitalization and unable to tolerate intake. Hospitalization also complicated by aspiration pneumonia sepsis. Palliative care requested to assist with GOC given poor prognosis and disposition.   Assessment: Anthony Yang anxious and confused. Parents at bedside and appear exhausted. Spoke extensively with his mother. She is still struggling with regret and guilt and why Anthony Yang made the choice to not take his medicine. We further discussed the fear that Anthony Yang continues to have that other family and friends will find out - this choice seems based on his internal struggle with his diagnosis and not with his parents.   She was able to tell me that she is not giving up hope and will continue to pray. However she was also able to verbalize "my son is dying." She understands that we are doing everything we can and that what we are doing is not working. His organs are beginning to shut down. We further discussed the importance of keeping him comfortable. Will increase fentanyl and add ativan prn with parents permission - he appears miserable.   I would recommend that the medical team work together to continue to reiterate that we have done everything we can and avoid escalating care as this will only cause him more pain and suffering. Mother will have difficulties making limitations in care (especially while he is alert). May be more open to comfort focused care as mentation deteriorates.    Recommendations/Plan:  Pain (abd + generalized): Mother has strong feelings against morphine - fentanyl increased to 50 mcg IV every 2 hours prn. Discussed use of pain medication with family to minimize suffering.   Anxiety: Ativan 0.25-0.5 mg every 6 hours prn. May titrate as needed.   Family hesitant for any sedating medications as they want him to be able to visit with his friends/family.    Code Status:  DNR  Prognosis:    Prognosis very poor and could be days. Declining rapidly.   Discharge Planning:  To Be Determined. At this point I expect that he will likely have a hospital death.  Care plan was discussed with Dr. Darnelle Catalanama  Thank you for allowing the Palliative Medicine Team to assist in the care of this patient.   Total Time 50 min Prolonged Time Billed  no      Greater than 50%  of this time was spent counseling and coordinating care related to the above assessment and plan.  Yong ChannelAlicia Catina Nuss, NP Palliative Medicine Team Pager # [phone] (M-F 8a-5p) Team Phone # [phone] (Nights/Weekends)

## 2017-11-14 NOTE — Progress Notes (Signed)
Hypoglycemic Event  CBG: 50   Treatment: dextrose 50% 50 mL IV push given  Symptoms: lethargic  Follow-up CBG: Time: 1700 CBG Result:  75  Possible Reasons for Event: pt at EOL; not eating or drinking   Comments/MD notified:     Jefferson FuelLauren B Moffitt

## 2017-11-14 NOTE — Progress Notes (Signed)
Progress Note    Anthony Yang  QIH:[id number] DOB: Nov 30, 1986  DOA: 10/11/2017 PCP: Patient, No Pcp Per    Brief Narrative:   Chief complaint: Follow-up aspiration pneumonia  Medical records reviewed and are as summarized below:  Anthony Yang is an 31 y.o. male with a PMH of HIV/AIDS, last CD4 count 28, who was admitted 10/06/2017 from the ID clinic for treatment of persistent diarrhea complicated by dehydration with associated 50 pound weight loss over 6 months in the setting of recent treatment for Giardia. Upon presentation, he had significant electrolyte abnormalities including hyponatremia and hypokalemia. He has had a prolonged hospital stay due to generalized failure to thrive and poor nutritional status, requiring treatment with TPN. Hospital course further complicated by the development of aspiration pneumonia/sepsis. Family at this point has agreed to DO NOT RESUSCITATE status. Poor prognosis discussed with family and palliative care has been consulted for goals of care.  Assessment/Plan:   Principal Problem: Sepsis due to aspiration pneumonia, not present on admission/dysphasia Initially treated with Levaquin/vancomycin, stress dose steroids. Vancomycin discontinued 11/12/17, and Levaquin stopped 11/13/17 secondary to hypoglycemia. Started on Rocephin/Flagyl as an alternative. Now, Rocephin discontinued due to concerns regarding elevated LFTs. Overall prognosis remains poor given severe immune suppression. Patient's family agrees for bipap if needed but no invasive mechanical ventilation. Chest films show infiltrates on the right lower lobe and left upper lobe. Evaluated by speech therapist and placed on a dysphagia 2 diet with nectar thickened liquids  Active problems:  Urinary retention Coud Foley catheter placed 11/13/17. Will discontinue Flomax.  Hypoglycemia Multiple episodes overnight. Dextrose infusion increased. Levaquin discontinued 11/13/17 in case this is  driving the hypoglycemia.  Elevated LFTs No history of viral hepatitis. Liver appeared normal in size and contour on CT done 11/06/17, but there was periportal edema and possible mild sludge within the gallbladder lumen. Pancreas was unremarkable. AST/bilirubin are rising. Rocephin discontinued in light of rising LFTs. Would not stop anti-retroviral treatment at this point, as it is offering him the only chance at disease control.  Hypovolemia due to significant GI losses He is third spacinging and is extremely malnourished. Continue IV fluids as tolerated.  Acute on chronic anemia plus thrombocytopenia Continues with worsening anemia, and thrombocytopenia. There are no signs of hemolysis and fibrinogen is not low, not suggestive of DIC. Very poor prognosis.  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 Hemoglobin 7.2 today, status post 2 units of PRBCs 11/12/17. Platelet count only 11. May need to get hematology involved if the family opts to continue to pursue aggressive treatment. Discussed case with ID with differential being HIV affecting bone marrow, or infection chest as parvovirus or MAC.  AKI with hyponatremia/hypokalemia/ hypocalcemia, with non gap metabolic alkalosis Renal function continues to deteriorate. Electrolyte derangements are likely due to diarrhea. Potassium replaced. Continue IV fluids with sodium bicarbonate.  Acute or chronic diarrhea RN reports ongoing frequent loose stools.   HIV-AIDS Continue anti retroviral therapy (Truvada, Tivicay, Biktarvy), per ID recommendations.Continue Bactrim/Azithro for prophylaxis. Given overall poor prognosis, palliative care was consulted and is having ongoing discussions with the family regarding goals of care. Patient's father does appear to except his son's prognosis, but his mother continues to have hope for recovery. As such, the family has not yet been ready to withdraw care.  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 Has been on TPN. Currently not on any supplemental nutritional support.  Hypokalemia Add potassium to IV fluids.   Family  Communication/Anticipated D/C date and plan/Code Status   DVT prophylaxis: Anticoagulation is contraindicated given significant thrombocytopenia. Code Status: DO NOT RESUSCITATE.  Family Communication: Parents updated bedside. Disposition Plan: Family is not ready to consider residential hospice inpatient too unstable for discharge.  Medical Consultants:    Palliative Care  Critical Care  Gastroenterology  Infectious Disease   Anti-Infectives:    Vancomycin 11/11/17---> 11/12/17  Levaquin 11/10/17---> 11/13/17  Rocephin 11/13/17---> 11/14/17  Flagyl 11/13/17--->  Albenza 10/28/17--->10/29/17  Subjective:   Unable to urinate again this morning, refused foley cath placement. Still with generalized pain and abdominal pain.  No vomiting. 3 loose stools this morning.    Objective:    Vitals:   11/13/17 2300 11/13/17 2313 11/14/17 0000 11/14/17 0444  BP:  (!) 94/51 (!) 79/40 (!) 91/53  Pulse: (!) 105 (!) 107 (!) 106 (!) 110  Resp: (!) 23 (!) 25 (!) 31 (!) 28  Temp:  97.8 F (36.6 C)    TempSrc:  Axillary    SpO2: 98% 97% 97% 98%  Weight:    66.2 kg (146 lb)  Height:        Intake/Output Summary (Last 24 hours) at 11/14/2017 0829 Last data filed at 11/14/2017 0500 Gross per 24 hour  Intake 1450 ml  Output 650 ml  Net 800 ml   Filed Weights   11/12/17 0500 11/13/17 0400 11/14/17 0444  Weight: 65.5 kg (144 lb 6.4 oz) 68.5 kg (151 lb 0.2 oz) 66.2 kg (146 lb)    Exam: (Unchanged from 11/12/17) General: Cachectic, chronically ill-appearing. Cardiovascular: Tachycardic with a hyperdynamic precordium. No gallops or rubs. No murmurs. No JVD. Lungs: Clear to auscultation bilaterally with diminished air movement. No rales, rhonchi or wheezes. Abdomen: Soft, nontender, nondistended with normal active bowel sounds. No masses.  +splenomegaly. Skin: Warm and dry. No rashes or lesions. Extremities: +clubbing, no cyanosis. 2+ edema. Pedal pulses 2+.  Data Reviewed:   I have personally reviewed following labs and imaging studies:  Labs: Labs show the following:   Basic Metabolic Panel: Recent Labs  Lab 11/09/17 0456 11/11/17 0410 11/12/17 0439 11/13/17 0359 11/14/17 0429  NA 134* 135 133* 136 131*  K 3.4* 3.5 3.4* 3.9 3.4*  CL 109 111 109 111 104  CO2 21* 19* 16* 17* 17*  GLUCOSE 130* 39* 241* 65 70  BUN 19 34* 42* 51* 55*  CREATININE 0.79 1.11 1.41* 1.63* 1.90*  CALCIUM 6.0* 5.8* 5.6* 5.4* 5.0*  MG 1.8 1.9 2.0 1.8 1.8  PHOS 1.9*  --   --   --   --    GFR Estimated Creatinine Clearance: 50.8 mL/min (A) (by C-G formula based on SCr of 1.9 mg/dL (H)). Liver Function Tests: Recent Labs  Lab 11/09/17 0456 11/13/17 0359  AST 123* 150*  ALT 26 33  ALKPHOS 226* 202*  BILITOT 1.3* 4.3*  PROT 3.7* 3.6*  ALBUMIN <1.0* <1.0*   CBC: Recent Labs  Lab 11/08/17 0432 11/10/17 0749 11/11/17 0410 11/12/17 0439 11/13/17 0359 11/14/17 0429  WBC 5.6 13.7* 14.2* 12.6* 16.4* 13.0*  NEUTROABS 2.2 5.6 8.0* 6.3  --   --   HGB 9.3* 7.9* 7.0* 5.7* 8.6* 7.2*  HCT 27.0* 22.8* 20.1* 16.7* 24.8* 20.2*  MCV 83.3 85.1 82.0 81.1 81.8 81.5  PLT 34* 28* 16* 12* 11* 8*   CBG: Recent Labs  Lab 11/13/17 0831 11/13/17 1220 11/13/17 1622 11/13/17 2324 11/14/17 0603  GLUCAP 92 89 102* 86 77   Lipid Profile: No results for input(s): CHOL,  HDL, LDLCALC, TRIG, CHOLHDL, LDLDIRECT in the last 72 hours.  Microbiology Recent Results (from the past 240 hour(s))  Culture, blood (routine x 2)     Status: None (Preliminary result)   Collection Time: 11/11/17  9:39 AM  Result Value Ref Range Status   Specimen Description BLOOD LEFT HAND  Final   Special Requests IN PEDIATRIC BOTTLE Blood Culture adequate volume  Final   Culture NO GROWTH 2 DAYS  Final   Report Status PENDING  Incomplete  Culture, blood (routine x 2)      Status: None (Preliminary result)   Collection Time: 11/11/17 10:00 AM  Result Value Ref Range Status   Specimen Description BLOOD LEFT HAND  Final   Special Requests IN PEDIATRIC BOTTLE Blood Culture adequate volume  Final   Culture NO GROWTH 2 DAYS  Final   Report Status PENDING  Incomplete    Procedures and diagnostic studies:  11/11/17: Dg Chest Port 1 View:  Persistent densities in the mid and lower left chest. Findings are most compatible with pleural fluid and consolidation. Minimal change from the recent comparison examination. Perihilar densities, left side greater than right. Perihilar densities are nonspecific.   11/11/17: Dg Swallowing Func-speech Pathology: Diet Recommendations Dysphagia 2 (Fine chop) solids;Nectar thick liquid.  11/10/17: Dg Chest Port 1 View:  Extensive left lower lobe airspace consolidation with left pleural effusion. Widespread pulmonary edema appearance. Suspect widespread interstitial pneumonitis, likely of infectious etiology. Edema due to cardiac failure is possible but felt to be less likely given appearance of the cardiac silhouette.     Medications:   . baclofen  5 mg Oral TID  . dolutegravir  50 mg Oral Daily  . emtricitabine-tenofovir AF  1 tablet Oral Daily  . feeding supplement (ENSURE ENLIVE)  237 mL Oral TID BM  . LORazepam  0.5 mg Oral Once  . magnesium oxide  400 mg Oral TID  . multivitamin  5 mL Oral Daily  . sodium chloride flush  10-40 mL Intracatheter Q12H  . sodium chloride flush  10-40 mL Intracatheter Q12H  . sulfamethoxazole-trimethoprim  20 mL Oral Daily  . tamsulosin  0.4 mg Oral Daily   Continuous Infusions: . cefTRIAXone (ROCEPHIN)  IV Stopped (11/13/17 1101)   And  . metronidazole Stopped (11/14/17 0444)  . dextrose 100 mL/hr at 11/13/17 2239  .  sodium bicarbonate (isotonic) infusion in sterile water 50 mL/hr at 11/13/17 1133     LOS: 22 days   Jacquelynn Cree  [facility] Pager [phone]. If  unable to reach me by pager, please call my cell phone at [phone].  *Please refer to [email or web address], password [email or web address] to get updated schedule on who will round on this patient, as hospitalists switch teams weekly. If 7PM-7AM, please contact night-coverage at [email or web address], password [email or web address] for any overnight needs.  11/14/2017, 8:29 AM

## 2017-11-14 NOTE — Progress Notes (Signed)
CRITICAL VALUE ALERT  Critical Value:  Calcium 5.0  Date & Time Notied:  0630 11/16  Provider Notified: Craige CottaKirby, NP  Orders Received/Actions taken: none

## 2017-11-14 NOTE — Progress Notes (Signed)
  Speech Language Pathology Treatment: Dysphagia  Patient Details Name: Anthony Yang MRN: [id number] DOB: [birth year] Today's Date: 11/14/2017 Time: [id number] SLP Time Calculation (min) (ACUTE ONLY): 8 min  Assessment / Plan / Recommendation Clinical Impression  Goal of session to provide further education with parents re: po recommendations and precautions and possible observation. Anthony Yang declined food or liquid due to abdominal pain. Po consumption for several days has consisted of sips water and ice per pt's dad. Mom requested and pt agreeable to oral care. Palliative care present and speaking to pt's mom therefore unable to discuss/educate both parents re: comfort feeds although consuming very little recently. Not much more for ST to offer. Will sign off however please reconsult if needed.     HPI HPI: 31 year old male who presents with generalized weakness. PMH: HIV/AIDS,anemia. Pt referred from the ID clinic due to persistent diarrhea and dehydration, 50 pound weight loss over last 6 months, recently treated for Giardia.Found to have hyponatremia/hypokalemia due to dehydration due to acute on chronic diarrhea,in the setting of immunosuppression. CT abdomen 10/29 revealed bilateral pleural effusions, bibasilar densities, predominantly involving the right lung base are concerning for an infectious process; thickened appearance of the distal esophagus as well as diffusely thickened small bowel. CXR 11/12 Extensive left lower lobe airspace consolidation with left pleural effusion. Widespread pulmonary edema appearance. Suspect widespread interstitial pneumonitis, likely of infectious etiology. BSE 11/3 with aversion behaviors with suspected psychological in nature; aversion may be due to underlying GI issues.       SLP Plan  Continue with current plan of care       Recommendations  Diet recommendations: Dysphagia 2 (fine chop);Nectar-thick liquid(may have ice chips, thin water) Liquids  provided via: Cup Medication Administration: Crushed with puree Supervision: Staff to assist with self feeding;Full supervision/cueing for compensatory strategies Compensations: Minimize environmental distractions;Slow rate;Small sips/bites;Lingual sweep for clearance of pocketing;Multiple dry swallows after each bite/sip Postural Changes and/or Swallow Maneuvers: (upright as tolerated)                Oral Care Recommendations: Oral care BID Follow up Recommendations: None SLP Visit Diagnosis: Dysphagia, oral phase (R13.11) Plan: Continue with current plan of care       GO                [name], Desmin Daleo Willis 11/14/2017, 2:42 PM   Breck CoonsLisa Willis Tayjah Lobdell M.Ed [employer]SLP Pager [phone]

## 2017-11-14 NOTE — Progress Notes (Signed)
[facility]   Reason for visit: Follow up on HIV/AIDS, FTT  Interval History: patient moaning in pain, stomach bothering him, mother and father at bedside.  No TPN, had some Ensure last pm.  No n/v.     Physical Exam: Constitutional:  Vitals:   11/14/17 0000 11/14/17 0444  BP: (!) 79/40 (!) 91/53  Pulse: (!) 106 (!) 110  Resp: (!) 31 (!) 28  Temp:    SpO2: 97% 98%   patient appears in discomfort, moaning Eyes: icteric HENT: no thrush Respiratory: increased respiratory effort; CTA B, anterior exam Cardiovascular: tachy RR GI: soft MS: legs with non-pitting edema  Review of Systems: Gastrointestinal: positive for abdominal pain Minimal ability to answer questions  Lab Results  Component Value Date   WBC 13.0 (H) 11/14/2017   HGB 7.2 (L) 11/14/2017   HCT 20.2 (L) 11/14/2017   MCV 81.5 11/14/2017   PLT 8 (LL) 11/14/2017    Lab Results  Component Value Date   CREATININE 1.90 (H) 11/14/2017   BUN 55 (H) 11/14/2017   NA 131 (L) 11/14/2017   K 3.4 (L) 11/14/2017   CL 104 11/14/2017   CO2 17 (L) 11/14/2017    Lab Results  Component Value Date   ALT 33 11/13/2017   AST 150 (H) 11/13/2017   ALKPHOS 202 (H) 11/13/2017     Microbiology: Recent Results (from the past 240 hour(s))  Culture, blood (routine x 2)     Status: None (Preliminary result)   Collection Time: 11/11/17  9:39 AM  Result Value Ref Range Status   Specimen Description BLOOD LEFT HAND  Final   Special Requests IN PEDIATRIC BOTTLE Blood Culture adequate volume  Final   Culture NO GROWTH 2 DAYS  Final   Report Status PENDING  Incomplete  Culture, blood (routine x 2)     Status: None (Preliminary result)   Collection Time: 11/11/17 10:00 AM  Result Value Ref Range Status   Specimen Description BLOOD LEFT HAND  Final   Special Requests IN PEDIATRIC BOTTLE Blood Culture adequate volume  Final   Culture NO GROWTH 2 DAYS  Final   Report Status PENDING  Incomplete     Impression/Plan:  1. HIV AIDS - on [place] and Descovy and did get yesterday.  Last CD4 only 20, was 30 previously, viral load from 11/9 still pending after 1 week. No changes  2.  Jaundice - unclear etiology but medications may be part.  I will stop ceftriaxone. Bili 4.3.  Alk phos up and abdominal pain.   May be developing ceftriaxone induced cholecystitis.  Other etiologies possible.    3.  Hematology - thrombocytopenia with anemia.  Continued worsening.  Parvovirus previously negative.  Negative from Bone marrow exam.   4.  Pneumonia - I will keep flagyl, stop ceftriaxone as above.    5.  Palliative - I discussed with the father and mother at the bedside that optimal care for him will be to focus on comfort.  He continues to moan and clearly declining.  No confirmation of comfort measures though at this time.    Dr. Tommy Medal on over the weekend if needed.

## 2017-11-14 NOTE — Progress Notes (Signed)
Nutrition Follow Up/Brief Note  Chart reviewed. Nutrition support (TPN and EN) has been discontinued. Pt's nutrition goal of care is comfort feeds.   Please reconsult as needed.   Maureen ChattersKatie Jerrell Hart, RD, LDN Pager #: [phone] After-Hours Pager #: [phone]

## 2017-11-14 NOTE — Progress Notes (Signed)
Hypoglycemic Event  CBG: 38  Treatment: D50 IV 50 mL  Symptoms: None  Follow-up CBG: Time: 2200 CBG Result: 116  Possible Reasons for Event: Inadequate meal intake  Comments/MD notified: patient unable to take oral intake due to being lethargic. New orders given: rate increase 150 ml/hr. CBGs changed to Q4.    Berton MountNiemela, Moishy Laday Rae

## 2017-11-15 DIAGNOSIS — Z515 Encounter for palliative care: Secondary | ICD-10-CM

## 2017-11-15 LAB — GLUCOSE, CAPILLARY
GLUCOSE-CAPILLARY: 106 mg/dL — AB (ref 65–99)
Glucose-Capillary: 105 mg/dL — ABNORMAL HIGH (ref 65–99)

## 2017-11-15 LAB — MAGNESIUM: Magnesium: 1.8 mg/dL (ref 1.7–2.4)

## 2017-11-15 MED ORDER — GLYCOPYRROLATE 0.2 MG/ML IJ SOLN
0.2000 mg | Freq: Four times a day (QID) | INTRAMUSCULAR | Status: DC | PRN
  Administered 2017-11-15: 0.2 mg via INTRAVENOUS

## 2017-11-15 MED ORDER — SODIUM CHLORIDE 0.9 % IV SOLN
INTRAVENOUS | Status: DC
  Administered 2017-11-15: 10 mL/h via INTRAVENOUS

## 2017-11-15 MED ORDER — HYDROMORPHONE HCL 1 MG/ML IJ SOLN
0.5000 mg | Freq: Once | INTRAMUSCULAR | Status: AC
  Administered 2017-11-15: 0.5 mg via INTRAVENOUS
  Filled 2017-11-15: qty 0.5

## 2017-11-15 MED ORDER — GLYCOPYRROLATE 0.2 MG/ML IJ SOLN
0.2000 mg | Freq: Four times a day (QID) | INTRAMUSCULAR | Status: DC
  Filled 2017-11-15 (×3): qty 1

## 2017-11-15 MED ORDER — HYDROMORPHONE HCL 1 MG/ML IJ SOLN
0.5000 mg | INTRAMUSCULAR | Status: DC | PRN
  Administered 2017-11-15 (×2): 0.5 mg via INTRAVENOUS
  Filled 2017-11-15 (×2): qty 0.5

## 2017-11-15 MED ORDER — HYDROMORPHONE HCL 1 MG/ML IJ SOLN
0.2500 mg | Freq: Four times a day (QID) | INTRAMUSCULAR | Status: DC
  Administered 2017-11-15: 0.25 mg via INTRAVENOUS
  Filled 2017-11-15: qty 0.5

## 2017-11-16 LAB — CULTURE, BLOOD (ROUTINE X 2)
Culture: NO GROWTH
Culture: NO GROWTH
SPECIAL REQUESTS: ADEQUATE
Special Requests: ADEQUATE

## 2017-11-24 LAB — HIV GENOSURE(R) MG

## 2017-11-24 LAB — HIV-1 RNA ULTRAQUANT REFLEX TO GENTYP+
HIV-1 RNA BY PCR: 680 copies/mL
HIV-1 RNA Quant, Log: 2.833 log10copy/mL

## 2017-11-24 LAB — REFLEX TO GENOSURE(R) MG

## 2017-11-29 NOTE — Progress Notes (Signed)
Daily Progress Note   Patient Name: Anthony Yang       Date: 12-10-2017 DOB: 02/28/1986  Age: 31 y.o. MRN#: [id number] Attending Physician: Lucy Antigua, MD Primary Care Physician: Patient, No Pcp Per Admit Date: 10/14/2017  Reason for Consultation/Follow-up: Establishing goals of care, Non pain symptom management, Pain control, Psychosocial/spiritual support and Terminal Care  Subjective: Pt is actively dying. Unresponsive except to noxious stimuli. He can no longer take PO's. No upper airway congestion noted but high risk for volume overload.Anasarca present. Mother and father at the bedside. He is moaning with each exhalation. Chart reviewed. Dilaudid given 0.5 mg x1 dose with relief of dyspnea. Both father and mother verbalize that he appeared more comfortable after dilaudid.  Length of Stay: 23  Current Medications: Scheduled Meds:  . emtricitabine-tenofovir AF  1 tablet Oral Daily  . feeding supplement (ENSURE ENLIVE)  237 mL Oral TID BM  .  HYDROmorphone (DILAUDID) injection  0.25 mg Intravenous Q6H  . sodium chloride flush  10-40 mL Intracatheter Q12H  . sulfamethoxazole-trimethoprim  20 mL Oral Daily    Continuous Infusions: . D-10-0.45% Sodium Chloride with KCL 40 meq/L 1000 ml 150 mL/hr at 12/10/17 0442  . metronidazole Stopped (12/10/2017 0451)  .  sodium bicarbonate (isotonic) infusion in sterile water 50 mL/hr at Dec 10, 2017 0442    PRN Meds: albuterol, Gerhardt's butt cream, HYDROmorphone (DILAUDID) injection, loperamide, LORazepam, ondansetron (ZOFRAN) IV, phenol, prochlorperazine, RESOURCE THICKENUP CLEAR, sodium chloride flush  Physical Exam  Constitutional:  Cachetic, acutely ill young man. He appears to be actively dying anasarca  HENT:  Head: Normocephalic  and atraumatic.  Temporal wasting  Cardiovascular:  hypotensive  Pulmonary/Chest:  Increased work of breathing at rest  Genitourinary:  Genitourinary Comments: foley  Neurological:  unresponsive  Skin: Skin is warm and dry.  Nursing note and vitals reviewed.           Vital Signs: BP (!) 75/36   Pulse (!) 110   Temp 98.6 F (37 C) (Oral)   Resp 18   Ht 5\' 6"  (1.676 m)   Wt 66.2 kg (146 lb)   SpO2 99%   BMI 23.57 kg/m  SpO2: SpO2: 99 % O2 Device: O2 Device: Nasal Cannula O2 Flow Rate: O2 Flow Rate (L/min): 4 L/min  Intake/output summary:   Intake/Output Summary (  Last 24 hours) at 11/03/2017 0920 Last data filed at 11/14/2017 0449 Gross per 24 hour  Intake 3455 ml  Output 330 ml  Net 3125 ml   LBM: Last BM Date: 11/14/17 Baseline Weight: Weight: 53.1 kg (117 lb) Most recent weight: Weight: 66.2 kg (146 lb)       Palliative Assessment/Data:      Patient Active Problem List   Diagnosis Date Noted  . Hypoglycemia without diagnosis of diabetes mellitus 11/13/2017  . Acute urinary retention 11/13/2017  . Elevated LFTs 11/12/2017  . Goals of care, counseling/discussion   . Palliative care encounter   . Pressure injury of skin 11/09/2017  . Anemia in other chronic diseases classified elsewhere 11/02/2017  . AIDS-associated secretory diarrhea (HCC)   . Thrombocytopenia (HCC) 10/29/2017  . Chronic diarrhea   . Dehydration 11/16/17  . Human immunodeficiency virus (HIV) disease (HCC)   . Dyspnea   . Thrush   . Protein-calorie malnutrition, severe 09/27/2017  . Sepsis, unspecified organism (HCC) 09/25/2017  . Symptomatic anemia 09/25/2017  . Hyponatremia 09/25/2017  . History of GI bleed 09/25/2017  . AIDS (acquired immune deficiency syndrome) (HCC) 09/25/2017  . Non-compliance 09/25/2017    Palliative Care Assessment & Plan   Patient Profile: 31 y.o.malewith past medical history of HIV/AIDs (CD4 28) diagnosed 2013 but off ART for 2 years until  10/01/2017, recently treated for Giardia, suspected MAI, severe anemia during admission 9/27-10/4/18 and nowadmitted on11/18/18with severe dehydration, hyponatremia, hypokalemia.He has experienced severe weight loss, malnutrition and weakness. He has continued to decline this hospitalization and unable to tolerate intake. Hospitalization also complicated by aspiration pneumonia sepsis.   Palliative care requested to assist with GOC given poor prognosis and disposition.Pt is now actively dying   Assessment: Updated mother and father at the bedside. Agreeable to DC Fentanyl and start scheduled dilaudid. Also began discussion regarding IVF as well as abx and antifungal RX not providing curative tx but may contribute to symptom burden 2/2 volume overload. Family stressing that they "do not want him to suffer". Father did ask me "how long does he have". I did tell then in my opinion he had hours to days barring an acute event.  Recommendations/Plan:  Recommend stopping abx and antifungal as well as reducing IVF at least to 75/hr. Family needs help with this decision.Spoke to Dr. Darnelle Catalanama  Start scheduled dilaudid 0.25 q6 ATC and 0.5mg  q2 PRN pain and dyspnea.  Will stop PO meds as he can no longer swallow  Goals of Care and Additional Recommendations:  Limitations on Scope of Treatment: Minimize Medications, No Artificial Feeding, No Blood Transfusions, No Chemotherapy, No Diagnostics, No Hemodialysis, No Lab Draws, No Radiation, No Surgical Procedures and No Tracheostomy  Code Status:    Code Status Orders  (From admission, onward)        Start     Ordered   11/10/17 2109  Do not attempt resuscitation (DNR)  Continuous    Question Answer Comment  In the event of cardiac or respiratory ARREST Do not call a "code blue"   In the event of cardiac or respiratory ARREST Do not perform Intubation, CPR, defibrillation or ACLS   In the event of cardiac or respiratory ARREST Use medication by  any route, position, wound care, and other measures to relive pain and suffering. May use oxygen, suction and manual treatment of airway obstruction as needed for comfort.      11/10/17 2109    Code Status History    Date Active  Date Inactive Code Status Order ID Comments User Context   10/10/2017 18:54 11/10/2017 21:09 Full Code [id number]  Shon HaleEmokpae, Courage, MD Inpatient   09/25/2017 00:41 10/02/2017 21:44 Full Code [id number]  Briscoe Deutscherpyd, Timothy S, MD ED       Prognosis:   Hours - Days  Discharge Planning:  Anticipated Hospital Death  Care plan was discussed with Dr. Darnelle Catalanama  Thank you for allowing the Palliative Medicine Team to assist in the care of this patient.   Time In: 0850 Time Out: 0930 Total Time 40 min Prolonged Time Billed  no       Greater than 50%  of this time was spent counseling and coordinating care related to the above assessment and plan.  Irean HongSarah Grace Haruki Arnold, NP  Please contact Palliative Medicine Team phone at [phone] for questions and concerns.

## 2017-11-29 NOTE — Progress Notes (Signed)
Progress Note    Anthony Yang  GNF:[id number] DOB: May 17, 1986  DOA: 10/27/2017 PCP: Patient, No Pcp Per    Brief Narrative:   Chief complaint: Follow-up aspiration pneumonia  Medical records reviewed and are as summarized below:  Anthony Yang is an 31 y.o. male with a PMH of HIV/AIDS, last CD4 count 28, who was admitted 10/11/2017 from the ID clinic for treatment of persistent diarrhea complicated by dehydration with associated 50 pound weight loss over 6 months in the setting of recent treatment for Giardia. Upon presentation, he had significant electrolyte abnormalities including hyponatremia and hypokalemia. He has had a prolonged hospital stay due to generalized failure to thrive and poor nutritional status, requiring treatment with TPN. Hospital course further complicated by the development of aspiration pneumonia/sepsis. Family at this point has agreed to DO NOT RESUSCITATE status. Poor prognosis discussed with family and palliative care has been consulted for goals of care.  Assessment/Plan:   Principal Problem: Sepsis due to aspiration pneumonia, not present on admission/dysphasia Initially treated with Levaquin/vancomycin, stress dose steroids. Vancomycin discontinued 11/12/17, and Levaquin stopped 11/13/17 secondary to hypoglycemia. Started on Rocephin/Flagyl as an alternative. Now, Rocephin discontinued due to concerns regarding elevated LFTs. Overall prognosis remains poor given severe immune suppression. Patient's family agrees for bipap if needed but no invasive mechanical ventilation. Chest films show infiltrates on the right lower lobe and left upper lobe. Evaluated by speech therapist and placed on a dysphagia 2 diet with nectar thickened liquids. Continues to deteriorate and has become nonverbal per nursing notes. Was tachypneic and using accessory muscles overnight, and breathing has entered agonal phase on exam this morning. Discontinue all antibiotics and transition to  comfort care.  Active problems:  Urinary retention Coud Foley catheter placed 11/13/17.   Hypoglycemia KVO IV fluid and avoid further CBG checks.  Elevated LFTs No history of viral hepatitis. Liver appeared normal in size and contour on CT done 11/06/17, but there was periportal edema and possible mild sludge within the gallbladder lumen. Pancreas was unremarkable. AST/bilirubin are rising. Again, prognosis poor with an anticipated in hospital death.  Hypovolemia due to significant GI losses He is third spacinging and is extremely malnourished. KVO IV fluids and transition to comfort care.  Acute on chronic anemia plus thrombocytopenia Continues with worsening anemia, and thrombocytopenia. There are no signs of hemolysis and fibrinogen is not low, not suggestive of DIC. Bone marrow is failing, likely from HIV. No further lab draws or transfusions.  AKI with hyponatremia/hypokalemia/ hypocalcemia, with non gap metabolic alkalosis Renal function continues to deteriorate. Electrolyte derangements are likely due to diarrhea. No further lab draws or electrolyte replacement.  AIDS associated secretory diarrhea RN reports ongoing frequent loose stools.   HIV-AIDS Stop antiretrovirals. Lengthy discussion held with the patient's parents regarding his current disease state and the fact that he is now showing signs of actively dying. Family is sad, but is accepting of his prognosis at this point. Transition to comfort care.  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 Has been on TPN. Currently not on any supplemental nutritional support.  Hypokalemia Discontinue IV fluids and potassium supplementation.   Family Communication/Anticipated D/C date and plan/Code Status   DVT prophylaxis: Anticoagulation is contraindicated given significant thrombocytopenia. Code Status: DO NOT RESUSCITATE.  Family Communication: Parents updated bedside. Disposition  Plan: Likely will experience in hospital death. Patient actively dying.  Medical Consultants:    Palliative Care  Critical Care  Gastroenterology  Infectious  Disease   Anti-Infectives:    Vancomycin 11/11/17---> 11/12/17  Levaquin 11/10/17---> 11/13/17  Rocephin 11/13/17---> 11/14/17  Flagyl 11/13/17--->  Albenza 10/28/17--->10/29/17  Subjective:   Unresponsive with agonal respirations and groaning on expiration.  Objective:    Vitals:   11/14/17 2100 11/14/17 2200 11/14/17 2300 11/10/2017 0400  BP:    (!) 81/40  Pulse: (!) 110 (!) 108 (!) 108 (!) 111  Resp: (!) 32 (!) 23 (!) 21 20  Temp:    98.6 F (37 C)  TempSrc:    Oral  SpO2: 97% 99% 100% 99%  Weight:      Height:        Intake/Output Summary (Last 24 hours) at 11/21/2017 0820 Last data filed at 11/21/2017 0449 Gross per 24 hour  Intake 3455 ml  Output 330 ml  Net 3125 ml   Filed Weights   11/12/17 0500 11/13/17 0400 11/14/17 0444  Weight: 65.5 kg (144 lb 6.4 oz) 68.5 kg (151 lb 0.2 oz) 66.2 kg (146 lb)    Exam:  General: Cachectic, chronically ill-appearing, actively dying. Cardiovascular: Tachycardic with a hyperdynamic precordium. No gallops or rubs. No murmurs. No JVD. Lungs: Agonal respirations with some scattered rhonchi. Abdomen: Soft, nontender, nondistended with normal active bowel sounds. No masses. +splenomegaly. Skin: Warm and dry. No rashes or lesions. Extremities: +clubbing, no cyanosis. 2+ edema. Pedal pulses 2+.  Data Reviewed:   I have personally reviewed following labs and imaging studies:  Labs: Labs show the following:   Basic Metabolic Panel: Recent Labs  Lab 11/09/17 0456 11/11/17 0410 11/12/17 0439 11/13/17 0359 11/14/17 0429 11/14/2017 0412  NA 134* 135 133* 136 131*  --   K 3.4* 3.5 3.4* 3.9 3.4*  --   CL 109 111 109 111 104  --   CO2 21* 19* 16* 17* 17*  --   GLUCOSE 130* 39* 241* 65 70  --   BUN 19 34* 42* 51* 55*  --   CREATININE 0.79 1.11 1.41*  1.63* 1.90*  --   CALCIUM 6.0* 5.8* 5.6* 5.4* 5.0*  --   MG 1.8 1.9 2.0 1.8 1.8 1.8  PHOS 1.9*  --   --   --   --   --    GFR Estimated Creatinine Clearance: 50.8 mL/min (A) (by C-G formula based on SCr of 1.9 mg/dL (H)). Liver Function Tests: Recent Labs  Lab 11/09/17 0456 11/13/17 0359  AST 123* 150*  ALT 26 33  ALKPHOS 226* 202*  BILITOT 1.3* 4.3*  PROT 3.7* 3.6*  ALBUMIN <1.0* <1.0*   CBC: Recent Labs  Lab 11/10/17 0749 11/11/17 0410 11/12/17 0439 11/13/17 0359 11/14/17 0429  WBC 13.7* 14.2* 12.6* 16.4* 13.0*  NEUTROABS 5.6 8.0* 6.3  --   --   HGB 7.9* 7.0* 5.7* 8.6* 7.2*  HCT 22.8* 20.1* 16.7* 24.8* 20.2*  MCV 85.1 82.0 81.1 81.8 81.5  PLT 28* 16* 12* 11* 8*   CBG: Recent Labs  Lab 11/14/17 2135 11/14/17 2203 11/14/17 2233 11/14/17 2356 10/30/2017 0401  GLUCAP 38* 116* 96 107* 106*    Microbiology Recent Results (from the past 240 hour(s))  Culture, blood (routine x 2)     Status: None (Preliminary result)   Collection Time: 11/11/17  9:39 AM  Result Value Ref Range Status   Specimen Description BLOOD LEFT HAND  Final   Special Requests IN PEDIATRIC BOTTLE Blood Culture adequate volume  Final   Culture NO GROWTH 3 DAYS  Final   Report Status PENDING  Incomplete  Culture, blood (routine x 2)     Status: None (Preliminary result)   Collection Time: 11/11/17 10:00 AM  Result Value Ref Range Status   Specimen Description BLOOD LEFT HAND  Final   Special Requests IN PEDIATRIC BOTTLE Blood Culture adequate volume  Final   Culture NO GROWTH 3 DAYS  Final   Report Status PENDING  Incomplete    Procedures and diagnostic studies:  11/11/17: Dg Chest Port 1 View:  Persistent densities in the mid and lower left chest. Findings are most compatible with pleural fluid and consolidation. Minimal change from the recent comparison examination. Perihilar densities, left side greater than right. Perihilar densities are nonspecific.   11/11/17: Dg Swallowing  Func-speech Pathology: Diet Recommendations Dysphagia 2 (Fine chop) solids;Nectar thick liquid.  11/10/17: Dg Chest Port 1 View:  Extensive left lower lobe airspace consolidation with left pleural effusion. Widespread pulmonary edema appearance. Suspect widespread interstitial pneumonitis, likely of infectious etiology. Edema due to cardiac failure is possible but felt to be less likely given appearance of the cardiac silhouette.     Medications:   . baclofen  5 mg Oral TID  . dolutegravir  50 mg Oral Daily  . emtricitabine-tenofovir AF  1 tablet Oral Daily  . feeding supplement (ENSURE ENLIVE)  237 mL Oral TID BM  . magnesium oxide  400 mg Oral TID  . multivitamin  5 mL Oral Daily  . sodium chloride flush  10-40 mL Intracatheter Q12H  . sulfamethoxazole-trimethoprim  20 mL Oral Daily   Continuous Infusions: . D-10-0.45% Sodium Chloride with KCL 40 meq/L 1000 ml 150 mL/hr at 04/26/17 0442  . metronidazole Stopped (04/26/17 0451)  .  sodium bicarbonate (isotonic) infusion in sterile water 50 mL/hr at 04/26/17 0442     LOS: 23 days    Time-based visit: 35 minutes spent with the patient and his family discussing my clinical impression that Anthony Yang is actively dying. After lengthy discussion, family in agreement with transitioning to comfort care.  Trula OreChristina Rama  [facility] Pager [phone]. If unable to reach me by pager, please call my cell phone at [phone].  *Please refer to [email or web address], password [email or web address] to get updated schedule on who will round on this patient, as hospitalists switch teams weekly. If 7PM-7AM, please contact night-coverage at [email or web address], password [email or web address] for any overnight needs.  2017-03-15, 8:20 AM

## 2017-11-29 NOTE — Plan of Care (Signed)
  Health Behavior/Discharge Planning: Ability to manage health-related needs will improve 08/20/17 0046 - Not Progressing by Jill SideNiemela, Nargis Abrams R, RN   Pain Managment: General experience of comfort will improve 08/20/17 0046 - Not Progressing by Jill SideNiemela, Giovonnie Trettel R, RN   Nutrition: Adequate nutrition will be maintained 08/20/17 0046 - Not Progressing by Jill SideNiemela, Ura Hausen R, RN

## 2017-11-29 NOTE — Progress Notes (Signed)
Pt found unresponsive, no respirations, and pulseless, verified with Dereck LigasSandra Burton, RN.  Notified Attending Physician Hillery Aldohristina Rama, MD.  Post-mortem checklist initiated.  CDS notified.

## 2017-11-29 NOTE — Progress Notes (Signed)
Pt mortem care completed. Pt transported to the morgue. Pt placement contacted. No belongings left in the room.

## 2017-11-29 NOTE — Progress Notes (Signed)
Patient has declined and has become nonverbal. Patient unable to take anything orally. Spoke with family about making the decision to make this patient comfort care. Received order for one time dose of 0.5 mg dilaudid. Patient's respirations have been 25-40 and using accessory muscles prior to dilaudid. Patient's respirations are now 20 after dilaudid dose.   Will let day shift know of current status and to address the need of comfort care to the family. Will continue to monitor

## 2017-11-29 NOTE — Discharge Summary (Addendum)
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 male with a PMH of HIV/AIDS, last CD4 count 28, who was admitted 2017/10/12 from the ID clinic for treatment of persistent diarrhea complicated by dehydration with associated 50 pound weight loss over 6 months in the setting of recent treatment for Giardia. Upon presentation, he had significant electrolyte abnormalities including hyponatremia and hypokalemia. He has had a prolonged hospital stay due to generalized failure to thrive and poor nutritional status, requiring treatment with TPN. Hospital course further complicated by the development of aspiration pneumonia/sepsis. Family at this point has agreed to DO NOT RESUSCITATE status. Poor prognosis discussed with family and palliative care has been consulted for goals of care.   Hospital Course:   Despite being placed on anti-retroviral therapy, the patient's immune system showed no signs of recovery and he developed multi-organ failure including bone marrow failure, renal, liver as well as pancreatic failure with recurrent problems with hypoglycemia. Initially treated with antibiotics,  continuous dextrose infusion and blood transfusion support. Chest films showed infiltrates on the right lower lobe and left upper lobe and therapy was targeted to cover hospital-acquired pathogens. The decision was made by the patient's family to pursue full comfort measures on 10/31/2017 after he became unresponsive and developed agonal respirations and once IV fluids were discontinued, the patient rapidly deteriorated. He was provided with when necessary Dilaudid to treat air hunger/pain. His condition rapidly deteriorated once IV fluids were discontinued and he passed with parents at the bedside peacefully.   Signed:  Trula OreChristina Rama  [facility] 11/11/2017, 4:48 PM

## 2017-11-29 DEATH — deceased

## 2018-02-26 IMAGING — DX DG CHEST 1V PORT
1 series · 1 of 1 positions shown · non-contrast
Comparison: 10/23/2017

CLINICAL DATA: Sepsis

EXAM:
PORTABLE CHEST 1 VIEW

[chest ap]
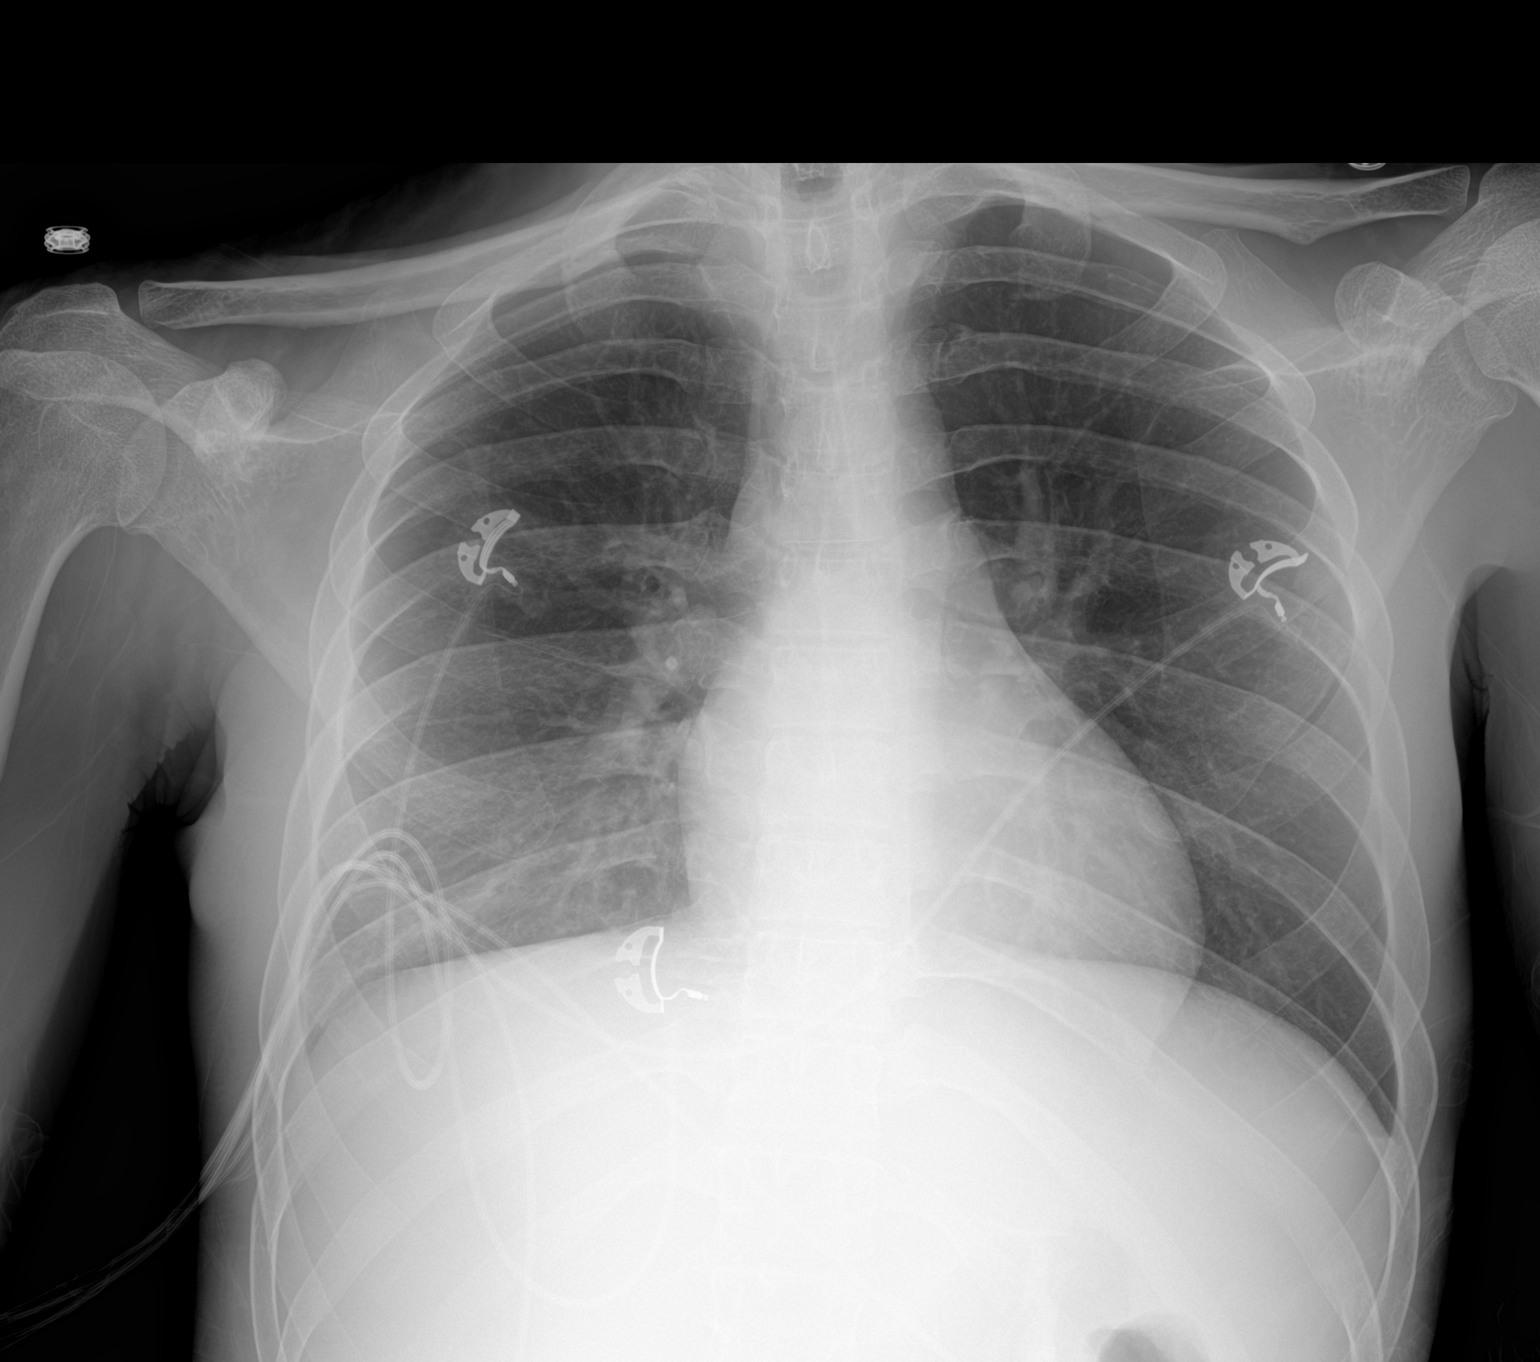

[1 of 1 positions shown; findings below may reference images not displayed]

FINDINGS: Mild right infrahilar atelectasis or infiltrate. Left lung is clear.
Normal heart size. No pneumothorax.
IMPRESSION: Mild right infrahilar atelectasis or mild infiltrate.

## 2018-03-02 IMAGING — DX DG ABD PORTABLE 1V
1 series · 1 of 1 positions shown · non-contrast
Comparison: CT 10/27/2017

CLINICAL DATA: Nausea and vomiting.  Twelve placement.

EXAM:
PORTABLE ABDOMEN - 1 VIEW

[abdomen kub]
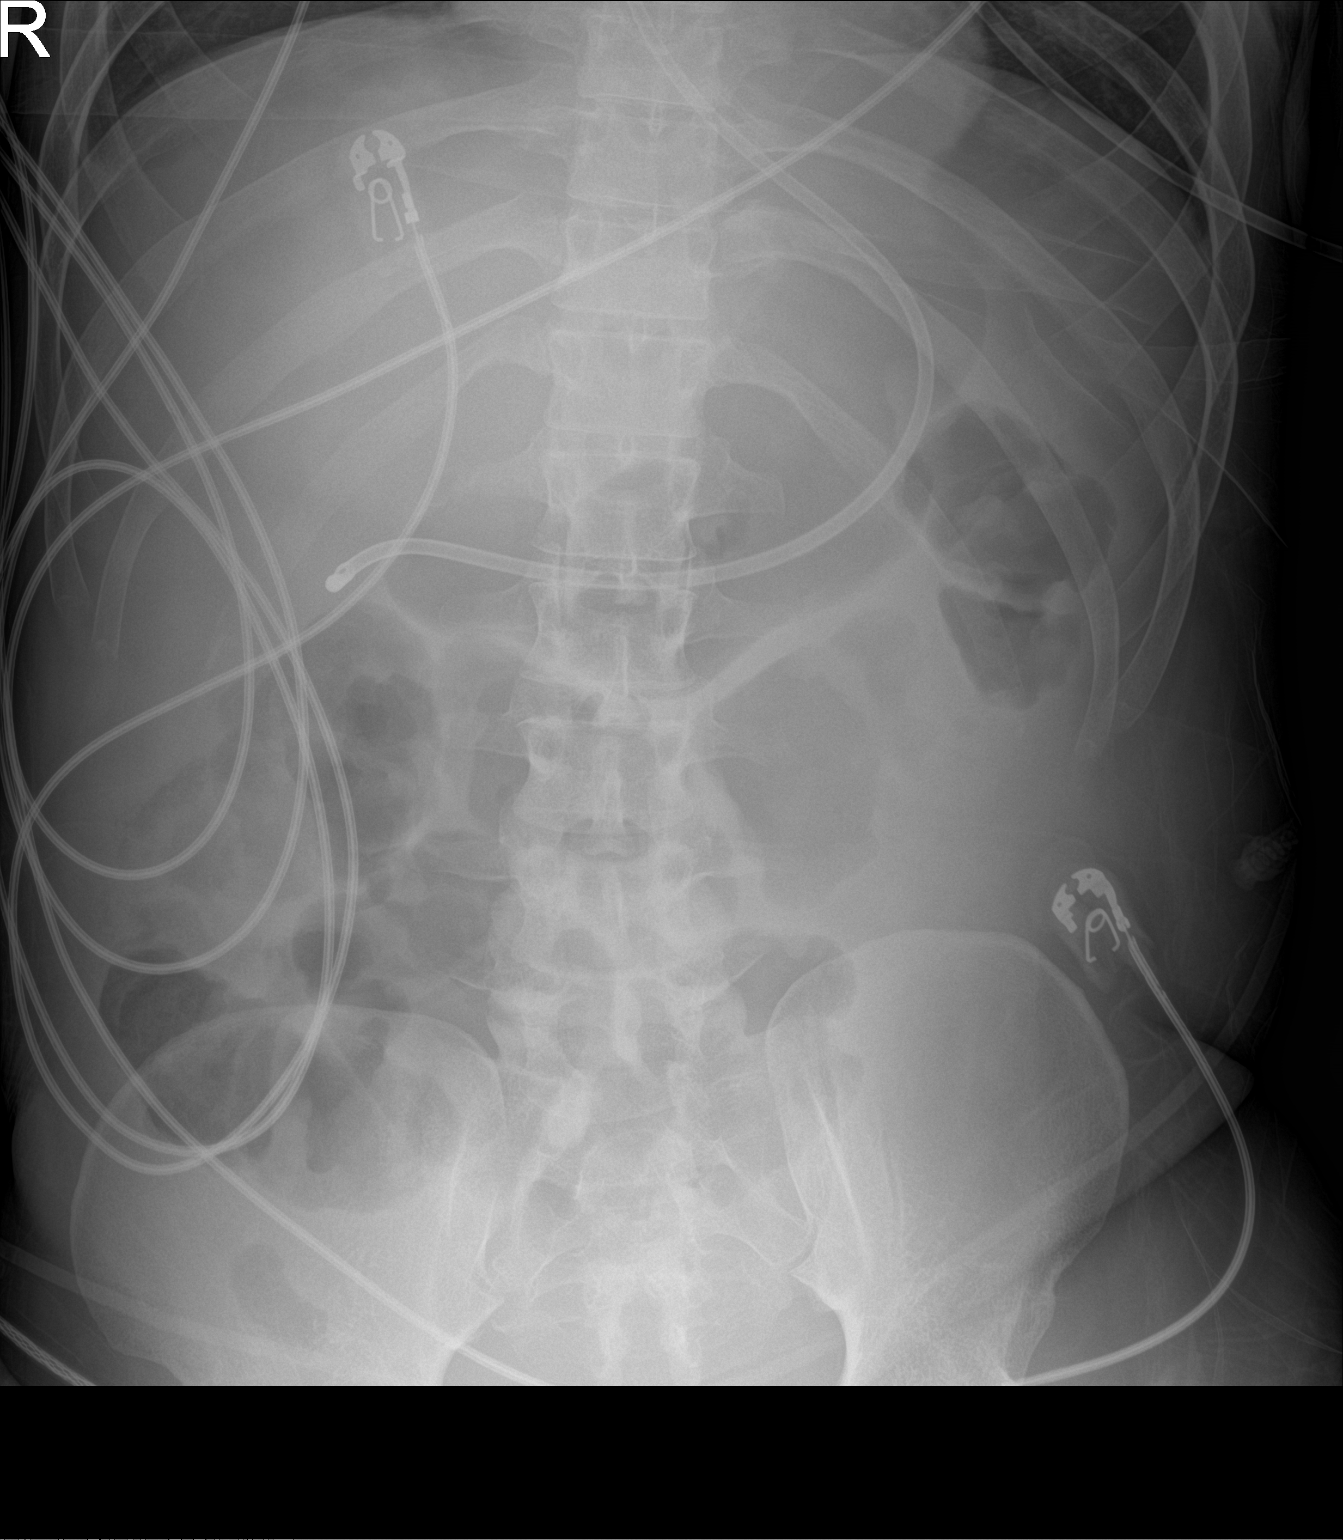

[1 of 1 positions shown; findings below may reference images not displayed]

FINDINGS: Tip of the weighted enteric tube in the right upper quadrant of the
abdomen likely in the first portion of the duodenum. Air within
nondilated stomach, small and large bowel. No evidence of free air.
IMPRESSION: Tip of the weighted enteric tube in the right upper quadrant of the
abdomen likely in the proximal duodenum.

## 2018-03-09 IMAGING — CT CT ABD-PELV W/ CM
2 of 5 series · 11 of 46 positions shown, 12 images · IV contrast (Omni 300)
Comparison: CT abdomen pelvis 10/27/2017.

CLINICAL DATA: Patient with nausea and vomiting. Bowel obstruction.

EXAM:
CT ABDOMEN AND PELVIS WITH CONTRAST
TECHNIQUE: Multidetector CT imaging of the abdomen and pelvis was performed
using the standard protocol following bolus administration of
intravenous contrast.
CONTRAST:  100mL [id number] IOPAMIDOL ([id number]) INJECTION 61%

[Series 3: a/p w/ 5mm · axial · 0.53mm/px · z∈[-194,+186]mm · 8 of 96 slices shown, 9 images]
[im 10/96  soft-tissue]
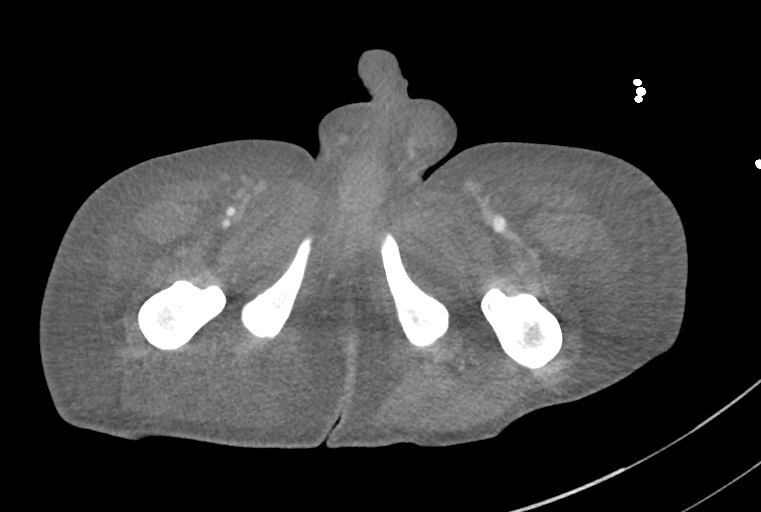
[im 10/96  bone]
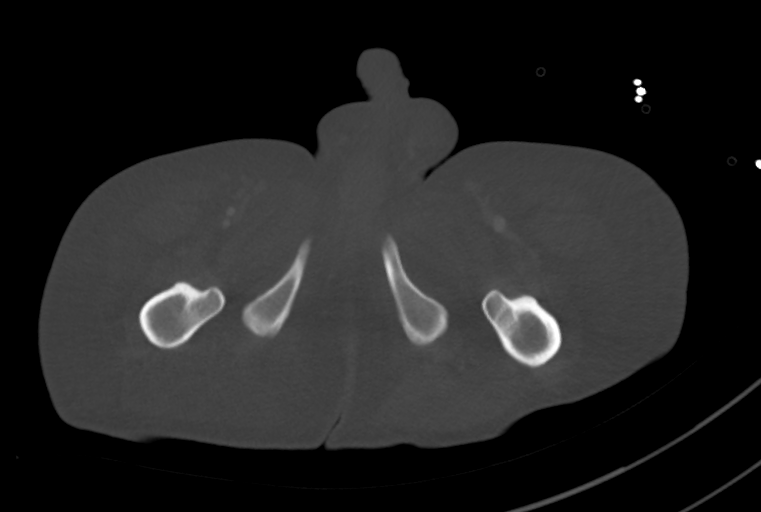
[im 20/96  soft-tissue]
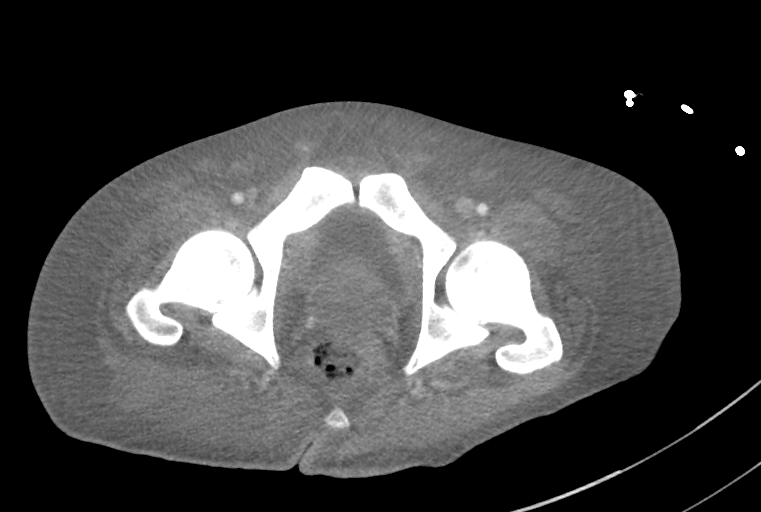
[im 29/96  soft-tissue]
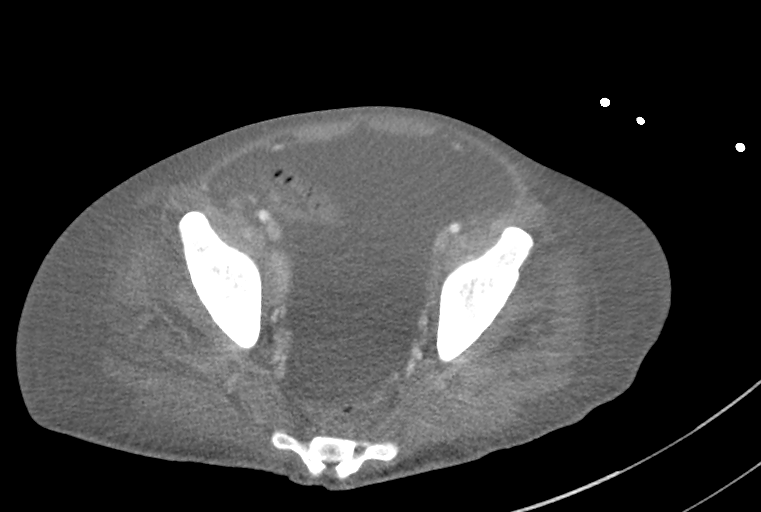
[im 43/96  soft-tissue]
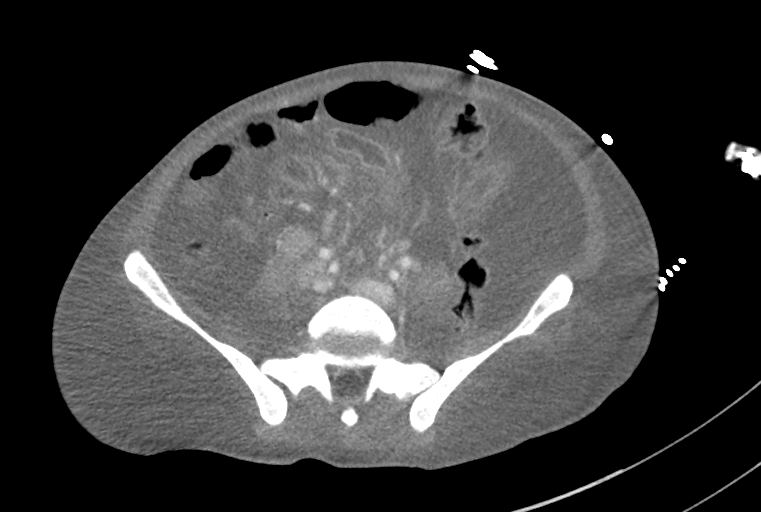
[im 53/96  soft-tissue]
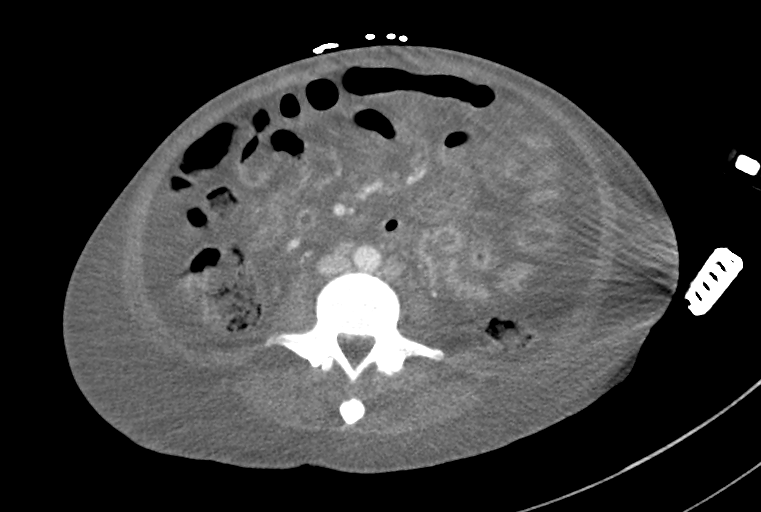
[im 67/96  soft-tissue]
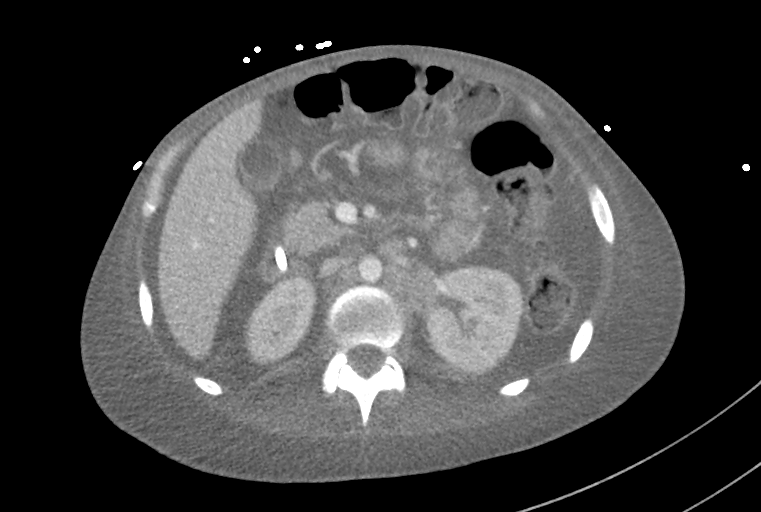
[im 77/96  soft-tissue]
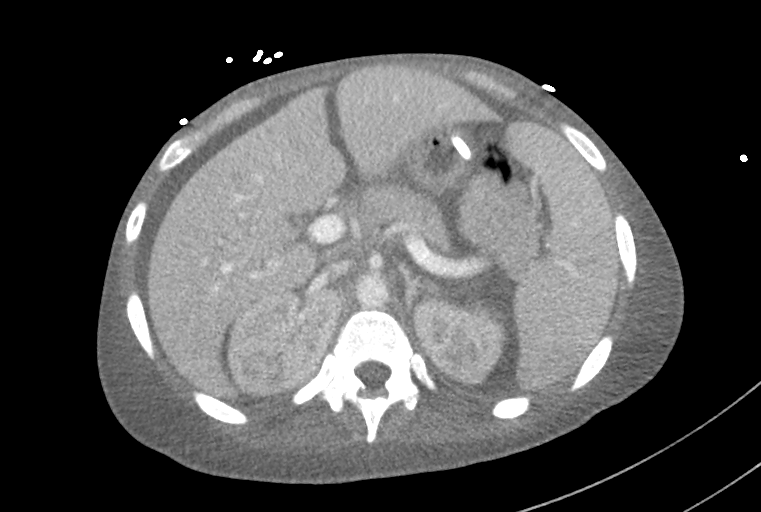
[im 86/96  soft-tissue]
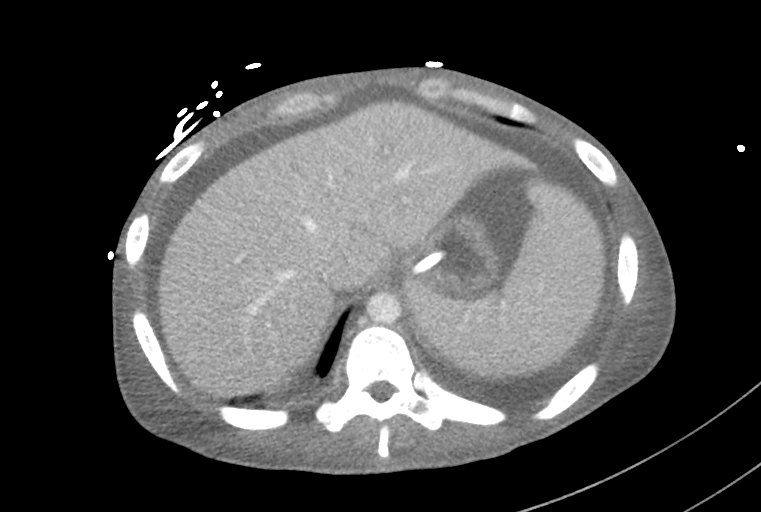

[Series 6: a/p w/ cor · coronal · 0.76mm/px · 3 of 133 slices shown]
[im 45/133  soft-tissue]
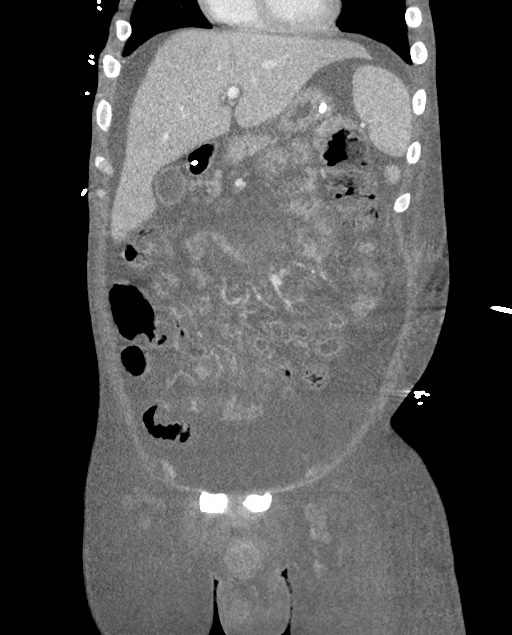
[im 59/133  soft-tissue]
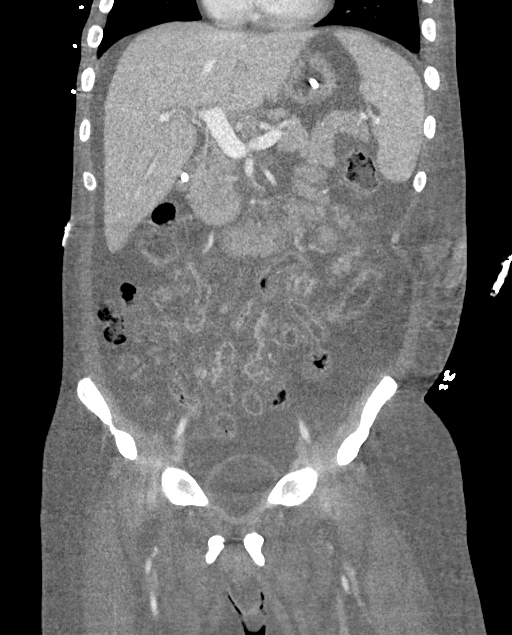
[im 74/133  soft-tissue]
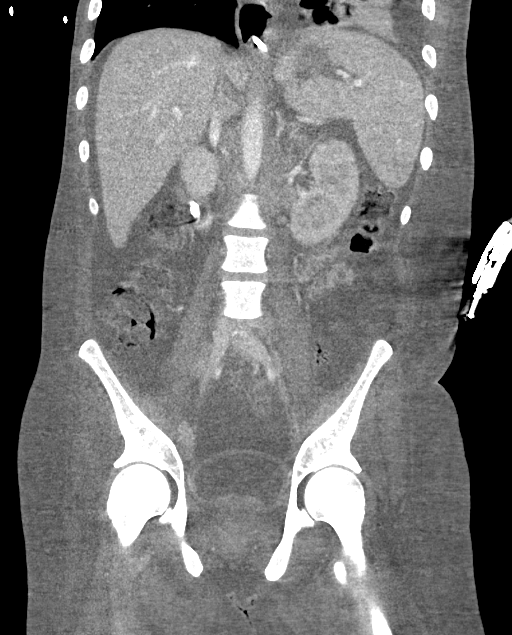

[11 of 46 positions shown; findings below may reference images not displayed]

FINDINGS: Lower chest: Normal heart size. Moderate left pleural effusion.
Subpleural consolidation within the lower lobes bilaterally,
left-greater-than-right, potentially representing atelectasis.

Hepatobiliary: The liver is normal in size and contour. Periportal
edema. Possible mild sludge within the gallbladder lumen.

Pancreas: Unremarkable

Spleen: Splenomegaly measuring 14 cm.

Adrenals/Urinary Tract: Adrenal glands are normal. Kidneys enhance
symmetrically with contrast. Urinary bladder is unremarkable.

Stomach/Bowel: Enteric tube terminates in the descending duodenum.
Normal morphology of the stomach. No evidence for bowel obstruction.
No free intraperitoneal air. Small bowel wall thickening.

Vascular/Lymphatic: Normal caliber abdominal aorta. Interval
increase in left periaortic adenopathy measuring 2.6 cm (image 34;
series 3), previously 2.0 cm. Increase in right external iliac
adenopathy measuring 2.1 cm (image 54; series 3), previously 1.6 cm.

Reproductive: Unremarkable.

Other: Moderate volume ascites.  Re- demonstrated mesenteric edema.

Musculoskeletal: No aggressive or acute appearing osseous lesions.
IMPRESSION: 1. Re- demonstrated moderate volume ascites, mesenteric edema and
diffuse anasarca.
2. Moderate left pleural effusion. Resolution of previously
visualized right pleural effusion.
3. Interval increase in retroperitoneal and pelvic lymphadenopathy
which is nonspecific and may be malignant or infectious in etiology.
4. Small bowel wall thickening is nonspecific however likely
secondary to third spacing of fluid.
